# Patient Record
Sex: Female | Born: 1960
Health system: Southern US, Community
[De-identification: ages and names within clinical notes are randomized; demographics above are authoritative.]

## PROBLEM LIST (undated history)

## (undated) DIAGNOSIS — F419 Anxiety disorder, unspecified: Secondary | ICD-10-CM

## (undated) DIAGNOSIS — S060X9A Concussion with loss of consciousness of unspecified duration, initial encounter: Secondary | ICD-10-CM

## (undated) DIAGNOSIS — G259 Extrapyramidal and movement disorder, unspecified: Secondary | ICD-10-CM

## (undated) DIAGNOSIS — G35 Multiple sclerosis: Secondary | ICD-10-CM

## (undated) DIAGNOSIS — S060XAA Concussion with loss of consciousness status unknown, initial encounter: Secondary | ICD-10-CM

## (undated) DIAGNOSIS — Z923 Personal history of irradiation: Secondary | ICD-10-CM

## (undated) DIAGNOSIS — Z800 Family history of malignant neoplasm of digestive organs: Secondary | ICD-10-CM

## (undated) DIAGNOSIS — Z803 Family history of malignant neoplasm of breast: Secondary | ICD-10-CM

## (undated) DIAGNOSIS — F32A Depression, unspecified: Secondary | ICD-10-CM

## (undated) DIAGNOSIS — M797 Fibromyalgia: Secondary | ICD-10-CM

## (undated) DIAGNOSIS — T4145XA Adverse effect of unspecified anesthetic, initial encounter: Secondary | ICD-10-CM

## (undated) DIAGNOSIS — G35D Multiple sclerosis, unspecified: Secondary | ICD-10-CM

## (undated) DIAGNOSIS — G629 Polyneuropathy, unspecified: Secondary | ICD-10-CM

## (undated) DIAGNOSIS — C50919 Malignant neoplasm of unspecified site of unspecified female breast: Secondary | ICD-10-CM

## (undated) DIAGNOSIS — Z8041 Family history of malignant neoplasm of ovary: Secondary | ICD-10-CM

## (undated) DIAGNOSIS — H539 Unspecified visual disturbance: Secondary | ICD-10-CM

## (undated) DIAGNOSIS — T8859XA Other complications of anesthesia, initial encounter: Secondary | ICD-10-CM

## (undated) DIAGNOSIS — M810 Age-related osteoporosis without current pathological fracture: Secondary | ICD-10-CM

## (undated) DIAGNOSIS — F329 Major depressive disorder, single episode, unspecified: Secondary | ICD-10-CM

## (undated) HISTORY — DX: Unspecified visual disturbance: H53.9

## (undated) HISTORY — DX: Family history of malignant neoplasm of breast: Z80.3

## (undated) HISTORY — DX: Family history of malignant neoplasm of digestive organs: Z80.0

## (undated) HISTORY — DX: Depression, unspecified: F32.A

## (undated) HISTORY — DX: Anxiety disorder, unspecified: F41.9

## (undated) HISTORY — DX: Family history of malignant neoplasm of ovary: Z80.41

## (undated) HISTORY — PX: FOOT SURGERY: SHX648

## (undated) HISTORY — DX: Major depressive disorder, single episode, unspecified: F32.9

## (undated) HISTORY — PX: KNEE ARTHROSCOPY: SUR90

## (undated) HISTORY — DX: Extrapyramidal and movement disorder, unspecified: G25.9

## (undated) HISTORY — DX: Polyneuropathy, unspecified: G62.9

## (undated) HISTORY — PX: BREAST LUMPECTOMY: SHX2

## (undated) HISTORY — PX: FRACTURE SURGERY: SHX138

---

## 2000-07-09 ENCOUNTER — Other Ambulatory Visit: Admission: RE | Admit: 2000-07-09 | Discharge: 2000-07-09 | Payer: Self-pay | Admitting: Obstetrics and Gynecology

## 2001-11-25 ENCOUNTER — Other Ambulatory Visit: Admission: RE | Admit: 2001-11-25 | Discharge: 2001-11-25 | Payer: Self-pay | Admitting: Obstetrics and Gynecology

## 2002-12-08 ENCOUNTER — Other Ambulatory Visit: Admission: RE | Admit: 2002-12-08 | Discharge: 2002-12-08 | Payer: Self-pay | Admitting: Obstetrics & Gynecology

## 2003-08-17 ENCOUNTER — Encounter
Admission: RE | Admit: 2003-08-17 | Discharge: 2003-08-17 | Payer: Self-pay | Admitting: Physical Medicine and Rehabilitation

## 2003-09-13 ENCOUNTER — Encounter: Admission: RE | Admit: 2003-09-13 | Discharge: 2003-09-13 | Payer: Self-pay | Admitting: Neurology

## 2004-07-14 ENCOUNTER — Encounter: Admission: RE | Admit: 2004-07-14 | Discharge: 2004-10-12 | Payer: Self-pay | Admitting: Neurology

## 2004-10-11 ENCOUNTER — Encounter: Admission: RE | Admit: 2004-10-11 | Discharge: 2004-10-11 | Payer: Self-pay | Admitting: Neurology

## 2005-01-20 ENCOUNTER — Encounter: Admission: RE | Admit: 2005-01-20 | Discharge: 2005-01-20 | Payer: Self-pay | Admitting: Family Medicine

## 2005-07-27 ENCOUNTER — Encounter: Admission: RE | Admit: 2005-07-27 | Discharge: 2005-07-27 | Payer: Self-pay | Admitting: Endocrinology

## 2005-08-07 ENCOUNTER — Encounter: Admission: RE | Admit: 2005-08-07 | Discharge: 2005-08-07 | Payer: Self-pay | Admitting: Orthopedic Surgery

## 2005-08-25 ENCOUNTER — Encounter: Admission: RE | Admit: 2005-08-25 | Discharge: 2005-08-25 | Payer: Self-pay | Admitting: Neurology

## 2007-05-29 ENCOUNTER — Encounter: Admission: RE | Admit: 2007-05-29 | Discharge: 2007-05-29 | Payer: Self-pay | Admitting: Specialist

## 2009-10-18 ENCOUNTER — Encounter: Admission: RE | Admit: 2009-10-18 | Discharge: 2009-10-18 | Payer: Self-pay | Admitting: Family Medicine

## 2010-02-03 ENCOUNTER — Encounter
Admission: RE | Admit: 2010-02-03 | Discharge: 2010-02-03 | Payer: Self-pay | Source: Home / Self Care | Attending: Family Medicine | Admitting: Family Medicine

## 2010-06-12 ENCOUNTER — Other Ambulatory Visit (HOSPITAL_BASED_OUTPATIENT_CLINIC_OR_DEPARTMENT_OTHER): Payer: Self-pay | Admitting: Endocrinology

## 2010-06-12 DIAGNOSIS — E049 Nontoxic goiter, unspecified: Secondary | ICD-10-CM

## 2010-06-18 ENCOUNTER — Ambulatory Visit (HOSPITAL_BASED_OUTPATIENT_CLINIC_OR_DEPARTMENT_OTHER)
Admission: RE | Admit: 2010-06-18 | Discharge: 2010-06-18 | Disposition: A | Discharge status: Home / Self Care | Payer: 59 | Source: Ambulatory Visit | Attending: Endocrinology | Admitting: Endocrinology

## 2010-06-18 DIAGNOSIS — E049 Nontoxic goiter, unspecified: Secondary | ICD-10-CM

## 2010-07-28 ENCOUNTER — Emergency Department (HOSPITAL_BASED_OUTPATIENT_CLINIC_OR_DEPARTMENT_OTHER): Payer: BC Managed Care – PPO

## 2010-07-28 ENCOUNTER — Emergency Department (HOSPITAL_BASED_OUTPATIENT_CLINIC_OR_DEPARTMENT_OTHER)
Admission: EM | Admit: 2010-07-28 | Discharge: 2010-07-28 | Disposition: A | Discharge status: Home / Self Care | Payer: 59 | Attending: Emergency Medicine | Admitting: Emergency Medicine

## 2010-07-28 ENCOUNTER — Emergency Department (INDEPENDENT_AMBULATORY_CARE_PROVIDER_SITE_OTHER): Payer: 59

## 2010-07-28 ENCOUNTER — Encounter: Payer: Self-pay | Admitting: *Deleted

## 2010-07-28 DIAGNOSIS — S42253A Displaced fracture of greater tuberosity of unspecified humerus, initial encounter for closed fracture: Secondary | ICD-10-CM

## 2010-07-28 DIAGNOSIS — M25519 Pain in unspecified shoulder: Secondary | ICD-10-CM

## 2010-07-28 DIAGNOSIS — W010XXA Fall on same level from slipping, tripping and stumbling without subsequent striking against object, initial encounter: Secondary | ICD-10-CM | POA: Insufficient documentation

## 2010-07-28 DIAGNOSIS — W19XXXA Unspecified fall, initial encounter: Secondary | ICD-10-CM

## 2010-07-28 DIAGNOSIS — S42209A Unspecified fracture of upper end of unspecified humerus, initial encounter for closed fracture: Secondary | ICD-10-CM | POA: Insufficient documentation

## 2010-07-28 DIAGNOSIS — S42202A Unspecified fracture of upper end of left humerus, initial encounter for closed fracture: Secondary | ICD-10-CM

## 2010-07-28 HISTORY — DX: Multiple sclerosis, unspecified: G35.D

## 2010-07-28 HISTORY — DX: Multiple sclerosis: G35

## 2010-07-28 MED ORDER — OXYCODONE-ACETAMINOPHEN 5-325 MG PO TABS: 1.0000 | ORAL_TABLET | Freq: Four times a day (QID) | ORAL | Status: AC | PRN

## 2010-07-28 MED ORDER — OXYCODONE-ACETAMINOPHEN 5-325 MG PO TABS
1.0000 | ORAL_TABLET | Freq: Once | ORAL | Status: AC
  Administered 2010-07-28: 1 via ORAL

## 2010-07-28 MED ORDER — OXYCODONE-ACETAMINOPHEN 5-325 MG PO TABS
ORAL_TABLET | ORAL | Status: AC
  Administered 2010-07-28: 1 via ORAL
  Filled 2010-07-28: qty 1

## 2010-07-28 NOTE — ED Provider Notes (Signed)
History     Chief Complaint  Patient presents with  . Fall   Patient is a 50 y.o. female presenting with fall. The history is provided by the patient.  Fall The accident occurred yesterday. The fall occurred while walking. The point of impact was the left shoulder. The pain is moderate. She was ambulatory at the scene. There was no drug use involved in the accident. Pertinent negatives include no visual change and no numbness. She has tried nothing for the symptoms.    Past Medical History  Diagnosis Date  . Multiple sclerosis     Past Surgical History  Procedure Date  . Knee arthroscopy     Family History  Problem Relation Age of Onset  . Cancer Mother   . Hypertension Mother   . Cancer Father   . Hyperlipidemia Father   . Diabetes Father     History  Substance Use Topics  . Smoking status: Current Everyday Smoker -- 0.5 packs/day  . Smokeless tobacco: Not on file  . Alcohol Use: 1.8 oz/week    3 Shots of liquor per week    OB History    Grav Para Term Preterm Abortions TAB SAB Ect Mult Living                  Review of Systems  Constitutional: Negative for activity change.  HENT: Negative.   Respiratory: Negative.   Cardiovascular: Negative.   Gastrointestinal: Negative.   Musculoskeletal: Positive for gait problem.  Skin: Negative.   Neurological: Positive for weakness. Negative for numbness.  Hematological: Negative.   Psychiatric/Behavioral: Negative.    Chronic weakness and unsteady gait as result of ms Physical Exam  BP 150/89  Pulse 91  Temp 98.1 F (36.7 C)  Resp 16  Wt 155 lb (70.308 kg)  SpO2 95%  Physical Exam  Constitutional: She appears well-developed and well-nourished.  HENT:  Head: Normocephalic and atraumatic.  Eyes: Conjunctivae are normal. Pupils are equal, round, and reactive to light.  Neck: Neck supple. No tracheal deviation present. No thyromegaly present.  Cardiovascular: Normal rate and regular rhythm.   No murmur  heard. Pulmonary/Chest: Effort normal and breath sounds normal.  Abdominal: Soft. Bowel sounds are normal. She exhibits no distension. There is no tenderness.  Musculoskeletal: Normal range of motion. She exhibits no edema and no tenderness.       Left shoulder: She exhibits tenderness. She exhibits no swelling and no deformity.  Neurological: She is alert. Coordination normal.  Skin: Skin is warm and dry. No rash noted.  Psychiatric: She has a normal mood and affect.    ED Course  Procedures  MDM Pain improvedafter percocet Xray avulsion fx , reviewed by me . discussedwith pt and spouse pt does bnot ewant sling due to pastt frozen shouloder .paln rx percocet f/u dr Audrie Lia, MD 07/28/10 1006

## 2010-07-28 NOTE — ED Notes (Signed)
Pt c/o fall x 1 day ago left shoulder/ arm pain  HX MS

## 2010-11-13 ENCOUNTER — Emergency Department (INDEPENDENT_AMBULATORY_CARE_PROVIDER_SITE_OTHER): Payer: 59

## 2010-11-13 ENCOUNTER — Emergency Department (HOSPITAL_BASED_OUTPATIENT_CLINIC_OR_DEPARTMENT_OTHER)
Admission: EM | Admit: 2010-11-13 | Discharge: 2010-11-13 | Disposition: A | Discharge status: Home / Self Care | Payer: 59 | Attending: Emergency Medicine | Admitting: Emergency Medicine

## 2010-11-13 ENCOUNTER — Encounter (HOSPITAL_BASED_OUTPATIENT_CLINIC_OR_DEPARTMENT_OTHER): Payer: Self-pay | Admitting: *Deleted

## 2010-11-13 DIAGNOSIS — G319 Degenerative disease of nervous system, unspecified: Secondary | ICD-10-CM

## 2010-11-13 DIAGNOSIS — R42 Dizziness and giddiness: Secondary | ICD-10-CM

## 2010-11-13 DIAGNOSIS — W19XXXA Unspecified fall, initial encounter: Secondary | ICD-10-CM

## 2010-11-13 DIAGNOSIS — S0990XA Unspecified injury of head, initial encounter: Secondary | ICD-10-CM

## 2010-11-13 DIAGNOSIS — Y92009 Unspecified place in unspecified non-institutional (private) residence as the place of occurrence of the external cause: Secondary | ICD-10-CM | POA: Insufficient documentation

## 2010-11-13 DIAGNOSIS — G35 Multiple sclerosis: Secondary | ICD-10-CM | POA: Insufficient documentation

## 2010-11-13 DIAGNOSIS — M533 Sacrococcygeal disorders, not elsewhere classified: Secondary | ICD-10-CM

## 2010-11-13 DIAGNOSIS — Z79899 Other long term (current) drug therapy: Secondary | ICD-10-CM | POA: Insufficient documentation

## 2010-11-13 DIAGNOSIS — F17200 Nicotine dependence, unspecified, uncomplicated: Secondary | ICD-10-CM | POA: Insufficient documentation

## 2010-11-13 MED ORDER — OXYCODONE-ACETAMINOPHEN 5-325 MG PO TABS
1.0000 | ORAL_TABLET | Freq: Once | ORAL | Status: AC
  Administered 2010-11-13: 1 via ORAL
  Filled 2010-11-13: qty 1

## 2010-11-13 MED ORDER — OXYCODONE-ACETAMINOPHEN 5-325 MG PO TABS: 1.0000 | ORAL_TABLET | Freq: Four times a day (QID) | ORAL | Status: AC | PRN

## 2010-11-13 NOTE — ED Provider Notes (Addendum)
History     CSN: [id number] Arrival date & time: 11/13/2010  9:37 AM   First MD Initiated Contact with Patient 11/13/10 [phone]      Chief Complaint  Patient presents with  . Fall  . Dizziness    (Consider location/radiation/quality/duration/timing/severity/associated sxs/prior treatment) HPI Patient presents after a fall yesterday with complaints of posterior head pain as well as pain in her tailbone. Patient has a history of multiple sclerosis. She received her flu shot yesterday and felt dizzy and lightheaded and fatigued afterwards. She took a nap and when standing up from her nap she stumbled and fell onto her bottom and then fell back hitting the back of her head. She does not think she lost consciousness although husband state she seemed somewhat confused after the incident. She did not faint. No vomiting or seizure activity. She does not have any weakness or numbness in her arms or legs. No changes in her vision. Dizziness and fatigue have improved which is a reaction she had had in the past to the flu shot.  Past Medical History  Diagnosis Date  . Multiple sclerosis     Past Surgical History  Procedure Date  . Knee arthroscopy     Family History  Problem Relation Age of Onset  . Cancer Mother   . Hypertension Mother   . Cancer Father   . Hyperlipidemia Father   . Diabetes Father     History  Substance Use Topics  . Smoking status: Current Everyday Smoker -- 0.5 packs/day  . Smokeless tobacco: Not on file  . Alcohol Use: 1.8 oz/week    3 Shots of liquor per week    OB History    Grav Para Term Preterm Abortions TAB SAB Ect Mult Living                  Review of Systems ROS reviewed and otherwise negative except for mentioned in HPI  Allergies  Zithromax and Ultram  Home Medications   Current Outpatient Rx  Name Route Sig Dispense Refill  . CALCIUM CITRATE-VITAMIN D 315-200 MG-UNIT PO TABS Oral Take 1 tablet by mouth 2 (two) times daily.      Marland Kitchen  VITAMIN D 1000 UNITS PO TABS Oral Take 1,000 Units by mouth daily.      Marland Kitchen CLONAZEPAM 1 MG PO TABS Oral Take 1 mg by mouth at bedtime.     Marland Kitchen ESCITALOPRAM OXALATE 5 MG PO TABS Oral Take 2.5 mg by mouth daily.     . MULTIVITAMINS PO TABS Oral Take 1 tablet by mouth daily.        BP 143/93  Pulse 87  Temp 97.9 F (36.6 C)  Resp 18  SpO2 99% Vitals reviewed Physical Exam Physical Examination: General appearance - alert, well appearing, and in no distress Mental status - alert, oriented to person, place, and time Eyes - pupils equal and reactive, extraocular eye movements intact Neck - supple, no significant adenopathy Chest - clear to auscultation, no wheezes, rales or rhonchi, symmetric air entry Heart - normal rate, regular rhythm, normal S1, S2, no murmurs, rubs, clicks or gallops Abdomen - soft, nontender, nondistended, no masses or organomegaly Back exam - no tttp in midline of cervical, thoracic or lumbar spine, ttp over coccyx Neurological - alert, oriented, normal speech, no focal findings or movement disorder noted, motor and sensory grossly normal bilaterally Musculoskeletal - no joint tenderness, deformity or swelling Extremities - peripheral pulses normal, no pedal edema, no clubbing or cyanosis,  FROM of all joints without point tenderness or significant pain Skin - normal coloration and turgor, no rashes, no suspicious skin lesions noted  ED Course  Procedures (including critical care time)  Labs Reviewed - No data to display Dg Sacrum/coccyx  11/13/2010  *RADIOLOGY REPORT*  Clinical Data: Status post fall.  Pain.  SACRUM AND COCCYX - 2+ VIEW  Comparison: None.  Findings: Imaged bones, joints and soft tissues appear normal.  IMPRESSION: Negative exam.  Original Report Authenticated By: Bernadene Bell. Maricela Curet, M.D.   Ct Head Wo Contrast  11/13/2010  *RADIOLOGY REPORT*  Clinical Data: Fall, dizziness.  History multiple sclerosis.  CT HEAD WITHOUT CONTRAST  Technique:   Contiguous axial images were obtained from the base of the skull through the vertex without contrast.  Comparison: Brain MRI 10/11/2004.  Findings: There is some hypoattenuation in the subcortical deep white matter. The brain is mildly atrophic.  No evidence of acute infarction, hemorrhage, mass lesion, mass effect, midline shift or abnormal extra-axial fluid collection.  No hydrocephalus or pneumocephalus.  The calvarium is intact.  IMPRESSION:  1.  No acute finding. 2.  Deep white matter hypoattenuation could be due to multiple sclerosis and/or chronic microvascular ischemic change.  Cortical atrophy again noted.  Original Report Authenticated By: Bernadene Bell. Maricela Curet, M.D.     No diagnosis found.    MDM  Pt with hx of MS s/p fall presenting with pain in tailbone and posterior head.  Xrays/CT show chronic findings only, no acute injuries.  Pt given percocet for pain control.  Discharged with strict return precautions.  Pt agreeable with plan.        Ethelda Chick, MD 11/13/10 1157  Ethelda Chick, MD 11/13/10 [phone]

## 2010-11-13 NOTE — ED Notes (Signed)
Pt amb to room 4 with quick steady gait smiling in nad.  Pt reports feeling dizzy since getting her flu shot yesterday, with ha, diarrhea, states this is same reaction she has had to flu shot in the past.  Pt states that she had a trip and fall after taking a nap yesterday afternoon onto her bottom.  Denies any loc, cc is neck pain, coccyx pain and right shoulder pain.

## 2011-05-30 ENCOUNTER — Encounter (HOSPITAL_COMMUNITY): Admission: EM | Disposition: A | Discharge status: Home / Self Care | Payer: Self-pay | Source: Home / Self Care

## 2011-05-30 ENCOUNTER — Encounter (HOSPITAL_COMMUNITY): Payer: Self-pay | Admitting: *Deleted

## 2011-05-30 ENCOUNTER — Emergency Department (HOSPITAL_COMMUNITY)
Admission: EM | Admit: 2011-05-30 | Discharge: 2011-05-30 | Disposition: A | Discharge status: Home / Self Care | Payer: 59 | Attending: Orthopedic Surgery | Admitting: Orthopedic Surgery

## 2011-05-30 ENCOUNTER — Encounter (HOSPITAL_COMMUNITY): Payer: Self-pay | Admitting: Anesthesiology

## 2011-05-30 ENCOUNTER — Emergency Department (HOSPITAL_COMMUNITY): Payer: 59

## 2011-05-30 ENCOUNTER — Emergency Department (HOSPITAL_COMMUNITY): Payer: 59 | Admitting: Anesthesiology

## 2011-05-30 DIAGNOSIS — W010XXA Fall on same level from slipping, tripping and stumbling without subsequent striking against object, initial encounter: Secondary | ICD-10-CM | POA: Insufficient documentation

## 2011-05-30 DIAGNOSIS — G35 Multiple sclerosis: Secondary | ICD-10-CM | POA: Insufficient documentation

## 2011-05-30 DIAGNOSIS — F17200 Nicotine dependence, unspecified, uncomplicated: Secondary | ICD-10-CM | POA: Insufficient documentation

## 2011-05-30 DIAGNOSIS — S61209A Unspecified open wound of unspecified finger without damage to nail, initial encounter: Secondary | ICD-10-CM | POA: Insufficient documentation

## 2011-05-30 HISTORY — DX: Multiple sclerosis: G35

## 2011-05-30 HISTORY — DX: Multiple sclerosis, unspecified: G35.D

## 2011-05-30 HISTORY — PX: I & D EXTREMITY: SHX5045

## 2011-05-30 SURGERY — IRRIGATION AND DEBRIDEMENT EXTREMITY
Anesthesia: General | Site: Hand | Wound class: Contaminated

## 2011-05-30 MED ORDER — ONDANSETRON HCL 4 MG/2ML IJ SOLN
INTRAMUSCULAR | Status: DC | PRN
  Administered 2011-05-30: 4 mg via INTRAVENOUS

## 2011-05-30 MED ORDER — CEFAZOLIN SODIUM 1-5 GM-% IV SOLN
1.0000 g | Freq: Once | INTRAVENOUS | Status: AC
  Administered 2011-05-30 (×2): 1 g via INTRAVENOUS
  Filled 2011-05-30: qty 50

## 2011-05-30 MED ORDER — FENTANYL CITRATE 0.05 MG/ML IJ SOLN
INTRAMUSCULAR | Status: DC | PRN
  Administered 2011-05-30: 100 ug via INTRAVENOUS

## 2011-05-30 MED ORDER — 0.9 % SODIUM CHLORIDE (POUR BTL) OPTIME
TOPICAL | Status: DC | PRN
  Administered 2011-05-30: 900 mL

## 2011-05-30 MED ORDER — OXYCODONE-ACETAMINOPHEN 5-325 MG PO TABS: ORAL_TABLET | ORAL | Status: AC

## 2011-05-30 MED ORDER — PROPOFOL 10 MG/ML IV BOLUS
INTRAVENOUS | Status: DC | PRN
  Administered 2011-05-30: 200 mg via INTRAVENOUS

## 2011-05-30 MED ORDER — MEPERIDINE HCL 50 MG/ML IJ SOLN: 6.2500 mg | INTRAMUSCULAR | Status: DC | PRN

## 2011-05-30 MED ORDER — LIDOCAINE HCL (CARDIAC) 20 MG/ML IV SOLN
INTRAVENOUS | Status: DC | PRN
  Administered 2011-05-30: 75 mg via INTRAVENOUS

## 2011-05-30 MED ORDER — MIDAZOLAM HCL 5 MG/5ML IJ SOLN
INTRAMUSCULAR | Status: DC | PRN
  Administered 2011-05-30: 2 mg via INTRAVENOUS

## 2011-05-30 MED ORDER — LACTATED RINGERS IV SOLN: INTRAVENOUS | Status: DC

## 2011-05-30 MED ORDER — PROMETHAZINE HCL 25 MG/ML IJ SOLN: 6.2500 mg | INTRAMUSCULAR | Status: DC | PRN

## 2011-05-30 MED ORDER — HYDROMORPHONE HCL PF 1 MG/ML IJ SOLN
INTRAMUSCULAR | Status: AC
  Filled 2011-05-30: qty 1

## 2011-05-30 MED ORDER — CHLORHEXIDINE GLUCONATE 4 % EX LIQD
60.0000 mL | Freq: Once | CUTANEOUS | Status: DC
  Filled 2011-05-30: qty 60

## 2011-05-30 MED ORDER — LACTATED RINGERS IV SOLN
INTRAVENOUS | Status: DC | PRN
  Administered 2011-05-30: 06:00:00 via INTRAVENOUS

## 2011-05-30 MED ORDER — TETANUS-DIPHTH-ACELL PERTUSSIS 5-2.5-18.5 LF-MCG/0.5 IM SUSP
0.5000 mL | Freq: Once | INTRAMUSCULAR | Status: AC
  Administered 2011-05-30: 0.5 mL via INTRAMUSCULAR
  Filled 2011-05-30: qty 0.5

## 2011-05-30 MED ORDER — HYDROMORPHONE HCL PF 1 MG/ML IJ SOLN
0.2500 mg | INTRAMUSCULAR | Status: DC | PRN
  Administered 2011-05-30: 0.5 mg via INTRAVENOUS

## 2011-05-30 MED ORDER — CEPHALEXIN 500 MG PO CAPS: 500.0000 mg | ORAL_CAPSULE | Freq: Four times a day (QID) | ORAL | Status: AC

## 2011-05-30 MED ORDER — CEFAZOLIN SODIUM 1-5 GM-% IV SOLN
INTRAVENOUS | Status: AC
  Filled 2011-05-30: qty 50

## 2011-05-30 MED ORDER — FENTANYL CITRATE 0.05 MG/ML IJ SOLN
50.0000 ug | Freq: Once | INTRAMUSCULAR | Status: AC
  Administered 2011-05-30: 50 ug via INTRAVENOUS
  Filled 2011-05-30: qty 2

## 2011-05-30 MED ORDER — BUPIVACAINE HCL (PF) 0.5 % IJ SOLN
50.0000 mL | Freq: Once | INTRAMUSCULAR | Status: DC
  Filled 2011-05-30: qty 30

## 2011-05-30 SURGICAL SUPPLY — 47 items
BAG SPEC THK2 15X12 ZIP CLS (MISCELLANEOUS)
BAG ZIPLOCK 12X15 (MISCELLANEOUS) ×1 IMPLANT
BANDAGE ELASTIC 3 VELCRO ST LF (GAUZE/BANDAGES/DRESSINGS) ×6 IMPLANT
BANDAGE ELASTIC 4 VELCRO ST LF (GAUZE/BANDAGES/DRESSINGS) ×1 IMPLANT
BANDAGE GAUZE ELAST BULKY 4 IN (GAUZE/BANDAGES/DRESSINGS) ×3 IMPLANT
BNDG COHESIVE 4X5 TAN STRL (GAUZE/BANDAGES/DRESSINGS) IMPLANT
CANISTER SUCTION 2500CC (MISCELLANEOUS) ×1 IMPLANT
CLEANER TIP ELECTROSURG 2X2 (MISCELLANEOUS) ×1 IMPLANT
CLOTH BEACON ORANGE TIMEOUT ST (SAFETY) ×3 IMPLANT
CORDS BIPOLAR (ELECTRODE) IMPLANT
CUFF TOURN SGL QUICK 18 (TOURNIQUET CUFF) ×3 IMPLANT
CUFF TOURN SGL QUICK 24 (TOURNIQUET CUFF)
CUFF TRNQT CYL 24X4X40X1 (TOURNIQUET CUFF) ×1 IMPLANT
DRAIN PENROSE 18X1/2 LTX STRL (DRAIN) IMPLANT
DRAPE POUCH INSTRU U-SHP 10X18 (DRAPES) IMPLANT
DRAPE SURG 17X11 SM STRL (DRAPES) ×3 IMPLANT
DRSG PAD ABDOMINAL 8X10 ST (GAUZE/BANDAGES/DRESSINGS) ×1 IMPLANT
ELECT REM PT RETURN 9FT ADLT (ELECTROSURGICAL) ×3
ELECTRODE REM PT RTRN 9FT ADLT (ELECTROSURGICAL) ×2 IMPLANT
GAUZE PACKING IODOFORM 1/2 (PACKING) ×1 IMPLANT
GAUZE PACKING IODOFORM 1/4X5 (PACKING) IMPLANT
GAUZE XEROFORM 1X8 LF (GAUZE/BANDAGES/DRESSINGS) ×6 IMPLANT
GAUZE XEROFORM 5X9 LF (GAUZE/BANDAGES/DRESSINGS) ×1 IMPLANT
GLOVE SURG ORTHO 8.0 STRL STRW (GLOVE) ×3 IMPLANT
GOWN STRL REIN XL XLG (GOWN DISPOSABLE) ×3 IMPLANT
HANDPIECE INTERPULSE COAX TIP (DISPOSABLE)
KIT BASIN OR (CUSTOM PROCEDURE TRAY) ×3 IMPLANT
LOOP VESSEL MAXI BLUE (MISCELLANEOUS) ×3 IMPLANT
MANIFOLD NEPTUNE II (INSTRUMENTS) ×5 IMPLANT
PACK LOWER EXTREMITY WL (CUSTOM PROCEDURE TRAY) ×3 IMPLANT
PAD CAST 3X4 CTTN HI CHSV (CAST SUPPLIES) ×1 IMPLANT
PAD CAST 4YDX4 CTTN HI CHSV (CAST SUPPLIES) IMPLANT
PADDING CAST ABS 3INX4YD NS (CAST SUPPLIES) ×1
PADDING CAST ABS COTTON 3X4 (CAST SUPPLIES) ×1 IMPLANT
PADDING CAST COTTON 3X4 STRL (CAST SUPPLIES) ×3
PADDING CAST COTTON 4X4 STRL (CAST SUPPLIES)
SET CYSTO W/LG BORE CLAMP LF (SET/KITS/TRAYS/PACK) IMPLANT
SET HNDPC FAN SPRY TIP SCT (DISPOSABLE) ×1 IMPLANT
SPLINT PLASTER EXTRA FAST 3X15 (CAST SUPPLIES) ×1
SPLINT PLASTER GYPS XFAST 3X15 (CAST SUPPLIES) ×1 IMPLANT
SPONGE GAUZE 4X4 12PLY (GAUZE/BANDAGES/DRESSINGS) ×3 IMPLANT
SUT ETHILON 4 0 PS 2 18 (SUTURE) ×3 IMPLANT
SUT SILK 4 0 PS 2 (SUTURE) ×1 IMPLANT
SYR 20CC LL (SYRINGE) ×1 IMPLANT
SYR CONTROL 10ML LL (SYRINGE) ×1 IMPLANT
TRAY PREP A LATEX SAFE STRL (SET/KITS/TRAYS/PACK) ×3 IMPLANT
UNDERPAD 30X30 INCONTINENT (UNDERPADS AND DIAPERS) ×3 IMPLANT

## 2011-05-30 NOTE — ED Notes (Signed)
Marcaine at bedside 

## 2011-05-30 NOTE — Discharge Instructions (Signed)

## 2011-05-30 NOTE — ED Provider Notes (Signed)
History     CSN: [id number]  Arrival date & time 05/30/11  0032   First MD Initiated Contact with Patient 05/30/11 on arrival    Chief Complaint  Patient presents with  . Finger Injury    (Consider location/radiation/quality/duration/timing/severity/associated sxs/prior treatment) HPI Is a 51 year old white female with a history of multiple sclerosis. She tripped on new carpet this morning and in trying to catch herself hyperextended her fingers of the left hand. She is complaining of moderate pain in the left middle finger with laceration to the palm exposing the tendon. Tendon function is intact but limited due to pain. She was given fentanyl IV by EMS prior to arrival with partial relief of the pain. She denies other injury. She states her multiple sclerosis leaves her weak on the left greater than right.  Past Medical History  Diagnosis Date  . Multiple sclerosis   . MS (multiple sclerosis)     Past Surgical History  Procedure Date  . Knee arthroscopy     Family History  Problem Relation Age of Onset  . Cancer Mother   . Hypertension Mother   . Cancer Father   . Hyperlipidemia Father   . Diabetes Father     History  Substance Use Topics  . Smoking status: Current Everyday Smoker -- 0.5 packs/day  . Smokeless tobacco: Not on file  . Alcohol Use: 0.0 oz/week     couple drnks a day    OB History    Grav Para Term Preterm Abortions TAB SAB Ect Mult Living                  Review of Systems  All other systems reviewed and are negative.    Allergies  Ppa-mma express; Zithromax; and Ultram  Home Medications   Current Outpatient Rx  Name Route Sig Dispense Refill  . CLONAZEPAM 1 MG PO TABS Oral Take 2 mg by mouth at bedtime.     Marland Kitchen ESCITALOPRAM OXALATE 5 MG PO TABS Oral Take 5 mg by mouth daily.      BP 134/89  Pulse 79  Temp(Src) 97.8 F (36.6 C) (Oral)  Resp 13  SpO2 96%  Physical Exam General: Well-developed, well-nourished female in no acute  distress; appearance consistent with age of record HENT: normocephalic, atraumatic Eyes: pupils equal round and reactive to light; extraocular muscles intact Neck: supple Heart: regular rate and rhythm Lungs: Normal respiratory effort and excursion Abdomen: soft; nondistended Extremities: Tenderness and decreased range of motion left middle finger with deformity of proximal interphalangeal joint and laceration over the palmar proximal interphalangeal joint of the left little finger, tendon is exposed but appears intact; sensation capillary refill intact distally Neurologic: Awake, alert and oriented; left-sided weakness; no facial droop Skin: Warm and dry Psychiatric: Normal mood and affect    ED Course  Procedures (including critical care time)     MDM  Nursing notes and vitals signs, including pulse oximetry, reviewed.  Summary of this visit's results, reviewed by myself:  Labs:  No results found for this or any previous visit.  Imaging Studies: Dg Hand Complete Left  05/30/2011  *RADIOLOGY REPORT*  Clinical Data: Pain across the second through fifth digits after fall.  LEFT HAND - COMPLETE 3+ VIEW  Comparison: None.  Findings: There is a posterior/ulnar dislocation of the middle phalanx of the left third finger with respect to the proximal phalanx.  No associated fractures are appreciated. There is impaction of the anterior surface of the middle phalanx  onto the proximal phalangeal head and occult fracture is not excluded. There is suggestion of a small bone fragment over the volar plate of the middle phalanx of the second finger and of the fourth finger which could represent avulsion fractures or old accessory ossicles. There was a arthritic changes are demonstrated in the proximal interphalangeal joints of the second and fifth fingers. Degenerative changes also appreciated in the distal interphalangeal joints and in the carpus.  No focal bone lesion.  No abnormal periosteal  reaction.  IMPRESSION: Dislocation of the left third proximal interphalangeal joint with impaction but without definite fracture appreciated. Possible volar plate avulsion fractures versus old accessory ossicles at the second and fourth proximal interphalangeal joints. Degenerative and erosive arthritic changes.  Original Report Authenticated By: Marlon Pel, M.D.   3:08 AM Patient given Ancef 1 g IV for open dislocation. Rings removed from ring finger in anticipation of swelling associated with avulsion fracture. Dr. Merlyn Lot to see patient in ED.         Hanley Seamen, MD 05/30/11 [phone]

## 2011-05-30 NOTE — ED Notes (Signed)
Awaiting had surgeon to come consult

## 2011-05-30 NOTE — ED Notes (Signed)
Pt has ms, tripped on new carpet tried to catch herself and middle finger was bent backwards, open wound to finger, tendon exposed. Pt rates pain 3/10.  Pt received 100 mcg Fentanyl per EMS

## 2011-05-30 NOTE — ED Notes (Signed)
Rings removed. Hand surgeon has been called.

## 2011-05-30 NOTE — Transfer of Care (Signed)
Immediate Anesthesia Transfer of Care Note  Patient: Taylor Hancock  Procedure(s) Performed: Procedure(s) (LRB): OPEN REDUCTION INTERNAL FIXATION (ORIF) METACARPAL (FINGER) FRACTURE (Left)  Patient Location: PACU  Anesthesia Type: General  Level of Consciousness: awake, alert , oriented and patient cooperative  Airway & Oxygen Therapy: Patient Spontanous Breathing and Patient connected to face mask oxygen  Post-op Assessment: Report given to PACU RN and Post -op Vital signs reviewed and stable  Post vital signs: Reviewed and stable  Complications: No apparent anesthesia complications

## 2011-05-30 NOTE — ED Notes (Signed)
[id number]<AV> Expected date:<BR> Expected time:12:18 AM<BR> Means of arrival:<BR> Comments:<BR> M221 - 51yoF Fall, finger deformity

## 2011-05-30 NOTE — H&P (Signed)
Taylor Hancock is an 51 y.o. female.   Chief Complaint: left index,long,ring,small finger pain HPI: 51 yo rhd female fell onto left hand bending fingers backwards.  Pain in all fingers.  Wound at volar aspect of long finger pip joint.  Reports previous small finger dislocation but no other previous injuries and no other injuries at this time.    Past Medical History  Diagnosis Date  . Multiple sclerosis   . MS (multiple sclerosis)     Past Surgical History  Procedure Date  . Knee arthroscopy     Family History  Problem Relation Age of Onset  . Cancer Mother   . Hypertension Mother   . Cancer Father   . Hyperlipidemia Father   . Diabetes Father    Social History:  reports that she has been smoking.  She does not have any smokeless tobacco history on file. She reports that she drinks alcohol. She reports that she does not use illicit drugs.  Allergies:  Allergies  Allergen Reactions  . Ppa-Mma Express ([place]) Nausea And Vomiting    Blood in vomit and back felt like it was breaking.   . Zithromax (Azithromycin) Other (See Comments)    abd cramping  . Ultram (Tramadol Hcl) Anxiety     (Not in a hospital admission)  No results found for this or any previous visit (from the past 48 hour(s)).  Dg Hand Complete Left  05/30/2011  *RADIOLOGY REPORT*  Clinical Data: Pain across the second through fifth digits after fall.  LEFT HAND - COMPLETE 3+ VIEW  Comparison: None.  Findings: There is a posterior/ulnar dislocation of the middle phalanx of the left third finger with respect to the proximal phalanx.  No associated fractures are appreciated. There is impaction of the anterior surface of the middle phalanx onto the proximal phalangeal head and occult fracture is not excluded. There is suggestion of a small bone fragment over the volar plate of the middle phalanx of the second finger and of the fourth finger which could represent avulsion fractures or old accessory ossicles. There  was a arthritic changes are demonstrated in the proximal interphalangeal joints of the second and fifth fingers. Degenerative changes also appreciated in the distal interphalangeal joints and in the carpus.  No focal bone lesion.  No abnormal periosteal reaction.  IMPRESSION: Dislocation of the left third proximal interphalangeal joint with impaction but without definite fracture appreciated. Possible volar plate avulsion fractures versus old accessory ossicles at the second and fourth proximal interphalangeal joints. Degenerative and erosive arthritic changes.  Original Report Authenticated By: Marlon Pel, M.D.     A comprehensive review of systems was negative.  Blood pressure 121/84, pulse 78, temperature 98.2 F (36.8 C), temperature source Oral, resp. rate 16, SpO2 100.00%.  General appearance: alert, cooperative and appears stated age Head: Normocephalic, without obvious abnormality, atraumatic Neck: supple, symmetrical, trachea midline Resp: clear to auscultation bilaterally Cardio: regular rate and rhythm GI: soft, non-tender; bowel sounds normal; no masses,  no organomegaly Extremities: light touch sensation and capillary refill intact all digits.  states some fingers have decreased sensation but this is normal for her due to MS.  +epl/fpl/io.  right hand no wounds no ttp.  left hand wound volar to pip of long.  no other wounds.  ttp pip of all fingers.  ttp mp of long.   Pulses: 2+ and symmetric Skin: as above Neurologic: Grossly normal Incision/Wound: As above  Assessment/Plan Left long finger open pip dislocation.  Hyperextension  injuries to index, ring, small.  Recommend OR for I&D and reduction of open dislocation.  Risks, benefits, and alternatives of surgery were discussed and the patient agrees with the plan of care.   Harjot Dibello R 05/30/2011, 5:32 AM

## 2011-05-30 NOTE — Op Note (Signed)
Dictation [phone]

## 2011-05-30 NOTE — Anesthesia Preprocedure Evaluation (Signed)
Anesthesia Evaluation  Patient identified by MRN, date of birth, ID band Patient awake    Reviewed: Allergy & Precautions, H&P , NPO status , Patient's Chart, lab work & pertinent test results  Airway Mallampati: II TM Distance: >3 FB Neck ROM: Full    Dental No notable dental hx.    Pulmonary neg pulmonary ROS, Current Smoker,  breath sounds clear to auscultation  Pulmonary exam normal       Cardiovascular negative cardio ROS  Rhythm:Regular Rate:Normal     Neuro/Psych Multiple sclerosis negative neurological ROS  negative psych ROS   GI/Hepatic negative GI ROS, Neg liver ROS,   Endo/Other  negative endocrine ROS  Renal/GU negative Renal ROS  negative genitourinary   Musculoskeletal negative musculoskeletal ROS (+)   Abdominal   Peds negative pediatric ROS (+)  Hematology negative hematology ROS (+)   Anesthesia Other Findings Multiple crowns   Reproductive/Obstetrics negative OB ROS                           Anesthesia Physical Anesthesia Plan  ASA: II  Anesthesia Plan: General   Post-op Pain Management:    Induction: Intravenous  Airway Management Planned: LMA  Additional Equipment:   Intra-op Plan:   Post-operative Plan: Extubation in OR  Informed Consent: I have reviewed the patients History and Physical, chart, labs and discussed the procedure including the risks, benefits and alternatives for the proposed anesthesia with the patient or authorized representative who has indicated his/her understanding and acceptance.   Dental advisory given  Plan Discussed with: CRNA  Anesthesia Plan Comments:         Anesthesia Quick Evaluation

## 2011-05-30 NOTE — ED Notes (Signed)
OR contacted, will be bringing pt in 10 min.

## 2011-05-30 NOTE — Anesthesia Postprocedure Evaluation (Signed)
  Anesthesia Post-op Note  Patient: Taylor Hancock  Procedure(s) Performed: Procedure(s) (LRB): OPEN REDUCTION INTERNAL FIXATION (ORIF) METACARPAL (FINGER) FRACTURE (Left)  Patient Location: PACU  Anesthesia Type: General  Level of Consciousness: awake and alert   Airway and Oxygen Therapy: Patient Spontanous Breathing  Post-op Pain: mild  Post-op Assessment: Post-op Vital signs reviewed, Patient's Cardiovascular Status Stable, Respiratory Function Stable, Patent Airway and No signs of Nausea or vomiting  Post-op Vital Signs: stable  Complications: No apparent anesthesia complications

## 2011-05-31 NOTE — Op Note (Signed)
NAME:  Taylor Hancock, Taylor Hancock               ACCOUNT NO.:  [id number]  MEDICAL RECORD NO.:  [id number]  LOCATION:  [facility]                         FACILITY:  [facility]  PHYSICIAN:  Betha Loa, MD        DATE OF BIRTH:  08-Aug-1960  DATE OF PROCEDURE:  05/30/2011 DATE OF DISCHARGE:                              OPERATIVE REPORT   PREOPERATIVE DIAGNOSIS:  Left long finger open proximal interphalangeal joint dislocation and hyperextension injuries to index and ring finger proximal interphalangeal joints.  POSTOPERATIVE DIAGNOSIS:  Left long finger open proximal interphalangeal joint dislocation and hyperextension injuries to index and ring finger proximal interphalangeal joints.  PROCEDURE:  Irrigation & debridement and reduction of left long finger open proximal interphalangeal joint dislocation.  SURGEON:  Betha Loa, MD.  ASSISTANTS:  None.  ANESTHESIA:  General.  IV FLUIDS:  Per anesthesia flow sheet.  ESTIMATED BLOOD LOSS:  Minimal.  COMPLICATIONS:  None.  SPECIMENS:  None.  TOURNIQUET TIME:  11 minutes.  DISPOSITION:  Stable to PACU.  INDICATIONS:  Ms. Frary is a 51 year old right-hand dominant female who states that yesterday evening she fell onto her left hand and caused hyperextension injury to her fingers.  She has pain in all the fingers. She was brought to the [facility] Emergency Department, where she was found to have an open PIP joint dislocation of the long finger.  I was consulted for management of the injury.  On examination, she states she has normal sensation in the fingertips.  She has brisk capillary refill. There is a wound in the volar aspect of the PIP joint of the long finger.  She is tender at the PIP joints of the index, long, and ring fingers, and at the MP joint of the long finger on the ulnar side.  I discussed with Ms. Bruni the nature of her injury.  I recommended going to the operating room for irrigation, debridement, and reduction of the open  dislocation.  Risks, benefits, and alternatives of doing so were discussed including risk of blood loss; infection; damage to nerves, vessels, tendons, ligaments, bone; failure of procedure; need for additional procedures; complications with wound healing; continued pain; nonunion; malunion; stiffness.  She voiced understanding of these risks and elected to proceed.  OPERATIVE COURSE:  After being identified preoperatively by myself, the patient and I agreed upon the procedure and site of procedure.  The surgical site was marked.  The risks, benefits, and alternatives of surgery were reviewed and she wished to proceed.  Surgical consent had been signed.  Her Ancef was redosed.  She was transferred to the operating room and placed on the operating table in supine position with the left upper extremity on an arm board.  General anesthesia was induced by anesthesiologist.  Left upper extremity was prepped and draped in normal sterile orthopedic fashion.  Surgical pause was performed between surgeons, anesthesia, and operating staff, and all were in agreement with the patient, procedure, and site of procedure. Tourniquet at the proximal aspect of the extremity was inflated to 250 mmHg after exsanguination of the limb with an Esmarch bandage. Reduction of the PIP joint of the long finger was easily performed.  The  tendons were retracted.  The volar plate had avulsed from the base of the middle phalanx.  There was no gross contamination.  The PIP joint was copiously irrigated with 1000 mL of sterile saline by bulb syringe. A vessel loop drain was placed in the wound.  Two 4-0 nylon sutures were placed in interrupted fashion in the wound.  This approximated the skin edges well.  The wound was dressed with sterile Xeroform and 4x4s, and wrapped with a Kerlix bandage.  A dorsal blocking splint was placed over the long, ring, and small fingers with the MPs flexed and the PIP joints slightly  flexed.  This was wrapped with Kerlix and Ace bandage. Tourniquet was deflated at 11 minute.  The fingertips were pink with brisk capillary refill after deflation of the tourniquet.  Operative drapes were broken down.  The patient was awoken from anesthesia safely. She was transferred back to the stretcher and taken to PACU in stable condition.  I will give her Percocet 5/325 one to two p.o. q.6 hours p.r.n. pain, dispensed #40, and Keflex 500 mg p.o. t.i.d. x7 days.  I will see her back in the office within a week.     Betha Loa, MD     KK/MEDQ  D:  05/30/2011  T:  05/31/2011  Job:  [id number]

## 2011-06-04 ENCOUNTER — Encounter (HOSPITAL_COMMUNITY): Payer: Self-pay | Admitting: Orthopedic Surgery

## 2012-02-27 ENCOUNTER — Emergency Department (HOSPITAL_BASED_OUTPATIENT_CLINIC_OR_DEPARTMENT_OTHER)
Admission: EM | Admit: 2012-02-27 | Discharge: 2012-02-27 | Disposition: A | Discharge status: Home / Self Care | Payer: 59 | Attending: Emergency Medicine | Admitting: Emergency Medicine

## 2012-02-27 ENCOUNTER — Encounter (HOSPITAL_BASED_OUTPATIENT_CLINIC_OR_DEPARTMENT_OTHER): Payer: Self-pay | Admitting: *Deleted

## 2012-02-27 DIAGNOSIS — Y929 Unspecified place or not applicable: Secondary | ICD-10-CM | POA: Insufficient documentation

## 2012-02-27 DIAGNOSIS — F17200 Nicotine dependence, unspecified, uncomplicated: Secondary | ICD-10-CM | POA: Insufficient documentation

## 2012-02-27 DIAGNOSIS — S61211A Laceration without foreign body of left index finger without damage to nail, initial encounter: Secondary | ICD-10-CM

## 2012-02-27 DIAGNOSIS — Z8669 Personal history of other diseases of the nervous system and sense organs: Secondary | ICD-10-CM | POA: Insufficient documentation

## 2012-02-27 DIAGNOSIS — S61209A Unspecified open wound of unspecified finger without damage to nail, initial encounter: Secondary | ICD-10-CM | POA: Insufficient documentation

## 2012-02-27 DIAGNOSIS — Y939 Activity, unspecified: Secondary | ICD-10-CM | POA: Insufficient documentation

## 2012-02-27 DIAGNOSIS — W260XXA Contact with knife, initial encounter: Secondary | ICD-10-CM | POA: Insufficient documentation

## 2012-02-27 DIAGNOSIS — Z79899 Other long term (current) drug therapy: Secondary | ICD-10-CM | POA: Insufficient documentation

## 2012-02-27 HISTORY — DX: Concussion with loss of consciousness of unspecified duration, initial encounter: S06.0X9A

## 2012-02-27 HISTORY — DX: Concussion with loss of consciousness status unknown, initial encounter: S06.0XAA

## 2012-02-27 MED ORDER — HYDROCODONE-ACETAMINOPHEN 5-325 MG PO TABS: 2.0000 | ORAL_TABLET | ORAL | Status: DC | PRN

## 2012-02-27 NOTE — ED Notes (Signed)
ED PAC at bedside preparing for laceration repair.

## 2012-02-27 NOTE — ED Provider Notes (Signed)
History     CSN: [id number]  Arrival date & time 02/27/12  1353   First MD Initiated Contact with Patient 02/27/12 1522      Chief Complaint  Patient presents with  . Laceration    (Consider location/radiation/quality/duration/timing/severity/associated sxs/prior treatment) Patient is a 52 y.o. female presenting with skin laceration. The history is provided by the patient. No language interpreter was used.  Laceration Location:  Hand Hand laceration location:  L finger Length (cm):  0.5 Depth:  Cutaneous Bleeding: venous   Time since incident:  2 hours Laceration mechanism:  Knife Pain details:    Quality:  Aching and throbbing   Severity:  Moderate   Progression:  Worsening Foreign body present:  No foreign bodies Relieved by:  Nothing Worsened by:  Nothing tried Tetanus status:  Up to date   Past Medical History  Diagnosis Date  . Multiple sclerosis   . MS (multiple sclerosis)   . Concussion     Past Surgical History  Procedure Laterality Date  . Knee arthroscopy    . I&d extremity  05/30/2011    Procedure: IRRIGATION AND DEBRIDEMENT EXTREMITY;  Surgeon: Tami Ribas, MD;  Location: [facility];  Service: Orthopedics;;  Incision and drainage of open Proximal interphalangeal joint with closed reduction of Proximal interphalangeal  joint left long finger  . Fracture surgery      Family History  Problem Relation Age of Onset  . Cancer Mother   . Hypertension Mother   . Cancer Father   . Hyperlipidemia Father   . Diabetes Father     History  Substance Use Topics  . Smoking status: Current Every Day Smoker -- 0.50 packs/day  . Smokeless tobacco: Not on file  . Alcohol Use: 0.0 oz/week     Comment: couple drnks a day    OB History   Grav Para Term Preterm Abortions TAB SAB Ect Mult Living                  Review of Systems  Skin: Positive for wound.  All other systems reviewed and are negative.    Allergies  Ppa-mma express; Zithromax; and  Ultram  Home Medications   Current Outpatient Rx  Name  Route  Sig  Dispense  Refill  . clonazePAM (KLONOPIN) 1 MG tablet   Oral   Take 2 mg by mouth at bedtime.          Marland Kitchen escitalopram (LEXAPRO) 5 MG tablet   Oral   Take 5 mg by mouth daily.           BP 120/79  Pulse 78  Temp(Src) 97.9 F (36.6 C) (Oral)  Resp 20  Ht 5\' 5"  (1.651 m)  Wt 136 lb (61.689 kg)  BMI 22.63 kg/m2  SpO2 99%  Physical Exam  Nursing note and vitals reviewed. Constitutional: She appears well-developed and well-nourished.  HENT:  Head: Normocephalic.  Musculoskeletal: She exhibits tenderness.  0.5 mm laceration palmar aspect left index finger, oozing  Neurological: She is alert. She has normal reflexes.  Skin: Skin is warm.  Psychiatric: She has a normal mood and affect.    ED Course  LACERATION REPAIR Date/Time: 02/27/2012 4:23 PM Performed by: Elson Areas Authorized by: Elson Areas Consent: Verbal consent obtained. Consent given by: patient Patient identity confirmed: verbally with patient Time out: Immediately prior to procedure a "time out" was called to verify the correct patient, procedure, equipment, support staff and site/side marked as required. Body area:  upper extremity Location details: left index finger Laceration length: 0.5 cm Foreign bodies: no foreign bodies Tendon involvement: none Nerve involvement: none Local anesthetic: lidocaine 1% without epinephrine Patient sedated: no Preparation: Patient was prepped and draped in the usual sterile fashion. Debridement: none Degree of undermining: none Skin closure: 5-0 Prolene Number of sutures: 2 Technique: simple Approximation: loose Approximation difficulty: simple Patient tolerance: Patient tolerated the procedure well with no immediate complications.   (including critical care time)  Labs Reviewed - No data to display No results found.   1. Laceration of left index finger w/o foreign body w/o damage  to nail       MDM  RX hydrocodone 10.   Suture removal in 8 days        Lonia [place], [place] 02/27/12 1627

## 2012-02-27 NOTE — ED Notes (Signed)
Pt states she cut her left index finger with a knife earlier today. Approx 3/4-1 in lac per pt. Bandaged PTA. Bleeding controlled.

## 2012-02-29 NOTE — ED Provider Notes (Signed)
History/physical exam/procedure(s) were performed by non-physician practitioner and as supervising physician I was immediately available for consultation/collaboration. I have reviewed all notes and am in agreement with care and plan.   Hilario Quarry, MD 02/29/12 [phone]

## 2012-08-13 ENCOUNTER — Emergency Department (INDEPENDENT_AMBULATORY_CARE_PROVIDER_SITE_OTHER)
Admission: EM | Admit: 2012-08-13 | Discharge: 2012-08-13 | Disposition: A | Discharge status: Home / Self Care | Payer: 59 | Source: Home / Self Care | Attending: Family Medicine | Admitting: Family Medicine

## 2012-08-13 ENCOUNTER — Encounter: Payer: Self-pay | Admitting: Emergency Medicine

## 2012-08-13 DIAGNOSIS — L719 Rosacea, unspecified: Secondary | ICD-10-CM

## 2012-08-13 DIAGNOSIS — L710 Perioral dermatitis: Secondary | ICD-10-CM

## 2012-08-13 MED ORDER — DOXYCYCLINE HYCLATE 50 MG PO CAPS: 50.0000 mg | ORAL_CAPSULE | Freq: Two times a day (BID) | ORAL | Status: DC

## 2012-08-13 NOTE — ED Notes (Signed)
Reports onset of rash on face about 2 weeks ago; now itching and burning. Did have Shingles vaccination one year ago. Had similar rash years ago that was dx impetigo. Currently has MS.

## 2012-08-13 NOTE — ED Provider Notes (Signed)
CSN: [id number]     Arrival date & time 08/13/12  1642 History     First MD Initiated Contact with Patient 08/13/12 1712     Chief Complaint  Patient presents with  . Rash      HPI Comments: Reports onset of rash on face about 2 weeks ago; now itching and burning. Did have Shingles vaccination one year ago. Had similar rash years ago that was diagnosed as impetigo. Currently has MS.  Patient is a 52 y.o. female presenting with rash. The history is provided by the patient.  Rash Pain location: face. Pain quality comment:  Itching Pain severity:  Mild Onset quality:  Gradual Duration:  2 weeks Timing:  Constant Progression:  Worsening Chronicity:  New Relieved by:  Nothing Ineffective treatments: Neosporin cream and 1% hydrocortisone cream. Associated symptoms: no chills, no fever and no sore throat     Past Medical History  Diagnosis Date  . Multiple sclerosis   . MS (multiple sclerosis)   . Concussion    Past Surgical History  Procedure Laterality Date  . Knee arthroscopy    . I&d extremity  05/30/2011    Procedure: IRRIGATION AND DEBRIDEMENT EXTREMITY;  Surgeon: Tami Ribas, MD;  Location: [facility];  Service: Orthopedics;;  Incision and drainage of open Proximal interphalangeal joint with closed reduction of Proximal interphalangeal  joint left long finger  . Fracture surgery     Family History  Problem Relation Age of Onset  . Cancer Mother   . Hypertension Mother   . Cancer Father   . Hyperlipidemia Father   . Diabetes Father    History  Substance Use Topics  . Smoking status: Former Smoker -- 0.50 packs/day  . Smokeless tobacco: Not on file  . Alcohol Use: No     Comment: couple drnks a day   OB History   Grav Para Term Preterm Abortions TAB SAB Ect Mult Living                 Review of Systems  Constitutional: Negative for fever and chills.  HENT: Negative for sore throat.   Skin: Positive for rash.  All other systems reviewed and are  negative.    Allergies  Ppa-mma express; Zithromax; and Ultram  Home Medications   Current Outpatient Rx  Name  Route  Sig  Dispense  Refill  . cyclobenzaprine (FLEXERIL) 5 MG tablet   Oral   Take 5 mg by mouth 3 (three) times daily as needed for muscle spasms.         Marland Kitchen tiZANidine (ZANAFLEX) 4 MG capsule   Oral   Take 4 mg by mouth 3 (three) times daily.         . clonazePAM (KLONOPIN) 1 MG tablet   Oral   Take 2 mg by mouth at bedtime.          Marland Kitchen doxycycline (VIBRAMYCIN) 50 MG capsule   Oral   Take 1 capsule (50 mg total) by mouth 2 (two) times daily.   30 capsule   1   . escitalopram (LEXAPRO) 5 MG tablet   Oral   Take 5 mg by mouth daily.         Marland Kitchen HYDROcodone-acetaminophen (NORCO/VICODIN) 5-325 MG per tablet   Oral   Take 2 tablets by mouth every 4 (four) hours as needed for pain.   10 tablet   0    BP 116/76  Pulse 94  Temp(Src) 98.1 F (36.7 C) (Oral)  Resp 16  Ht 5\' 4"  (1.626 m)  Wt 143 lb (64.864 kg)  BMI 24.53 kg/m2  SpO2 97% Physical Exam  Nursing note and vitals reviewed. Constitutional: She appears well-developed and well-nourished. No distress.  HENT:  Head: Normocephalic and atraumatic.    Mouth/Throat: Oropharynx is clear and moist.  Beneath nose and peri-oral area is macular erythema with several 1mm pustules around mouth.  No swelling or tenderness.  Eyes: Conjunctivae are normal. Pupils are equal, round, and reactive to light.  Neck: Neck supple.  Lymphadenopathy:    She has no cervical adenopathy.    ED Course   Procedures  none  Labs Reviewed  WOUND CULTURE pending    1. Perioral dermatitis     MDM  Wound culture pending from small pustule left chin. Begin doxycycline 50mg  bid. Followup dermatologist if not improved 2 weeks.  Lattie Haw, MD 08/20/12 [phone]

## 2012-08-15 ENCOUNTER — Telehealth: Payer: Self-pay | Admitting: *Deleted

## 2012-08-15 LAB — WOUND CULTURE: Gram Stain: NONE SEEN

## 2013-10-21 ENCOUNTER — Emergency Department (HOSPITAL_BASED_OUTPATIENT_CLINIC_OR_DEPARTMENT_OTHER): Payer: 59

## 2013-10-21 ENCOUNTER — Encounter (HOSPITAL_BASED_OUTPATIENT_CLINIC_OR_DEPARTMENT_OTHER): Payer: Self-pay | Admitting: Emergency Medicine

## 2013-10-21 ENCOUNTER — Emergency Department (HOSPITAL_BASED_OUTPATIENT_CLINIC_OR_DEPARTMENT_OTHER)
Admission: EM | Admit: 2013-10-21 | Discharge: 2013-10-21 | Disposition: A | Discharge status: Home / Self Care | Payer: 59 | Attending: Emergency Medicine | Admitting: Emergency Medicine

## 2013-10-21 DIAGNOSIS — Z79899 Other long term (current) drug therapy: Secondary | ICD-10-CM | POA: Insufficient documentation

## 2013-10-21 DIAGNOSIS — Z96659 Presence of unspecified artificial knee joint: Secondary | ICD-10-CM | POA: Insufficient documentation

## 2013-10-21 DIAGNOSIS — Z8669 Personal history of other diseases of the nervous system and sense organs: Secondary | ICD-10-CM | POA: Insufficient documentation

## 2013-10-21 DIAGNOSIS — Y9389 Activity, other specified: Secondary | ICD-10-CM | POA: Insufficient documentation

## 2013-10-21 DIAGNOSIS — S92152A Displaced avulsion fracture (chip fracture) of left talus, initial encounter for closed fracture: Secondary | ICD-10-CM

## 2013-10-21 DIAGNOSIS — Z792 Long term (current) use of antibiotics: Secondary | ICD-10-CM | POA: Insufficient documentation

## 2013-10-21 DIAGNOSIS — Z87891 Personal history of nicotine dependence: Secondary | ICD-10-CM | POA: Diagnosis not present

## 2013-10-21 DIAGNOSIS — W1839XA Other fall on same level, initial encounter: Secondary | ICD-10-CM | POA: Diagnosis not present

## 2013-10-21 DIAGNOSIS — S99912A Unspecified injury of left ankle, initial encounter: Secondary | ICD-10-CM | POA: Diagnosis present

## 2013-10-21 DIAGNOSIS — Y9289 Other specified places as the place of occurrence of the external cause: Secondary | ICD-10-CM | POA: Insufficient documentation

## 2013-10-21 NOTE — ED Notes (Signed)
Pt has hx of falls due to MS. Pt fell last night, pain to left ankle/foot. Pt has nerve stimulator on leg due to foot drop.

## 2013-10-21 NOTE — ED Notes (Signed)
Pt refused to wear cam walker once applied stating she felt like it would make her fall.

## 2013-10-21 NOTE — ED Provider Notes (Signed)
Patient given cam walker for talus fracture. Has MS, refusing to wear cam walker or be splinted. I counseled her on risks of not having it immobilized, but she states immobilization will cause difficulty with gait and likely increase her fall risk. Counseled on a brace and f/u with Ortho.  Evelina Bucy, MD 10/21/13 [phone]

## 2013-10-21 NOTE — ED Provider Notes (Signed)
CSN: [id number]     Arrival date & time 10/21/13  1344 History   First MD Initiated Contact with Patient 10/21/13 1350     Chief Complaint  Patient presents with  . Fall  . Ankle Pain     (Consider location/radiation/quality/duration/timing/severity/associated sxs/prior Treatment) Patient is a 53 y.o. female presenting with fall and ankle pain.  Fall This is a recurrent problem. Episode onset: 12 hours ago. Episode frequency: once. Associated symptoms comments: Left ankle and left great toe pain. Exacerbated by: standing, walking.  Ankle Pain Location:  Toe and ankle Ankle location:  L ankle Toe location:  L big toe Pain details:    Quality:  Sharp and shooting   Severity:  Moderate   Onset quality:  Sudden   Duration:  12 hours   Timing:  Constant   Progression:  Unchanged   Past Medical History  Diagnosis Date  . Multiple sclerosis   . MS (multiple sclerosis)   . Concussion    Past Surgical History  Procedure Laterality Date  . Knee arthroscopy    . I&d extremity  05/30/2011    Procedure: IRRIGATION AND DEBRIDEMENT EXTREMITY;  Surgeon: Tennis Must, MD;  Location: [facility];  Service: Orthopedics;;  Incision and drainage of open Proximal interphalangeal joint with closed reduction of Proximal interphalangeal  joint left long finger  . Fracture surgery     Family History  Problem Relation Age of Onset  . Cancer Mother   . Hypertension Mother   . Cancer Father   . Hyperlipidemia Father   . Diabetes Father    History  Substance Use Topics  . Smoking status: Former Smoker -- 0.50 packs/day  . Smokeless tobacco: Not on file  . Alcohol Use: No     Comment: couple drnks a day   OB History   Grav Para Term Preterm Abortions TAB SAB Ect Mult Living                 Review of Systems  All other systems reviewed and are negative.     Allergies  Ppa-mma express; Zithromax; and Ultram  Home Medications   Prior to Admission medications   Medication Sig Start  Date End Date Taking? Authorizing Provider  clonazePAM (KLONOPIN) 1 MG tablet Take 2 mg by mouth at bedtime.     Historical Provider, MD  cyclobenzaprine (FLEXERIL) 5 MG tablet Take 5 mg by mouth 3 (three) times daily as needed for muscle spasms.    Historical Provider, MD  doxycycline (VIBRAMYCIN) 50 MG capsule Take 1 capsule (50 mg total) by mouth 2 (two) times daily. 08/13/12   Kandra Nicolas, MD  escitalopram (LEXAPRO) 5 MG tablet Take 5 mg by mouth daily.    Historical Provider, MD  HYDROcodone-acetaminophen (NORCO/VICODIN) 5-325 MG per tablet Take 2 tablets by mouth every 4 (four) hours as needed for pain. 02/27/12   Fransico Meadow, PA-C  tiZANidine (ZANAFLEX) 4 MG capsule Take 4 mg by mouth 3 (three) times daily.    Historical Provider, MD   BP 143/92  Pulse 97  Temp(Src) 98.1 F (36.7 C) (Oral)  Resp 17  SpO2 99% Physical Exam  Nursing note and vitals reviewed. Constitutional: She is oriented to person, place, and time. She appears well-developed and well-nourished. No distress.  HENT:  Head: Normocephalic and atraumatic. Head is without raccoon's eyes and without Battle's sign.  Nose: Nose normal.  Eyes: Conjunctivae and EOM are normal. Pupils are equal, round, and reactive to light.  No scleral icterus.  Neck: No spinous process tenderness and no muscular tenderness present.  Cardiovascular: Normal rate, regular rhythm, normal heart sounds and intact distal pulses.   No murmur heard. Pulmonary/Chest: Effort normal and breath sounds normal. She has no rales. She exhibits no tenderness.  Abdominal: Soft. There is no tenderness. There is no rebound and no guarding.  Musculoskeletal: Normal range of motion. She exhibits no edema.       Left ankle: She exhibits normal range of motion, no swelling, no ecchymosis, no deformity and normal pulse. Tenderness.       Thoracic back: She exhibits no tenderness and no bony tenderness.       Lumbar back: She exhibits no tenderness and no bony  tenderness.       Left foot: She exhibits tenderness (left great toe). She exhibits normal range of motion.  No evidence of trauma to extremities, except as noted.  2+ distal pulses.    Neurological: She is alert and oriented to person, place, and time.  Skin: Skin is warm and dry. No rash noted.  Psychiatric: She has a normal mood and affect.    ED Course  Procedures (including critical care time) Labs Review Labs Reviewed - No data to display  Imaging Review Dg Ankle Complete Left  10/21/2013   CLINICAL DATA:  Patient has history of falling due to multiple sclerosis, fell last night and is having anterior left ankle pain and left foot pain  EXAM: LEFT ANKLE COMPLETE - 3+ VIEW  COMPARISON:  None.  FINDINGS: There is a tiny bone fragment off the anterior dorsal aspect of the talus, with evidence of underlying cortical disruption. This could represent a tiny avulsion fracture. There are no other abnormalities at the level of the left ankle.  IMPRESSION: Findings suggest minimally displaced fracture off of the dorsal anterior aspect of the talus.   Electronically Signed   By: Skipper Cliche M.D.   On: 10/21/2013 14:39   Dg Foot Complete Left  10/21/2013   CLINICAL DATA:  History of fall pseudo multiple sclerosis. Fell last evening, now with anterior left ankle and foot pain. Initial encounter.  EXAM: LEFT FOOT - COMPLETE 3+ VIEW  COMPARISON:  Left ankle radiographs - 10/21/2013  FINDINGS: There is a small displaced osseous fragment adjacent to the cranial aspect of the tailored beak which may represent an age-indeterminate avulsive injury.  An otherwise, no acute displaced fractures are identified. Joint spaces are preserved. No erosions. Regional soft tissues appear normal. No radiopaque foreign body. No definite ankle joint effusion.  IMPRESSION: Osseous fragment adjacent to the cranial aspect of the talar beak favored to the sequela of age-indeterminate avulsive injury. Correlation for point  tenderness at this location is recommended.   Electronically Signed   By: Sandi Mariscal M.D.   On: 10/21/2013 14:44  All radiology studies independently viewed by me.      EKG Interpretation None      MDM   Final diagnoses:  Avulsion fracture of left talus, closed, initial encounter    53 yo female with a hx of MS presenting after a fall last night.  She tripped over her left foot while she wasn't wearing her nerve stimulator.  No other injuries identified on history or exam.  No head trauma, no LOC, no neck pain.  Plan plain films.  She prefers to defer pain meds until after xrays.  Plain films show talus avulsion.  Discussed fracture with Dr. Doran Durand, who rec'd cam walker,  WBAT, and outpatient follow up.    Artis Delay, MD 10/21/13 1536

## 2014-02-07 ENCOUNTER — Encounter: Payer: Self-pay | Admitting: Neurology

## 2014-02-07 ENCOUNTER — Ambulatory Visit (INDEPENDENT_AMBULATORY_CARE_PROVIDER_SITE_OTHER): Payer: 59 | Admitting: Neurology

## 2014-02-07 VITALS — BP 152/90 | HR 72 | Resp 16 | Ht 64.0 in | Wt 158.8 lb

## 2014-02-07 DIAGNOSIS — M21372 Foot drop, left foot: Secondary | ICD-10-CM | POA: Insufficient documentation

## 2014-02-07 DIAGNOSIS — G35 Multiple sclerosis: Secondary | ICD-10-CM | POA: Diagnosis not present

## 2014-02-07 DIAGNOSIS — G801 Spastic diplegic cerebral palsy: Secondary | ICD-10-CM | POA: Insufficient documentation

## 2014-02-07 DIAGNOSIS — R261 Paralytic gait: Secondary | ICD-10-CM | POA: Insufficient documentation

## 2014-02-07 DIAGNOSIS — R200 Anesthesia of skin: Secondary | ICD-10-CM | POA: Insufficient documentation

## 2014-02-07 MED ORDER — CLONAZEPAM 1 MG PO TABS: 4.0000 mg | ORAL_TABLET | Freq: Every day | ORAL | Status: DC

## 2014-02-07 NOTE — Progress Notes (Signed)
[facility]  PATIENT: Taylor Hancock DOB: 1960-07-30  REFERRING CLINICIAN: Shirline Frees   HISTORY FROM: Patient REASON FOR VISIT: PPMS   HISTORICAL  CHIEF COMPLAINT:  Chief Complaint  Patient presents with  . Multiple Sclerosis    Sts. balance/gait are worse. Mult. falls.  Also sts. she is experiencing ms hugs upper abd. Sts. voice is worse.  Dexterity is worse./fim    HISTORY OF PRESENT ILLNESS:  Taylor Hancock is a 54 year old woman who was diagnosed with multiple sclerosis in 2005 after presenting with several years of worsening gait. Initially, she was diagnosed with relapsing remitting MS and was placed on Betaseron. She had difficulty tolerating Betaseron but did continue to take it. Her first MRI after the diagnosis was unchanged from her previous one. A couple years later she transferred her care to me. After review of her time course of disease, it was apparent that she had a progressive MS and not a relapsing form of MS. Therefore, the Betaseron was discontinued.   She has continued to have progressive difficulties with her gait, especially left leg weakness and spasticity.  She uses a Rollator but still falls about 2 or 3 times most months. Her left leg is painful due to frequent cramps. The cramping spasticity seems to be worse at night and she often wakes up with a fair amount of pain.  She is currently on tizanidine 4 mg at night, 10 mg of cyclobenzaprine and 4 mg of clonazepam at bedtime. She had difficulty tolerating baclofen in the past.   Due to risk of side effects, she did not want to try Ampyra in the past. Her left foot drop does better with a Walk-Aide Neurostimulator on the left.    She notes dysesthetic sensations in her trunk. She also has some numbness in her hands. Numbness is fairly constant. She sometimes drops items out of her hands. Typing is more difficult. Both of her feet feel numb.     She has noted a mild change in her voice. She  does not note any definite dysphagia although she gets a tickle sensation in the throat at times.  She has noted more fatigue and depression. The fatigue is both mental and physical. She has more difficulty focusing, especially as the day goes on. She also gets tired more easily than she used to with physical tasks. Mood has been worse over the past few months. She notes some changes at work and more work has been pushed her way.She is not able to stand as long as she used to and starts slouching over after a few minutes.    She has never tried anything for her fatigue.  She feels more forgetful, especially when she is more tired. She has some difficulties with verbal fluency. She also appears to have some decrease in executive function and has more difficulty with planning and doing complicated tasks.  She denies major difficulties with her vision but does note that she has reduced depth perception compared to the past. She does not feel safe driving at night anymore.  She reports urinary frequency and urgency. She has occasional incontinence when she can't get to the bathroom in time. She also notes that she sometimes hesitancy and she is not sure that she completely empties    REVIEW OF SYSTEMS:  Constitutional: No fevers, chills, sweats, or change in appetite Eyes: No visual changes, double vision, eye pain Ear, nose and throat: No hearing loss, ear pain, nasal congestion, sore  throat.  Change in voice Cardiovascular: No chest pain, palpitations Respiratory:  No shortness of breath at rest or with exertion.   No wheezes GastrointestinaI: No nausea, vomiting, diarrhea, abdominal pain, fecal incontinence Genitourinary:  No dysuria, urinary retention or frequency.  No nocturia. Musculoskeletal:  No neck pain, back pain Integumentary: No rash, pruritus, skin lesions Neurological: as above Psychiatric: Increased depression   Endocrine: No palpitations, diaphoresis, change in appetite, change  in weigh or increased thirst Hematologic/Lymphatic:  No anemia, purpura, petechiae. Allergic/Immunologic: No itchy/runny eyes, nasal congestion, recent allergic reactions, rashes  ALLERGIES: Allergies  Allergen Reactions  . Ppa-Mma Express [Alitraq] Nausea And Vomiting    Blood in vomit and back felt like it was breaking.   Marland Kitchen Zithromax [Azithromycin] Other (See Comments)    abd cramping  . Ultram [Tramadol Hcl] Anxiety    HOME MEDICATIONS: Outpatient Prescriptions Prior to Visit  Medication Sig Dispense Refill  . clonazePAM (KLONOPIN) 1 MG tablet Take 4 mg by mouth at bedtime.     . cyclobenzaprine (FLEXERIL) 5 MG tablet Take 5 mg by mouth 3 (three) times daily as needed for muscle spasms.    Marland Kitchen escitalopram (LEXAPRO) 5 MG tablet Take 5 mg by mouth daily.    Marland Kitchen HYDROcodone-acetaminophen (NORCO/VICODIN) 5-325 MG per tablet Take 2 tablets by mouth every 4 (four) hours as needed for pain. 10 tablet 0  . tiZANidine (ZANAFLEX) 4 MG capsule Take 4 mg by mouth 3 (three) times daily.    Marland Kitchen doxycycline (VIBRAMYCIN) 50 MG capsule Take 1 capsule (50 mg total) by mouth 2 (two) times daily. 30 capsule 1   No facility-administered medications prior to visit.    PAST MEDICAL HISTORY: Past Medical History  Diagnosis Date  . Multiple sclerosis   . MS (multiple sclerosis)   . Concussion   . Movement disorder   . Neuropathy   . Vision abnormalities     PAST SURGICAL HISTORY: Past Surgical History  Procedure Laterality Date  . Knee arthroscopy    . I&d extremity  05/30/2011    Procedure: IRRIGATION AND DEBRIDEMENT EXTREMITY;  Surgeon: Tennis Must, MD;  Location: [facility];  Service: Orthopedics;;  Incision and drainage of open Proximal interphalangeal joint with closed reduction of Proximal interphalangeal  joint left long finger  . Fracture surgery      FAMILY HISTORY: Family History  Problem Relation Age of Onset  . Cancer Mother   . Hypertension Mother   . Cancer Father   .  Hyperlipidemia Father   . Diabetes Father     SOCIAL HISTORY:  History   Social History  . Marital Status: Married    Spouse Name: N/A    Number of Children: N/A  . Years of Education: N/A   Occupational History  . Not on file.   Social History Main Topics  . Smoking status: Former Smoker -- 0.50 packs/day  . Smokeless tobacco: Not on file  . Alcohol Use: No     Comment: couple drnks a day  . Drug Use: No  . Sexual Activity: Not on file   Other Topics Concern  . Not on file   Social History Narrative     PHYSICAL EXAM  Filed Vitals:   02/07/14 1456  BP: 152/90  Pulse: 72  Resp: 16  Height: 5\' 4"  (1.626 m)  Weight: 158 lb 12.8 oz (72.031 kg)    Body mass index is 27.24 kg/(m^2).   General: The patient is well-developed and well-nourished and in no  acute distress  Eyes:  Funduscopic exam shows normal optic discs and retinal vessels.  Neck: The neck is supple, no carotid bruits are noted.  The neck is nontender.  Respiratory: The respiratory examination is clear.  Cardiovascular: The cardiovascular examination reveals a regular rate and rhythm, no murmurs, gallops or rubs are noted.  Skin: Extremities are without significant edema.  Neurologic Exam  Mental status: The patient is alert and oriented x 3 at the time of the examination. The patient has apparent normal recent and remote memory, with an apparently normal attention span and concentration ability.   Speech is normal.  Cranial nerves: Extraocular movements are full. Pupils are equal, round, and reactive to light and accomodation.  Visual fields are full.  Facial symmetry is present. There is good facial sensation to soft touch bilaterally.Facial strength is normal.  Trapezius and sternocleidomastoid strength is normal. No dysarthria is noted.  The tongue is midline, and the patient has symmetric elevation of the soft palate. No obvious hearing deficits are noted.  Motor:  Muscle bulk is normal. She  has mild right and moderate left increased tone. 4-/5 in the left leg hip flexures and 4 over 5 in the right leg. Strength is 4+ over 5 in the lower left leg.  Sensory: Sensory testing is intact to pinprick, soft touch, vibration sensation, and position sense proximally on all 4 extremities.. However, Truman Hayward she reports decreased sensation to touch and temperature and vibration in the fingertips bilaterally.  Coordination: Cerebellar testing reveals good finger-nose-finger and poor heel-to-shin (worse on left)   Gait and station: Station is stable and gait is spastic on the left with wide stance and reduced stride   She cannot do a tandem walk. Romberg is negative.   Reflexes: Deep tendon reflexes are symmetric and normal in the arms but she has increased reflexes in both legs with nonsustained clonus at the left ankle. The plantar response is extensor on the left.    DIAGNOSTIC DATA (LABS, IMAGING, TESTING) - I reviewed patient records, labs, notes, testing and imaging myself where available.     ASSESSMENT AND PLAN   Multiple sclerosis, primary progressive  Spastic gait  Spastic diplegia  Numbness  Foot drop, left   In summary, Norberta Stobaugh is a 54 year old woman with primary progressive multiple sclerosis manifested mostly by poor gait due to left leg weakness and spasticity. Her  foot drop is helped by a walk-aide neurostimulator device and I will give her a prescription for additional electrodes. She should continue to remain active.  We discussed using a Rollator more as she has had multiple falls. Mood and fatigue are both worse at this visit than they have been in the past. I have asked her to double her Lexapro from 5 mg to 10 mg which she already has.  I offered to write for a stimulant such as Ritalin or Adderall but she prefers not to as she is very concerned that it would affect her sleep at night. has the medication. If mood continues to worsen, I would like her to see  psychiatry. She is advised to go to the [facility] or [facility] emergency room for assessment if she has any suicidal ideation.  She will return to see me in 5-6 months, sooner if she has new or worsening neurologic symptoms.   Richard A. Felecia Shelling, MD, PhD 8/92/1194, 1:74 PM Certified in Neurology, Clinical Neurophysiology, Sleep Medicine, Pain Medicine and Neuroimaging  [facility] [address] [place]  [place], [place] [address] [phone]

## 2014-02-15 ENCOUNTER — Other Ambulatory Visit: Payer: Self-pay

## 2014-02-15 ENCOUNTER — Telehealth: Payer: Self-pay | Admitting: Neurology

## 2014-02-15 DIAGNOSIS — G35 Multiple sclerosis: Secondary | ICD-10-CM

## 2014-02-15 NOTE — Telephone Encounter (Signed)
Patient stated [facility] needs a written Rx for walk aid.  [facility] will not accept note given by Dr. Felecia Shelling.  Please fax to # [phone].  Please call and advise.

## 2014-02-19 ENCOUNTER — Telehealth: Payer: Self-pay | Admitting: Neurology

## 2014-02-19 NOTE — Telephone Encounter (Signed)
We (Cassandra and I) have made mult attempts to add walkaide electrodes to rx list to print out, without success.  Cassandra was able to print out order for dme equipment: walkaide electrodes.  I spoke with Sharyn Lull and advised her that we were able to print order for walk aide electrodes--I faxed this to [facility] at [phone] per her request/fim

## 2014-02-19 NOTE — Telephone Encounter (Signed)
MS patient is calling as she need a Rx for "Walk-aid electrodes", "1 MS-Code G35" and "Foot Drops M21.372".  She stated Dr Felecia Shelling wrote this on plan paper but that [facility] would not accept this as a Rx - they said this needs to be written up differently.  She stated she need pretty quickly or she will not be able to walk and is still trying to work.  Please call

## 2014-02-21 ENCOUNTER — Telehealth: Payer: Self-pay | Admitting: *Deleted

## 2014-02-21 NOTE — Telephone Encounter (Signed)
Spoke with Sharyn Lull and advised I have faxed order for walk aide electrodes to [phone] per her request/fim

## 2014-02-21 NOTE — Telephone Encounter (Signed)
Pt calling back stating that she needs the Rx for walk aide electrodes to be faxed to [phone].  She states she did not receive the last fax on 02/19/14.  Please call and advise.

## 2014-02-26 ENCOUNTER — Telehealth: Payer: Self-pay | Admitting: *Deleted

## 2014-02-26 DIAGNOSIS — G35 Multiple sclerosis: Secondary | ICD-10-CM

## 2014-02-26 NOTE — Telephone Encounter (Signed)
Taylor Hancock is calling for the patient needing an electrode order re faxed to them because they do not have it. Per the previous note it was faxed and that is the correct number. Please advise

## 2014-03-01 NOTE — Telephone Encounter (Signed)
Pt is calling back stating that she needs the order re-faxed for 12 refills for electrodes, she only got one set of electrodes.  Please call and advise

## 2014-03-29 ENCOUNTER — Other Ambulatory Visit: Payer: Self-pay | Admitting: Neurology

## 2014-04-26 ENCOUNTER — Other Ambulatory Visit: Payer: Self-pay | Admitting: Neurology

## 2014-05-26 ENCOUNTER — Other Ambulatory Visit: Payer: Self-pay | Admitting: Neurology

## 2014-05-30 ENCOUNTER — Telehealth: Payer: Self-pay | Admitting: Neurology

## 2014-05-30 MED ORDER — TIZANIDINE HCL 4 MG PO TABS: ORAL_TABLET | ORAL | Status: DC

## 2014-05-30 NOTE — Telephone Encounter (Signed)
Rx has been sent for same dose previously auth on 04/07.  Receipt confirmed by pharmacy.

## 2014-05-30 NOTE — Telephone Encounter (Signed)
Bernerd from [facility] in [place] called requesting a refill on Rx. tiZANidine (ZANAFLEX) 4 MG tablet by the patients request. Please call and advise.

## 2014-07-04 ENCOUNTER — Telehealth: Payer: Self-pay | Admitting: *Deleted

## 2014-07-04 NOTE — Telephone Encounter (Signed)
Error

## 2014-07-30 ENCOUNTER — Other Ambulatory Visit: Payer: Self-pay | Admitting: Neurology

## 2014-07-31 ENCOUNTER — Other Ambulatory Visit: Payer: Self-pay | Admitting: Neurology

## 2014-08-01 ENCOUNTER — Ambulatory Visit (INDEPENDENT_AMBULATORY_CARE_PROVIDER_SITE_OTHER): Payer: 59 | Admitting: Neurology

## 2014-08-01 ENCOUNTER — Telehealth: Payer: Self-pay | Admitting: *Deleted

## 2014-08-01 ENCOUNTER — Encounter: Payer: Self-pay | Admitting: Neurology

## 2014-08-01 VITALS — BP 146/84 | HR 86 | Resp 14 | Ht 64.0 in | Wt 151.0 lb

## 2014-08-01 DIAGNOSIS — R261 Paralytic gait: Secondary | ICD-10-CM | POA: Diagnosis not present

## 2014-08-01 DIAGNOSIS — F329 Major depressive disorder, single episode, unspecified: Secondary | ICD-10-CM | POA: Diagnosis not present

## 2014-08-01 DIAGNOSIS — R5383 Other fatigue: Secondary | ICD-10-CM

## 2014-08-01 DIAGNOSIS — G801 Spastic diplegic cerebral palsy: Secondary | ICD-10-CM

## 2014-08-01 DIAGNOSIS — F32A Depression, unspecified: Secondary | ICD-10-CM | POA: Insufficient documentation

## 2014-08-01 DIAGNOSIS — G35 Multiple sclerosis: Secondary | ICD-10-CM

## 2014-08-01 DIAGNOSIS — R200 Anesthesia of skin: Secondary | ICD-10-CM

## 2014-08-01 MED ORDER — CLONAZEPAM 1 MG PO TABS: 4.0000 mg | ORAL_TABLET | Freq: Every day | ORAL | Status: DC

## 2014-08-01 NOTE — Progress Notes (Signed)
[facility]  PATIENT: Taylor Hancock DOB: 09-23-1960  REFERRING CLINICIAN: Shirline Frees   HISTORY FROM: Patient REASON FOR VISIT: PPMS   HISTORICAL  CHIEF COMPLAINT:  Chief Complaint  Patient presents with  . Multiple Sclerosis    Leighton is primary progressive ms.  Sts. she is under more stress, with family issues right now--having to care for mother-in-law with demenita.  Sts. depression has been worse  Sts. gait/balance have been worse in the hot weather--she is using her rollator more than her cane./fim    HISTORY OF PRESENT ILLNESS:  Taylor Hancock is a 54 year old woman with MS diagnosed in 2005.  MS History:  She was diagnosed with multiple sclerosis in 2005 after presenting with several years of worsening gait. Initially, she was diagnosed with relapsing remitting MS and was placed on Betaseron. She had difficulty tolerating Betaseron but did continue to take it. Her first MRI after the diagnosis was unchanged from her previous one. A couple years later she transferred her care to me. After review of her time course of disease, it was apparent that she had a progressive MS and not a relapsing form of MS. Therefore, the Betaseron was discontinued.   Gait/strength:    She has continued to have progressive difficulties with her gait, especially left leg weakness and spasticity.  She uses a Rollator but still falls about 2 or 3 times most months. Her left leg is painful due to frequent cramps. The cramping spasticity seems to be worse at night and she often wakes up with a fair amount of pain.  She is currently on tizanidine 4 mg at night, 10 mg of cyclobenzaprine and 4 mg of clonazepam at bedtime. She had difficulty tolerating baclofen in the past.   Due to risk of side effects, she did not want to try Ampyra in the past. Her left foot drop does better with a Walk-Aide Neurostimulator on the left.    Sensory:  She notes dysesthetic sensations in her trunk and  these are more frequent and worse.  . She also has some numbness in her hands. Numbness is fairly constant. She sometimes drops items out of her hands. Typing is more difficult. Both of her feet feel numb.     Vision:  She denies major difficulties with her vision but does note that she has reduced depth perception compared to the past. She does not feel safe driving at night anymore.  Bowel/Bladder:   She reports urinary frequency and urgency. She has occasional incontinence when she can't get to the bathroom in time. She also notes that she sometimes hesitancy and she is not sure that she completely empties   She notes bowel incontinence at times.  This is more likely to occur in the heat.  Fatigue/sleep;  She has noted a lot more fatigue, associated with more depression. The fatigue is both mental and physical. She has more difficulty focusing, especially as the day goes on. She also gets tired more easily than she used to with physical tasks. Mood has been worse over the past few months. She notes some changes at work and more work has been pushed her way.She is not able to stand as long as she used to and starts slouching over after a few minutes.    She has never tried anything for her fatigue.    She is sleeping 8-9 hours a night.  She feels quality of sleep is good with only once a night nocturia  Mood/cognition:  Mood is worse with depression.  She is on low dose escitalopram for hot flashes and we discussed increasing the dose.   She has more stress with her mother-in-law having dementia and them having issues with her father in law.   She is sometines forgetful, especially when she is more tired (end of day) or if hot.. She has occasional difficulties with verbal fluency. She has some issues with in executive function and has more difficulty with planning and doing complicated tasks.     REVIEW OF SYSTEMS:  Constitutional: No fevers, chills, sweats, or change in appetite Eyes: No visual  changes, double vision, eye pain Ear, nose and throat: No hearing loss, ear pain, nasal congestion, sore throat.  Change in voice Cardiovascular: No chest pain, palpitations Respiratory:  No shortness of breath at rest or with exertion.   No wheezes GastrointestinaI: No nausea, vomiting, diarrhea, abdominal pain, fecal incontinence Genitourinary:  No dysuria, urinary retention or frequency.  No nocturia. Musculoskeletal:  No neck pain, back pain Integumentary: No rash, pruritus, skin lesions Neurological: as above Psychiatric: Increased depression   Endocrine: No palpitations, diaphoresis, change in appetite, change in weigh or increased thirst Hematologic/Lymphatic:  No anemia, purpura, petechiae. Allergic/Immunologic: No itchy/runny eyes, nasal congestion, recent allergic reactions, rashes  ALLERGIES: Allergies  Allergen Reactions  . Ppa-Mma Express [Alitraq] Nausea And Vomiting    Blood in vomit and back felt like it was breaking.   Marland Kitchen Zithromax [Azithromycin] Other (See Comments)    abd cramping  . Ultram [Tramadol Hcl] Anxiety    HOME MEDICATIONS: Outpatient Prescriptions Prior to Visit  Medication Sig Dispense Refill  . clonazePAM (KLONOPIN) 1 MG tablet Take 4 tablets (4 mg total) by mouth at bedtime. 360 tablet 1  . cyclobenzaprine (FLEXERIL) 5 MG tablet TAKE 1 TO 2 TABLETS AT BEDTIME 60 tablet 0  . escitalopram (LEXAPRO) 5 MG tablet Take 5 mg by mouth daily.    Marland Kitchen tiZANidine (ZANAFLEX) 4 MG tablet TAKE 1 TABLET BEDTIME 30 tablet 6  . clobetasol (TEMOVATE) 0.05 % external solution   5  . HYDROcodone-acetaminophen (NORCO/VICODIN) 5-325 MG per tablet Take 2 tablets by mouth every 4 (four) hours as needed for pain. 10 tablet 0  . cyclobenzaprine (FLEXERIL) 5 MG tablet TAKE 1 TO 2 TABLETS AT BEDTIME (Patient not taking: Reported on 08/01/2014) 60 tablet 0  . hydrocortisone 2.5 % cream   1  . ketoconazole (NIZORAL) 2 % shampoo   5  . nitrofurantoin (MACRODANTIN) 100 MG capsule   0    . sulfamethoxazole-trimethoprim (BACTRIM DS,SEPTRA DS) 800-160 MG per tablet   0   No facility-administered medications prior to visit.    PAST MEDICAL HISTORY: Past Medical History  Diagnosis Date  . Multiple sclerosis   . MS (multiple sclerosis)   . Concussion   . Movement disorder   . Neuropathy   . Vision abnormalities     PAST SURGICAL HISTORY: Past Surgical History  Procedure Laterality Date  . Knee arthroscopy    . I&d extremity  05/30/2011    Procedure: IRRIGATION AND DEBRIDEMENT EXTREMITY;  Surgeon: Tennis Must, MD;  Location: [facility];  Service: Orthopedics;;  Incision and drainage of open Proximal interphalangeal joint with closed reduction of Proximal interphalangeal  joint left long finger  . Fracture surgery      FAMILY HISTORY: Family History  Problem Relation Age of Onset  . Cancer Mother   . Hypertension Mother   . Cancer Father   . Hyperlipidemia Father   .  Diabetes Father     SOCIAL HISTORY:  History   Social History  . Marital Status: Married    Spouse Name: N/A  . Number of Children: N/A  . Years of Education: N/A   Occupational History  . Not on file.   Social History Main Topics  . Smoking status: Former Smoker -- 0.50 packs/day  . Smokeless tobacco: Not on file  . Alcohol Use: No     Comment: couple drnks a day  . Drug Use: No  . Sexual Activity: Not on file   Other Topics Concern  . Not on file   Social History Narrative     PHYSICAL EXAM  Filed Vitals:   08/01/14 1312  BP: 146/84  Pulse: 86  Resp: 14  Height: 5\' 4"  (1.626 m)  Weight: 151 lb (68.493 kg)    Body mass index is 25.91 kg/(m^2).   General: The patient is well-developed and well-nourished and in no acute distress  Neurologic Exam  Mental status: The patient is alert and oriented x 3 at the time of the examination. The patient has apparent normal recent and remote memory, with an apparently normal attention span and concentration ability.   Speech is  normal.  Cranial nerves: Extraocular movements are full.   Facial symmetry is present. There is good facial sensation to soft touch bilaterally.Facial strength is normal.  Trapezius and sternocleidomastoid strength is normal. No dysarthria is noted.  The tongue is midline, and the patient has symmetric elevation of the soft palate.    Motor:  Muscle bulk is normal. She has mild right and moderate left increased tone. 4-/5 in the left leg hip flexures and 4 over 5 in the right leg. Strength is 4+ over 5 in the lower left leg.  Sensory: Sensory testing is intact to pinprick, soft touch, vibration sensation on all 4 extremities..   Coordination: Cerebellar testing reveals good finger-nose-finger and poor heel-to-shin (left worse than right)   Gait and station: Station is stable and gait is spastic on the left with wide stance and reduced stride   She cannot do a tandem walk. Romberg is negative.   Reflexes: Deep tendon reflexes are symmetric and normal in the arms but she has increased reflexes in both legs with nonsustained clonus at the left ankle.     DIAGNOSTIC DATA (LABS, IMAGING, TESTING) - I reviewed patient records, labs, notes, testing and imaging myself where available.     ASSESSMENT AND PLAN   Multiple sclerosis, primary progressive  Spastic diplegia  Spastic gait  Numbness  Depression  Other fatigue    1.  Increase Lexapro to 10 mg nightly  2.  Stay active as tolerated.   Use Rollator 3.   I suggested a referral to [facility].. She would prefer not to at this point.    4.   She will return to see me in 5-6 months, sooner if she has new or worsening neurologic symptoms.   Jenesis Suchy A. Felecia Shelling, MD, PhD 1/75/1025, 8:52 PM Certified in Neurology, Clinical Neurophysiology, Sleep Medicine, Pain Medicine and Neuroimaging  [facility] [address], [place], [place] [address] [phone]

## 2014-08-02 DIAGNOSIS — Z0289 Encounter for other administrative examinations: Secondary | ICD-10-CM

## 2014-08-06 NOTE — Telephone Encounter (Signed)
error 

## 2014-10-04 ENCOUNTER — Other Ambulatory Visit: Payer: Self-pay | Admitting: Neurology

## 2014-12-24 ENCOUNTER — Telehealth: Payer: Self-pay | Admitting: Neurology

## 2014-12-24 NOTE — Telephone Encounter (Signed)
Patient called to advise she is not doing good, states she has MS and has absolutely no energy, is trying to work, wakes herself up snoring, not sure if this is sleep apnea, is very depressed, "may even need to be taken out of work".

## 2014-12-24 NOTE — Telephone Encounter (Signed)
I have spoken with Taylor Hancock this morning--she c/o gradual worsening of fatigue, depression.  Appt. given to see RAS on 12-26-14 at 1120/fim

## 2014-12-26 ENCOUNTER — Ambulatory Visit (INDEPENDENT_AMBULATORY_CARE_PROVIDER_SITE_OTHER): Payer: 59 | Admitting: Neurology

## 2014-12-26 ENCOUNTER — Encounter: Payer: Self-pay | Admitting: Neurology

## 2014-12-26 VITALS — BP 143/85 | HR 97 | Ht 64.0 in | Wt 154.0 lb

## 2014-12-26 DIAGNOSIS — R0683 Snoring: Secondary | ICD-10-CM

## 2014-12-26 DIAGNOSIS — R296 Repeated falls: Secondary | ICD-10-CM | POA: Diagnosis not present

## 2014-12-26 DIAGNOSIS — M21372 Foot drop, left foot: Secondary | ICD-10-CM | POA: Diagnosis not present

## 2014-12-26 DIAGNOSIS — G35 Multiple sclerosis: Secondary | ICD-10-CM

## 2014-12-26 DIAGNOSIS — R200 Anesthesia of skin: Secondary | ICD-10-CM

## 2014-12-26 DIAGNOSIS — F32A Depression, unspecified: Secondary | ICD-10-CM

## 2014-12-26 DIAGNOSIS — R5383 Other fatigue: Secondary | ICD-10-CM

## 2014-12-26 DIAGNOSIS — R261 Paralytic gait: Secondary | ICD-10-CM | POA: Diagnosis not present

## 2014-12-26 DIAGNOSIS — F329 Major depressive disorder, single episode, unspecified: Secondary | ICD-10-CM | POA: Diagnosis not present

## 2014-12-26 DIAGNOSIS — G801 Spastic diplegic cerebral palsy: Secondary | ICD-10-CM

## 2014-12-26 MED ORDER — TIZANIDINE HCL 4 MG PO TABS: ORAL_TABLET | ORAL | Status: DC

## 2014-12-26 MED ORDER — CLONAZEPAM 1 MG PO TABS: 4.0000 mg | ORAL_TABLET | Freq: Every day | ORAL | Status: DC

## 2014-12-26 NOTE — Progress Notes (Signed)
[facility]  PATIENT: Taylor Hancock DOB: 1960/06/07  REFERRING CLINICIAN: Shirline Frees   HISTORY FROM: Patient REASON FOR VISIT: PPMS   HISTORICAL  CHIEF COMPLAINT:  Chief Complaint  Patient presents with  . Follow-up    Rm 14. F/u MS. Cont to c/o worsening gait/balance problems, faitgue, depression. Needs refill on clonazepam.     HISTORY OF PRESENT ILLNESS:  Taylor Hancock is a 54 year old woman with MS diagnosed in 2005.   Her course has been predominantly primary progressive with worseniing spasticity and weakness, left worse than right.     She also is noting pain in the right flank.    Fatigue and mood are both worse.     MS History:  She was diagnosed with multiple sclerosis in 2005 after presenting with several years of worsening gait. Initially, she was diagnosed with relapsing remitting MS and was placed on Betaseron. She had difficulty tolerating Betaseron but did continue to take it. Her first MRI after the diagnosis was unchanged from her previous one. A couple years later she transferred her care to me. After review of her time course of disease, it was apparent that she had a progressive MS and not a relapsing form of MS. Therefore, the Betaseron was discontinued.   Gait/strength:    She has progressive gait worsening, especially left leg weakness and spasticity.  She uses a Rollator but still falls about 2 or 3 times most months. She has a left AFO for foot drop.  She had a bad fall last month and hurt her leg.   She had a lot of swelling and couldn't make it to the gym.    Her left leg is painful due to frequent cramps. The cramping spasticity seems to be worse at night and she often wakes up with a fair amount of pain.  She is currently on tizanidine 4 mg at night, 10 mg of cyclobenzaprine and 4 mg of clonazepam at bedtime. She can't tolerate these medications during the day.  She had difficulty tolerating baclofen in the past.   Due to risk of side  effects, she did not want to try Ampyra in the past. Her left foot drop does better with a Walk-Aide Neurostimulator on the left.    Sensory:  She has right trunk dysesthetic sensations.   She also has constant numbness in her hands.  She sometimes drops items out of her hands. Typing is more difficult. Both of her feet feel numb but not painful  Vision:  She denies major difficulties with her vision but does note that she has reduced depth perception compared to the past. She does not feel safe driving at night anymore.  Bowel/Bladder:   She reports urinary frequency and urgency. She has occasional incontinence when she can't get to the bathroom in time. She also notes that she sometimes hesitancy and she is not sure that she completely empties   She notes bowel incontinence at times.  This is more likely to occur in the heat.  Fatigue/sleep;  She is more fatigued this year, especially the past few months.  The fatigue is both mental and physical.   Of note, she has also had depression this year.   She has more difficulty focusing, especially as the day goes on. She also gets tired more easily than she used to with physical tasks. Mood has been worse over the past few months. She notes some changes at work and more work has been pushed her  way.She is not able to stand as long as she used to and starts slouching over after a few minutes.    She has never tried anything for her fatigue.    Sleep is worse and is now un- refreshed.    She is snoring more but husband sleeps soundly and has not noted any pauses or gasping.   She falls asleep easily but wakes up and stays awake worrying.  She does not want to take a stimulant  Mood:    Mood is worse with more depression. She is noting more apathy and now is sleeping poorly.   She is not having tearfulness.    She is on low dose escitalopram for hot flashes and we discussed increasing the dose.   She has stress with her mother-in-law having dementia and her  husband also having issues.    She is drinking a couple alcoholic beverages daily now.    She notes anxiety at   Cognitive:   She is sometines forgetful, especially when she is more tired (end of day) or if hot.. She has occasional difficulties with verbal fluency. She has some issues with in executive function and has more difficulty with planning and doing complicated tasks.  EPWORTH SLEEPINESS SCALE  On a scale of 0 - 3 what is the chance of dozing:  Sitting and Reading:   0 Watching TV:    0 Sitting inactive in a public place: 0 Passenger in car for one hour: 0 Lying down to rest in the afternoon: 3 Sitting and talking to someone: 0 Sitting quietly after lunch:  1 In a car, stopped in traffic:  0  Total (out of 24):   4/24    REVIEW OF SYSTEMS:  Constitutional: No fevers, chills, sweats, or change in appetite.  Notes fatigue Eyes: No visual changes, double vision, eye pain Ear, nose and throat: No hearing loss, ear pain, nasal congestion, sore throat.    Cardiovascular: No chest pain, palpitations Respiratory:  No shortness of breath at rest or with exertion.   No wheezes GastrointestinaI: No nausea, vomiting, diarrhea, abdominal pain, fecal incontinence Genitourinary:  as above Musculoskeletal:  No neck pain, back pain Integumentary: No rash, pruritus, skin lesions Neurological: as above Psychiatric: Increased depression   Endocrine: No palpitations, diaphoresis, change in appetite, change in weigh or increased thirst Hematologic/Lymphatic:  No anemia, purpura, petechiae. Allergic/Immunologic: No itchy/runny eyes, nasal congestion, recent allergic reactions, rashes  ALLERGIES: Allergies  Allergen Reactions  . Ppa-Mma Express [Alitraq] Nausea And Vomiting    Blood in vomit and back felt like it was breaking.   Marland Kitchen Zithromax [Azithromycin] Other (See Comments)    abd cramping  . Ultram [Tramadol Hcl] Anxiety    HOME MEDICATIONS: Outpatient Prescriptions Prior to Visit   Medication Sig Dispense Refill  . clobetasol (TEMOVATE) 0.05 % external solution   5  . clonazePAM (KLONOPIN) 1 MG tablet Take 4 tablets (4 mg total) by mouth at bedtime. 360 tablet 1  . cyclobenzaprine (FLEXERIL) 5 MG tablet TAKE 1 TO 2 TABLETS AT BEDTIME 60 tablet 3  . escitalopram (LEXAPRO) 5 MG tablet Take 5 mg by mouth daily.    Marland Kitchen HYDROcodone-acetaminophen (NORCO/VICODIN) 5-325 MG per tablet Take 2 tablets by mouth every 4 (four) hours as needed for pain. 10 tablet 0  . tiZANidine (ZANAFLEX) 4 MG tablet TAKE 1 TABLET BEDTIME 30 tablet 6   No facility-administered medications prior to visit.    PAST MEDICAL HISTORY: Past Medical History  Diagnosis  Date  . Multiple sclerosis ([place])   . MS (multiple sclerosis) ([place])   . Concussion   . Movement disorder   . Neuropathy ([place])   . Vision abnormalities     PAST SURGICAL HISTORY: Past Surgical History  Procedure Laterality Date  . Knee arthroscopy    . I&d extremity  05/30/2011    Procedure: IRRIGATION AND DEBRIDEMENT EXTREMITY;  Surgeon: Tennis Must, MD;  Location: [facility];  Service: Orthopedics;;  Incision and drainage of open Proximal interphalangeal joint with closed reduction of Proximal interphalangeal  joint left long finger  . Fracture surgery      FAMILY HISTORY: Family History  Problem Relation Age of Onset  . Cancer Mother   . Hypertension Mother   . Cancer Father   . Hyperlipidemia Father   . Diabetes Father     SOCIAL HISTORY:  Social History   Social History  . Marital Status: Married    Spouse Name: N/A  . Number of Children: N/A  . Years of Education: N/A   Occupational History  . Not on file.   Social History Main Topics  . Smoking status: Former Smoker -- 0.50 packs/day  . Smokeless tobacco: Not on file  . Alcohol Use: No     Comment: couple drnks a day  . Drug Use: No  . Sexual Activity: Not on file   Other Topics Concern  . Not on file   Social History Narrative     PHYSICAL  EXAM  Filed Vitals:   12/26/14 1126  BP: 143/85  Pulse: 97  Height: 5\' 4"  (1.626 m)  Weight: 154 lb (69.854 kg)    Body mass index is 26.42 kg/(m^2).   General: The patient is well-developed and well-nourished and in no acute distress  Neurologic Exam  Mental status: The patient is alert and oriented x 3 at the time of the examination. The patient has apparent normal recent and remote memory, with an apparently normal attention span and concentration ability.   Speech is normal.  Cranial nerves: Extraocular movements are full.   Facial symmetry is present. There is good facial sensation to soft touch bilaterally.Facial strength is normal.  Trapezius and sternocleidomastoid strength is normal. No dysarthria is noted.  The tongue is midline, and the patient has symmetric elevation of the soft palate.    Motor:  Muscle bulk is normal. She has mild right and moderate left increased tone. 4-/5 in the left leg hip flexures and 4 over 5 in the right leg. Strength is 4+ over 5 in the lower left leg.  Sensory: Sensory testing is intact to pinprick, soft touch, vibration sensation on all 4 extremities..   Coordination: Cerebellar testing reveals good finger-nose-finger and poor heel-to-shin (left worse than right)   Gait and station: Station is stable and gait is spastic on the left with wide stance and reduced stride   She cannot do a tandem walk. Romberg is negative.   Reflexes: Deep tendon reflexes are symmetric and normal in the arms but she has increased reflexes in both legs with nonsustained clonus at the left ankle.     DIAGNOSTIC DATA (LABS, IMAGING, TESTING) - I reviewed patient records, labs, notes, testing and imaging myself where available.     ASSESSMENT AND PLAN   Multiple sclerosis, primary progressive ([place]) - Plan: Ambulatory referral to Physical Therapy  Spastic diplegia ([facility])  Depression  Foot drop, left - Plan: Ambulatory referral to Physical  Therapy  Spastic gait - Plan: Ambulatory  referral to Physical Therapy  Other fatigue  Numbness  Frequent falls  Snoring    1.  Increase Lexapro to 10 mg nightly.    She will talk to Behavioral health at work.   If not helpful we can refer to a psychologist/counselor  2.  Stay active as tolerated.  PT for gait.   Use Rollator 3.  Continue other medications.     4.  Power scooter for frequent falls and spastic gait. 5.  If not better, set up a PSG or HST.     6.  She will return to see me in 4 months, sooner if she has new or worsening neurologic symptoms.      45 minute face to face with > 1/2 the time counseling and coordinating care about her MS, poor gait and depression.  [place] Spainhower A. Felecia Shelling, MD, PhD [id number], [id number] AM Certified in Neurology, Clinical Neurophysiology, Sleep Medicine, Pain Medicine and Neuroimaging  [facility] [address], [place], [place] [address] [phone]

## 2014-12-27 ENCOUNTER — Telehealth: Payer: Self-pay | Admitting: Neurology

## 2014-12-27 NOTE — Telephone Encounter (Signed)
I have spoken with Taylor Hancock this evening and per RAS, advised ok to take 1 or 2 Tizanidine qhs prn.  She verbalized understanding of same/fim

## 2014-12-27 NOTE — Telephone Encounter (Signed)
Patient is calling. She just picked up her medication tiZANidine (ZANAFLEX) 4 MG tablet from [facility] on [address] in [facility] and the Rx says to take 1 at bedtime but she says as discussed at her visit yesterday she was to take 2 at bedtime. Please call and discuss. Thank you.

## 2015-01-09 ENCOUNTER — Emergency Department (HOSPITAL_BASED_OUTPATIENT_CLINIC_OR_DEPARTMENT_OTHER)
Admission: EM | Admit: 2015-01-09 | Discharge: 2015-01-09 | Disposition: A | Discharge status: Home / Self Care | Payer: 59 | Attending: Emergency Medicine | Admitting: Emergency Medicine

## 2015-01-09 ENCOUNTER — Encounter (HOSPITAL_BASED_OUTPATIENT_CLINIC_OR_DEPARTMENT_OTHER): Payer: Self-pay

## 2015-01-09 ENCOUNTER — Emergency Department (HOSPITAL_BASED_OUTPATIENT_CLINIC_OR_DEPARTMENT_OTHER): Payer: 59

## 2015-01-09 DIAGNOSIS — R1013 Epigastric pain: Secondary | ICD-10-CM | POA: Diagnosis not present

## 2015-01-09 DIAGNOSIS — Z79899 Other long term (current) drug therapy: Secondary | ICD-10-CM | POA: Insufficient documentation

## 2015-01-09 DIAGNOSIS — Z87828 Personal history of other (healed) physical injury and trauma: Secondary | ICD-10-CM | POA: Insufficient documentation

## 2015-01-09 DIAGNOSIS — R05 Cough: Secondary | ICD-10-CM | POA: Insufficient documentation

## 2015-01-09 DIAGNOSIS — R059 Cough, unspecified: Secondary | ICD-10-CM

## 2015-01-09 DIAGNOSIS — Z87891 Personal history of nicotine dependence: Secondary | ICD-10-CM | POA: Insufficient documentation

## 2015-01-09 DIAGNOSIS — Z8669 Personal history of other diseases of the nervous system and sense organs: Secondary | ICD-10-CM | POA: Diagnosis not present

## 2015-01-09 LAB — CBC WITH DIFFERENTIAL/PLATELET
BASOS PCT: 1 %
Basophils Absolute: 0 10*3/uL (ref 0.0–0.1)
EOS ABS: 0.1 10*3/uL (ref 0.0–0.7)
Eosinophils Relative: 2 %
HCT: 44.3 % (ref 36.0–46.0)
Hemoglobin: 15 g/dL (ref 12.0–15.0)
Lymphocytes Relative: 28 %
Lymphs Abs: 1.8 10*3/uL (ref 0.7–4.0)
MCH: 35.5 pg — AB (ref 26.0–34.0)
MCHC: 33.9 g/dL (ref 30.0–36.0)
MCV: 104.7 fL — ABNORMAL HIGH (ref 78.0–100.0)
MONO ABS: 0.8 10*3/uL (ref 0.1–1.0)
MONOS PCT: 12 %
Neutro Abs: 3.8 10*3/uL (ref 1.7–7.7)
Neutrophils Relative %: 57 %
Platelets: 245 10*3/uL (ref 150–400)
RBC: 4.23 MIL/uL (ref 3.87–5.11)
RDW: 12.6 % (ref 11.5–15.5)
WBC: 6.6 10*3/uL (ref 4.0–10.5)

## 2015-01-09 LAB — URINALYSIS, ROUTINE W REFLEX MICROSCOPIC
BILIRUBIN URINE: NEGATIVE
Glucose, UA: NEGATIVE mg/dL
KETONES UR: NEGATIVE mg/dL
Leukocytes, UA: NEGATIVE
NITRITE: NEGATIVE
PROTEIN: NEGATIVE mg/dL
Specific Gravity, Urine: 1.01 (ref 1.005–1.030)
pH: 5.5 (ref 5.0–8.0)

## 2015-01-09 LAB — COMPREHENSIVE METABOLIC PANEL
ALBUMIN: 4 g/dL (ref 3.5–5.0)
ALK PHOS: 60 U/L (ref 38–126)
ALT: 118 U/L — AB (ref 14–54)
ANION GAP: 10 (ref 5–15)
AST: 98 U/L — AB (ref 15–41)
BILIRUBIN TOTAL: 0.4 mg/dL (ref 0.3–1.2)
BUN: 9 mg/dL (ref 6–20)
CALCIUM: 8.6 mg/dL — AB (ref 8.9–10.3)
CO2: 25 mmol/L (ref 22–32)
CREATININE: 0.63 mg/dL (ref 0.44–1.00)
Chloride: 105 mmol/L (ref 101–111)
GFR calc Af Amer: >60 mL/min (ref >60)
GFR calc non Af Amer: >60 mL/min (ref >60)
GLUCOSE: 98 mg/dL (ref 65–99)
Potassium: 4.2 mmol/L (ref 3.5–5.1)
Sodium: 140 mmol/L (ref 135–145)
TOTAL PROTEIN: 7.4 g/dL (ref 6.5–8.1)

## 2015-01-09 LAB — URINE MICROSCOPIC-ADD ON

## 2015-01-09 LAB — LIPASE, BLOOD: Lipase: 40 U/L (ref 11–51)

## 2015-01-09 MED ORDER — SUCRALFATE 1 G PO TABS
1.0000 g | ORAL_TABLET | Freq: Three times a day (TID) | ORAL | Status: DC
  Administered 2015-01-09: 1 g via ORAL
  Filled 2015-01-09: qty 1

## 2015-01-09 MED ORDER — HYDROCOD POLST-CPM POLST ER 10-8 MG/5ML PO SUER: 5.0000 mL | Freq: Two times a day (BID) | ORAL | Status: DC | PRN

## 2015-01-09 NOTE — ED Notes (Signed)
C/o prod cough-dx with bronchitis by PCP 8 days ago-taking amoxil-slow gait to tx area with own walker

## 2015-01-09 NOTE — ED Provider Notes (Signed)
CSN: [id number]     Arrival date & time 01/09/15  1227 History   First MD Initiated Contact with Patient 01/09/15 1239     Chief Complaint  Patient presents with  . Cough     (Consider location/radiation/quality/duration/timing/severity/associated sxs/prior Treatment) HPI Patient presents with concern of pain about the epigastrium. Patient has been present for about one week.  The onset of symptoms, the patient saw her physician, was started on amoxicillin for presumed bronchitis. Since that time, cough is diminished, pain has stopped radiating. Previously the pain radiated from the epigastrium circumferentially bilaterally. Currently the pain is only in the epigastrium. Patient is sore, moderate. There is no new nausea, vomiting. Patient acknowledges drinking alcohol, several drinks daily. She states that she has a history of multiple sclerosis, progressive, takes no medication regularly for this.  Past Medical History  Diagnosis Date  . Multiple sclerosis ([place])   . MS (multiple sclerosis) ([place])   . Concussion   . Movement disorder   . Neuropathy ([place])   . Vision abnormalities    Past Surgical History  Procedure Laterality Date  . Knee arthroscopy    . I&d extremity  05/30/2011    Procedure: IRRIGATION AND DEBRIDEMENT EXTREMITY;  Surgeon: Tennis Must, MD;  Location: [facility];  Service: Orthopedics;;  Incision and drainage of open Proximal interphalangeal joint with closed reduction of Proximal interphalangeal  joint left long finger  . Fracture surgery     Family History  Problem Relation Age of Onset  . Cancer Mother   . Hypertension Mother   . Cancer Father   . Hyperlipidemia Father   . Diabetes Father    Social History  Substance Use Topics  . Smoking status: Former Smoker -- 0.50 packs/day  . Smokeless tobacco: None  . Alcohol Use: 0.0 oz/week     Comment: daily   OB History    No data available     Review of Systems  Constitutional:       Per HPI,  otherwise negative  HENT:       Per HPI, otherwise negative  Respiratory:       Per HPI, otherwise negative  Cardiovascular:       Per HPI, otherwise negative  Gastrointestinal: Negative for vomiting.  Endocrine:       Negative aside from HPI  Genitourinary:       Neg aside from HPI   Musculoskeletal:       Per HPI, otherwise negative  Skin: Negative.   Neurological: Negative for syncope.      Allergies  Levaquin; Ppa-mma express; Zithromax; and Ultram  Home Medications   Prior to Admission medications   Medication Sig Start Date End Date Taking? Authorizing Provider  clobetasol (TEMOVATE) 0.05 % external solution  12/28/13   Historical Provider, MD  clonazePAM (KLONOPIN) 1 MG tablet Take 4 tablets (4 mg total) by mouth at bedtime. 12/26/14   Britt Bottom, MD  cyclobenzaprine (FLEXERIL) 5 MG tablet TAKE 1 TO 2 TABLETS AT BEDTIME 10/04/14   Britt Bottom, MD  escitalopram (LEXAPRO) 5 MG tablet Take 5 mg by mouth daily.    Historical Provider, MD  HYDROcodone-acetaminophen (NORCO/VICODIN) 5-325 MG per tablet Take 2 tablets by mouth every 4 (four) hours as needed for pain. 02/27/12   Fransico Meadow, PA-C  tiZANidine (ZANAFLEX) 4 MG tablet TAKE 1 TABLET BEDTIME 12/26/14   Britt Bottom, MD   BP 132/104 mmHg  Pulse 90  Temp(Src) 98 F (36.7 C) (  Oral)  Resp 18  Ht 5\' 5"  (1.651 m)  Wt 155 lb (70.308 kg)  BMI 25.79 kg/m2  SpO2 98% Physical Exam  Constitutional: She is oriented to person, place, and time. She appears well-developed and well-nourished. No distress.  HENT:  Head: Normocephalic and atraumatic.  Eyes: Conjunctivae and EOM are normal.  Cardiovascular: Normal rate and regular rhythm.   Pulmonary/Chest: Effort normal and breath sounds normal. No stridor. No respiratory distress.  Abdominal: She exhibits no distension. There is tenderness.  Mild tenderness to palpation about the epigastrium, no rebound, guarding  Musculoskeletal: She exhibits no edema.   Neurological: She is alert and oriented to person, place, and time. No cranial nerve deficit.  Skin: Skin is warm and dry.  Psychiatric: She has a normal mood and affect.  Nursing note and vitals reviewed.   ED Course  Procedures (including critical care time) Labs Review Labs Reviewed  COMPREHENSIVE METABOLIC PANEL - Abnormal; Notable for the following:    Calcium 8.6 (*)    AST 98 (*)    ALT 118 (*)    All other components within normal limits  CBC WITH DIFFERENTIAL/PLATELET - Abnormal; Notable for the following:    MCV 104.7 (*)    MCH 35.5 (*)    All other components within normal limits  URINALYSIS, ROUTINE W REFLEX MICROSCOPIC (NOT AT [facility]) - Abnormal; Notable for the following:    APPearance CLOUDY (*)    Hgb urine dipstick TRACE (*)    All other components within normal limits  URINE MICROSCOPIC-ADD ON - Abnormal; Notable for the following:    Squamous Epithelial / LPF 6-30 (*)    Bacteria, UA RARE (*)    All other components within normal limits  LIPASE, BLOOD    Imaging Review Dg Chest 2 View  01/09/2015  CLINICAL DATA:  Cough, weakness and central chest pain. Recent history bronchitis. EXAM: CHEST  2 VIEW COMPARISON:  None. FINDINGS: Heart size is normal. Mediastinal shadows are normal. The lungs are clear. No objective bronchial thickening. No infiltrate, collapse or effusion. Bony structures are unremarkable. IMPRESSION: No active disease Electronically Signed   By: Nelson Chimes M.D.   On: 01/09/2015 13:08   I have personally reviewed and evaluated these images and lab results as part of my medical decision-making.   3:08 PM On repeat exam the patient appears well. We had a lengthy conversation about x-ray, labs, urinalysis. Patient reiterates that she has been feeling generally better since her initial evaluation with primary care last week. With reassuring results, she was discharged in stable condition. Patient requests Tussionex MDM  Patient presents  with concern of ongoing cough. Notably, the patient actually states that her coughing has improved since initial evaluation with primary care, and initiation of amoxicillin. However, she does complain of epigastric discomfort, and given her history of MS, broad differential was considered. Here, Laotian largely reassuring. Patient is afebrile, hemodynamically stable. No evidence for pneumonia. Patient discharged in stable condition to follow-up with primary care.  Carmin Muskrat, MD 01/09/15 [phone]

## 2015-01-09 NOTE — ED Notes (Signed)
Rt nicky at bedside for resp assessment.

## 2015-01-09 NOTE — Discharge Instructions (Signed)
As discussed, your evaluation today has been largely reassuring.  But, it is important that you monitor your condition carefully, and do not hesitate to return to the ED if you develop new, or concerning changes in your condition. ? ?Otherwise, please follow-up with your physician for appropriate ongoing care. ? ?

## 2015-01-09 NOTE — ED Notes (Signed)
Taylor Hancock states she feels like may be getting UTI, I took urine sample as patient needed to void.

## 2015-01-11 ENCOUNTER — Ambulatory Visit
Admission: RE | Admit: 2015-01-11 | Discharge: 2015-01-11 | Disposition: A | Discharge status: Home / Self Care | Payer: 59 | Source: Ambulatory Visit | Attending: Family Medicine | Admitting: Family Medicine

## 2015-01-11 ENCOUNTER — Other Ambulatory Visit: Payer: Self-pay | Admitting: Family Medicine

## 2015-01-11 DIAGNOSIS — M546 Pain in thoracic spine: Secondary | ICD-10-CM

## 2015-01-15 ENCOUNTER — Other Ambulatory Visit: Payer: Self-pay | Admitting: Family Medicine

## 2015-01-15 DIAGNOSIS — R1031 Right lower quadrant pain: Secondary | ICD-10-CM

## 2015-01-15 DIAGNOSIS — R945 Abnormal results of liver function studies: Secondary | ICD-10-CM

## 2015-01-15 DIAGNOSIS — R1030 Lower abdominal pain, unspecified: Secondary | ICD-10-CM

## 2015-01-15 DIAGNOSIS — R079 Chest pain, unspecified: Secondary | ICD-10-CM

## 2015-01-15 DIAGNOSIS — R1084 Generalized abdominal pain: Secondary | ICD-10-CM

## 2015-01-15 DIAGNOSIS — R7989 Other specified abnormal findings of blood chemistry: Secondary | ICD-10-CM

## 2015-01-17 ENCOUNTER — Ambulatory Visit
Admission: RE | Admit: 2015-01-17 | Discharge: 2015-01-17 | Disposition: A | Discharge status: Home / Self Care | Payer: 59 | Source: Ambulatory Visit | Attending: Family Medicine | Admitting: Family Medicine

## 2015-01-17 DIAGNOSIS — R079 Chest pain, unspecified: Secondary | ICD-10-CM

## 2015-01-17 MED ORDER — IOPAMIDOL (ISOVUE-300) INJECTION 61%
75.0000 mL | Freq: Once | INTRAVENOUS | Status: AC | PRN
  Administered 2015-01-17: 75 mL via INTRAVENOUS

## 2015-01-18 ENCOUNTER — Ambulatory Visit
Admission: RE | Admit: 2015-01-18 | Discharge: 2015-01-18 | Disposition: A | Discharge status: Home / Self Care | Payer: 59 | Source: Ambulatory Visit | Attending: Family Medicine | Admitting: Family Medicine

## 2015-01-18 DIAGNOSIS — R1031 Right lower quadrant pain: Secondary | ICD-10-CM

## 2015-01-18 DIAGNOSIS — R7989 Other specified abnormal findings of blood chemistry: Secondary | ICD-10-CM

## 2015-01-18 DIAGNOSIS — R1084 Generalized abdominal pain: Secondary | ICD-10-CM

## 2015-01-18 DIAGNOSIS — R945 Abnormal results of liver function studies: Secondary | ICD-10-CM

## 2015-01-30 ENCOUNTER — Ambulatory Visit: Payer: 59 | Admitting: Neurology

## 2015-02-04 ENCOUNTER — Emergency Department (HOSPITAL_BASED_OUTPATIENT_CLINIC_OR_DEPARTMENT_OTHER)
Admission: EM | Admit: 2015-02-04 | Discharge: 2015-02-04 | Disposition: A | Discharge status: Home / Self Care | Payer: 59 | Attending: Emergency Medicine | Admitting: Emergency Medicine

## 2015-02-04 ENCOUNTER — Encounter (HOSPITAL_BASED_OUTPATIENT_CLINIC_OR_DEPARTMENT_OTHER): Payer: Self-pay | Admitting: *Deleted

## 2015-02-04 ENCOUNTER — Telehealth: Payer: Self-pay | Admitting: Neurology

## 2015-02-04 ENCOUNTER — Emergency Department (HOSPITAL_BASED_OUTPATIENT_CLINIC_OR_DEPARTMENT_OTHER): Payer: 59

## 2015-02-04 DIAGNOSIS — W06XXXA Fall from bed, initial encounter: Secondary | ICD-10-CM | POA: Insufficient documentation

## 2015-02-04 DIAGNOSIS — R0781 Pleurodynia: Secondary | ICD-10-CM

## 2015-02-04 DIAGNOSIS — S20212A Contusion of left front wall of thorax, initial encounter: Secondary | ICD-10-CM | POA: Insufficient documentation

## 2015-02-04 DIAGNOSIS — Y9289 Other specified places as the place of occurrence of the external cause: Secondary | ICD-10-CM | POA: Diagnosis not present

## 2015-02-04 DIAGNOSIS — Z87828 Personal history of other (healed) physical injury and trauma: Secondary | ICD-10-CM | POA: Insufficient documentation

## 2015-02-04 DIAGNOSIS — Y9389 Activity, other specified: Secondary | ICD-10-CM | POA: Diagnosis not present

## 2015-02-04 DIAGNOSIS — Z8669 Personal history of other diseases of the nervous system and sense organs: Secondary | ICD-10-CM | POA: Insufficient documentation

## 2015-02-04 DIAGNOSIS — Z79899 Other long term (current) drug therapy: Secondary | ICD-10-CM | POA: Insufficient documentation

## 2015-02-04 DIAGNOSIS — Y998 Other external cause status: Secondary | ICD-10-CM | POA: Insufficient documentation

## 2015-02-04 DIAGNOSIS — S29001A Unspecified injury of muscle and tendon of front wall of thorax, initial encounter: Secondary | ICD-10-CM | POA: Diagnosis present

## 2015-02-04 DIAGNOSIS — Z87891 Personal history of nicotine dependence: Secondary | ICD-10-CM | POA: Insufficient documentation

## 2015-02-04 MED ORDER — TIZANIDINE HCL 4 MG PO TABS: ORAL_TABLET | ORAL | Status: DC

## 2015-02-04 NOTE — ED Notes (Signed)
She uses a walker due to MS. Fell 3 days ago. Injury to her left ribs. She is unable to sleep due to pain.

## 2015-02-04 NOTE — Discharge Instructions (Signed)
I recommend taking 800mg  Ibuprofen 3 times a day for pain relief. You may also apply ice to affected area for 15-20 min 3-4 times daily for pain relief. Follow-up with your primary care provider in 3-4 days. Please return to the Emergency Department if symptoms worsen or new onset of shortness of breath, chest pain, coughing up blood, numbness, tingling, weakness.

## 2015-02-04 NOTE — ED Notes (Signed)
[address] out of bed onto left side. Bruising noted to left rib and posterior back. BBS clr.

## 2015-02-04 NOTE — Telephone Encounter (Signed)
Patient is calling and states that her Rx Tizanidine states she should take 1 4mg  @bedtime  and she and Dr. Felecia Shelling agreed she is to take 2  @ bedtime. Could the instructions be changed to reflect that please and sent to [place], [place].  Thanks!

## 2015-02-04 NOTE — Telephone Encounter (Signed)
Per previous note:  Lesly Rubenstein, RN at 12/27/2014 5:09 PM     Status: Signed       Expand All Collapse All   I have spoken with Sharyn Lull this evening and per RAS, advised ok to take 1 or 2 Tizanidine qhs prn. She verbalized understanding of same/fim       Rx has now been updated and resent.

## 2015-02-04 NOTE — ED Provider Notes (Signed)
CSN: [id number]     Arrival date & time 02/04/15  92 History   First MD Initiated Contact with Patient 02/04/15 1534     Chief Complaint  Patient presents with  . rib pain      (Consider location/radiation/quality/duration/timing/severity/associated sxs/prior Treatment) HPI   Patient is a 55 year old female with past medical history of MS who presents the ED with complaint of left rib pain, onset 3 days. Patient reports she was lain in bed 3 days ago when she tried to roll over to turn off her side lamp resulting in her falling on the ground and landing on her left side. Denies head injury or LOC. Patient endorses having constant sharp pain to her left chest wall. She notes pain is worse with deep breathing. Endorses bruising to her left chest wall.  Denies fever, chills, headache, shortness of breath, chest pain, cough, wheezing, abdominal pain, nausea, vomiting, numbness, tingling, weakness. Patient denies taking any medications prior to arrival.  Past Medical History  Diagnosis Date  . Multiple sclerosis ([place])   . MS (multiple sclerosis) ([place])   . Concussion   . Movement disorder   . Neuropathy ([place])   . Vision abnormalities    Past Surgical History  Procedure Laterality Date  . Knee arthroscopy    . I&d extremity  05/30/2011    Procedure: IRRIGATION AND DEBRIDEMENT EXTREMITY;  Surgeon: Tennis Must, MD;  Location: [facility];  Service: Orthopedics;;  Incision and drainage of open Proximal interphalangeal joint with closed reduction of Proximal interphalangeal  joint left long finger  . Fracture surgery     Family History  Problem Relation Age of Onset  . Cancer Mother   . Hypertension Mother   . Cancer Father   . Hyperlipidemia Father   . Diabetes Father    Social History  Substance Use Topics  . Smoking status: Former Smoker -- 0.50 packs/day  . Smokeless tobacco: None  . Alcohol Use: 0.0 oz/week     Comment: daily   OB History    No data available     Review of  Systems  Cardiovascular: Positive for chest pain.  All other systems reviewed and are negative.     Allergies  Levaquin; Ppa-mma express; Zithromax; and Ultram  Home Medications   Prior to Admission medications   Medication Sig Start Date End Date Taking? Authorizing Provider  chlorpheniramine-HYDROcodone (TUSSIONEX PENNKINETIC ER) 10-8 MG/5ML SUER Take 5 mLs by mouth every 12 (twelve) hours as needed for cough. 01/09/15   Carmin Muskrat, MD  clobetasol (TEMOVATE) 0.05 % external solution  12/28/13   Historical Provider, MD  clonazePAM (KLONOPIN) 1 MG tablet Take 4 tablets (4 mg total) by mouth at bedtime. 12/26/14   Britt Bottom, MD  cyclobenzaprine (FLEXERIL) 5 MG tablet TAKE 1 TO 2 TABLETS AT BEDTIME 10/04/14   Britt Bottom, MD  escitalopram (LEXAPRO) 5 MG tablet Take 5 mg by mouth daily.    Historical Provider, MD  tiZANidine (ZANAFLEX) 4 MG tablet TAKE 1 to 2 TABLETS BEDTIME 02/04/15   Britt Bottom, MD   BP 126/82 mmHg  Pulse 80  Temp(Src) 98.4 F (36.9 C) (Oral)  Resp 16  Ht 5\' 5"  (1.651 m)  Wt 68.04 kg  BMI 24.96 kg/m2  SpO2 100% Physical Exam  Constitutional: She is oriented to person, place, and time. She appears well-developed and well-nourished. No distress.  HENT:  Head: Normocephalic and atraumatic. Head is without raccoon's eyes, without Battle's sign, without abrasion, without  contusion and without laceration.  Mouth/Throat: Oropharynx is clear and moist. No oropharyngeal exudate.  Eyes: Conjunctivae and EOM are normal. Pupils are equal, round, and reactive to light. Right eye exhibits no discharge. Left eye exhibits no discharge. No scleral icterus.  Neck: Normal range of motion. Neck supple.  Cardiovascular: Normal rate, regular rhythm, normal heart sounds and intact distal pulses.   Pulmonary/Chest: Effort normal and breath sounds normal. No respiratory distress. She has no wheezes. She has no rales. She exhibits tenderness (left lateral lower ribs TTP,  small ecchymosis present). She exhibits no crepitus, no edema, no deformity, no swelling and no retraction.  Abdominal: Soft. Bowel sounds are normal. She exhibits no distension. There is no tenderness.  Musculoskeletal: Normal range of motion. She exhibits no edema.  Lymphadenopathy:    She has no cervical adenopathy.  Neurological: She is alert and oriented to person, place, and time.  Skin: Skin is warm and dry. She is not diaphoretic.  Nursing note and vitals reviewed.   ED Course  Procedures (including critical care time) Labs Review Labs Reviewed - No data to display  Imaging Review Dg Ribs Unilateral W/chest Left  02/04/2015  CLINICAL DATA:  [place] 3 days ago, continued anterior lower rib pain wrapping around LEFT side, multiple falls lately due to multiple sclerosis EXAM: LEFT RIBS AND CHEST - 3+ VIEW COMPARISON:  01/09/2015 FINDINGS: Normal heart size, mediastinal contours, and pulmonary vascularity. Lungs clear. No pleural effusion or pneumothorax. Bones demineralized. BB placed at site of symptoms lower lateral LEFT chest. No definite rib fracture or bone destruction. IMPRESSION: No acute abnormalities. Electronically Signed   By: Lavonia Dana M.D.   On: 02/04/2015 14:06   I have personally reviewed and evaluated these images and lab results as part of my medical decision-making.  Filed Vitals:   02/04/15 1332 02/04/15 1618  BP: 144/102 126/82  Pulse: 91 80  Temp: 98.4 F (36.9 C)   Resp: 18 16     MDM   Final diagnoses:  Rib pain on left side    Patient presents with left rib pain that occurred after a fall 3 days ago. Denies head injury or LOC. VSS. Exam revealed tenderness at left lateral lower ribs with a small ecchymosis present. Lungs CTAB. Left rib/chest x-ray revealed no acute abnormalities. I suspect patient's symptoms are likely due to contusion/ecchymosis/associated with recent fall. Plan to discharge patient home, advised her to take NSAIDs for pain relief and  to follow-up with her PCP.  Evaluation does not show pathology requring ongoing emergent intervention or admission. Pt is hemodynamically stable and mentating appropriately. Discussed findings/results and plan with patient/guardian, who agrees with plan. All questions answered. Return precautions discussed and outpatient follow up given.    Chesley Noon [place], [place] 02/04/15 [place], MD 02/04/15 [phone]

## 2015-02-06 ENCOUNTER — Ambulatory Visit (INDEPENDENT_AMBULATORY_CARE_PROVIDER_SITE_OTHER): Payer: 59 | Admitting: Neurology

## 2015-02-06 ENCOUNTER — Telehealth: Payer: Self-pay | Admitting: Neurology

## 2015-02-06 ENCOUNTER — Encounter: Payer: Self-pay | Admitting: Neurology

## 2015-02-06 VITALS — BP 142/100 | HR 86 | Resp 16 | Ht 64.0 in | Wt 152.0 lb

## 2015-02-06 DIAGNOSIS — G801 Spastic diplegic cerebral palsy: Secondary | ICD-10-CM | POA: Diagnosis not present

## 2015-02-06 DIAGNOSIS — G35 Multiple sclerosis: Secondary | ICD-10-CM | POA: Diagnosis not present

## 2015-02-06 DIAGNOSIS — M21372 Foot drop, left foot: Secondary | ICD-10-CM | POA: Diagnosis not present

## 2015-02-06 DIAGNOSIS — R200 Anesthesia of skin: Secondary | ICD-10-CM | POA: Diagnosis not present

## 2015-02-06 DIAGNOSIS — R261 Paralytic gait: Secondary | ICD-10-CM | POA: Diagnosis not present

## 2015-02-06 DIAGNOSIS — F329 Major depressive disorder, single episode, unspecified: Secondary | ICD-10-CM | POA: Diagnosis not present

## 2015-02-06 DIAGNOSIS — R5383 Other fatigue: Secondary | ICD-10-CM | POA: Diagnosis not present

## 2015-02-06 DIAGNOSIS — F32A Depression, unspecified: Secondary | ICD-10-CM

## 2015-02-06 MED ORDER — HYDROCODONE-ACETAMINOPHEN 5-325 MG PO TABS: 1.0000 | ORAL_TABLET | Freq: Four times a day (QID) | ORAL | Status: DC | PRN

## 2015-02-06 NOTE — Telephone Encounter (Signed)
I have spoken with Taylor Hancock and offered note for few days, or if she needs a longer absence from work, and needs fmla paperwork completed, she will need an appt. to document injuries and need to be out of work.  She believes she will need fmla paperwork completed.  Has been out of work since 02-04-15.  Appt. given for this afternoon/fim

## 2015-02-06 NOTE — Telephone Encounter (Signed)
Patient called to advise she fell off her bed Saturday night, bruised ribs left side, went to ED, has MS. When patient googled it, it says it usually takes 3-4 weeks and she would need a note for work for this, however much time Dr. Felecia Shelling thinks she would need to be out of work.

## 2015-02-06 NOTE — Progress Notes (Signed)
[facility]  PATIENT: Taylor Hancock DOB: 1960-04-14  REFERRING CLINICIAN: Shirline Frees   HISTORY FROM: Patient REASON FOR VISIT: PPMS   HISTORICAL  CHIEF COMPLAINT:  Chief Complaint  Patient presents with  . Multiple Sclerosis    Sts. on 02-04-15 she was reaching for a Kleenex on her nightstand, fell out of bed, landing on  her left side on a carpeted  floor.  She was seen and treated at [facility] ER for left sided rib pain. She is having difficulty moving, due to pain left lateral ribs, left chest wall.  She is  having difficulty sleeping due to pain, so fatigue is worse.  Sts. she is taking otc Advil for pain/fim    HISTORY OF PRESENT ILLNESS:  Taylor Hancock is a 55 year old woman with MS diagnosed in 2005.   Her course has been predominantly primary progressive with worsening spasticity and weakness, left worse than right, causing a poor gait.  She fell in a bathroom last month and had some right flank pain.    Four days ago she fell off her high post bed.  She had leaned over to the night stand to turn on her white noise machine when she fell.   She landed on her left side and had severe pain.     She went to the ED and had x-rays of the ribs.     There were no fractures and she was diagnosed with bruised ribs.     She continues to have a lot of pain in the left chest.   Pain increases with pressure, coughing, deep breaths, laughing or movements in general.    She cannot wear a bra.   Due to constipation, she does not want to take hydrocodone.    She does not know if she has ever been on tramadol in the past.    Pain is worse laying on the left and is best if she holds still while sitting or laying.     MS History:  She was diagnosed with multiple sclerosis in 2005 after presenting with several years of worsening gait. Initially, she was diagnosed with relapsing remitting MS and was placed on Betaseron. She had difficulty tolerating Betaseron but did continue to  take it. Her first MRI after the diagnosis was unchanged from her previous one. A couple years later she transferred her care to me. After review of her time course of disease, it was apparent that she had a progressive MS and not a relapsing form of MS. Therefore, the Betaseron was discontinued.     REVIEW OF SYSTEMS:  Constitutional: No fevers, chills, sweats, or change in appetite.  Notes fatigue Eyes: No visual changes, double vision, eye pain Ear, nose and throat: No hearing loss, ear pain, nasal congestion, sore throat.    Cardiovascular: No chest pain, palpitations Respiratory:  No shortness of breath at rest or with exertion.   No wheezes GastrointestinaI: No nausea, vomiting, diarrhea, abdominal pain, fecal incontinence Genitourinary:  as above Musculoskeletal:  No neck pain, back pain Integumentary: No rash, pruritus, skin lesions Neurological: as above Psychiatric: Increased depression   Endocrine: No palpitations, diaphoresis, change in appetite, change in weigh or increased thirst Hematologic/Lymphatic:  No anemia, purpura, petechiae. Allergic/Immunologic: No itchy/runny eyes, nasal congestion, recent allergic reactions, rashes  ALLERGIES: Allergies  Allergen Reactions  . Levaquin [Levofloxacin In D5w]   . Ppa-Mma Express [Alitraq] Nausea And Vomiting    Blood in vomit and back felt like it was  breaking.   Marland Kitchen Zithromax [Azithromycin] Other (See Comments)    abd cramping  . Ultram [Tramadol Hcl] Anxiety    HOME MEDICATIONS: Outpatient Prescriptions Prior to Visit  Medication Sig Dispense Refill  . clobetasol (TEMOVATE) 0.05 % external solution   5  . clonazePAM (KLONOPIN) 1 MG tablet Take 4 tablets (4 mg total) by mouth at bedtime. 360 tablet 1  . cyclobenzaprine (FLEXERIL) 5 MG tablet TAKE 1 TO 2 TABLETS AT BEDTIME 60 tablet 3  . tiZANidine (ZANAFLEX) 4 MG tablet TAKE 1 to 2 TABLETS BEDTIME 60 tablet 3  . chlorpheniramine-HYDROcodone (TUSSIONEX PENNKINETIC ER) 10-8  MG/5ML SUER Take 5 mLs by mouth every 12 (twelve) hours as needed for cough. (Patient not taking: Reported on 02/06/2015) 115 mL 0  . escitalopram (LEXAPRO) 5 MG tablet Take 5 mg by mouth daily. Reported on 02/06/2015     No facility-administered medications prior to visit.    PAST MEDICAL HISTORY: Past Medical History  Diagnosis Date  . Multiple sclerosis ([place])   . MS (multiple sclerosis) ([place])   . Concussion   . Movement disorder   . Neuropathy ([place])   . Vision abnormalities     PAST SURGICAL HISTORY: Past Surgical History  Procedure Laterality Date  . Knee arthroscopy    . I&d extremity  05/30/2011    Procedure: IRRIGATION AND DEBRIDEMENT EXTREMITY;  Surgeon: Tennis Must, MD;  Location: [facility];  Service: Orthopedics;;  Incision and drainage of open Proximal interphalangeal joint with closed reduction of Proximal interphalangeal  joint left long finger  . Fracture surgery      FAMILY HISTORY: Family History  Problem Relation Age of Onset  . Cancer Mother   . Hypertension Mother   . Cancer Father   . Hyperlipidemia Father   . Diabetes Father     SOCIAL HISTORY:  Social History   Social History  . Marital Status: Married    Spouse Name: N/A  . Number of Children: N/A  . Years of Education: N/A   Occupational History  . Not on file.   Social History Main Topics  . Smoking status: Former Smoker -- 0.50 packs/day  . Smokeless tobacco: Not on file  . Alcohol Use: 0.0 oz/week     Comment: daily  . Drug Use: No  . Sexual Activity: Not on file   Other Topics Concern  . Not on file   Social History Narrative     PHYSICAL EXAM  Filed Vitals:   02/06/15 1431  BP: 142/100  Pulse: 86  Resp: 16  Height: 5\' 4"  (1.626 m)  Weight: 152 lb (68.947 kg)    Body mass index is 26.08 kg/(m^2).   General: The patient is well-developed and well-nourished and in mild distress.   Musculoskeletal/Skin:   She has mild bruising in left flank.   She is tender over 5th  - 8 ribs on th eleft.  Neurologic Exam  Mental status: The patient is alert and oriented x 3 at the time of the examination. The patient has apparent normal recent and remote memory, with an apparently normal attention span and concentration ability.   Speech is normal.  Cranial nerves: Extraocular movements are full.    There is good facial sensation to soft touch bilaterally.Facial strength is normal.      Motor:  Muscle bulk is normal. She has mild right and moderate left increased tone. Strength is 4-/5 in the left leg hip flexor and 4/5 in the right leg. Strength is  4+ over 5 in the lower left leg.  Sensory: Sensory testing is intact to touch and vibration sensation on all 4 extremities..   Coordination: Cerebellar testing reveals good finger-nose-finger and poor heel-to-shin (left worse than right)   Gait and station: Station is stable and gait is spastic on the left with wide stance and reduced stride   She cannot do a tandem walk.    Reflexes: Deep tendon reflexes are symmetric and normal in the arms but she has increased reflexes in both legs with nonsustained clonus at the left ankle.     DIAGNOSTIC DATA (LABS, IMAGING, TESTING) - I reviewed patient records, labs, notes, testing and imaging myself where available.     ASSESSMENT AND PLAN   Multiple sclerosis, primary progressive (HCC)  Spastic diplegia (HCC)  Foot drop, left  Numbness  Other fatigue  Spastic gait  Depression   1.    Write out of work until 03/04/15 due to pain, decreased ability to [employer].    2.   Hydrocodone tid/.    Miralax for constipation 3.   She will return to see me in 4 months, sooner if she has new or worsening neurologic symptoms.      Richard A. Felecia Shelling, MD, PhD [id number], [id number] PM Certified in Neurology, Clinical Neurophysiology, Sleep Medicine, Pain Medicine and Neuroimaging  [facility] [address], [place], [place] [address] [phone]

## 2015-02-07 ENCOUNTER — Telehealth: Payer: Self-pay | Admitting: *Deleted

## 2015-02-07 NOTE — Telephone Encounter (Signed)
I spoke with Dr. Isac Caddy about Taylor Hancock.   She reports concerns about worsening alcohol abuse over the past few months and she reports she has been more epressed and less coherent at times.   They are trying to get her and her husband in with an ETOH counselor in [facility], Dr. Domingo Cocking.
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
## 2015-02-19 ENCOUNTER — Telehealth: Payer: Self-pay | Admitting: Neurology

## 2015-02-19 NOTE — Telephone Encounter (Signed)
Patient called regarding short term disability due to "bad fall", Dr. Felecia Shelling has released her to go back to work on 03/04/15, patient states "she needs another week".

## 2015-02-19 NOTE — Telephone Encounter (Signed)
I have spoken with Taylor Hancock,and per RAS, advised that I can complete a new set of fmla paperwork for her, to put her out thru 03-11-15.  She will fax new paperwork to me at [phone]

## 2015-02-20 NOTE — Telephone Encounter (Signed)
I have spoken with Taylor Hancock.  She now sts. she doesn't feel she will be ready to return to work until 03-18-15.  She will have fmla paperwork faxed to me/fiim

## 2015-02-20 NOTE — Telephone Encounter (Signed)
Pt called said she needs an additional 2 more weeks out of work. She is not wanting to go into detail.

## 2015-02-21 ENCOUNTER — Telehealth: Payer: Self-pay | Admitting: *Deleted

## 2015-02-21 NOTE — Telephone Encounter (Signed)
I faxed medical records to [facility] on 02/21/15.

## 2015-02-25 ENCOUNTER — Encounter: Payer: Self-pay | Admitting: *Deleted

## 2015-02-25 NOTE — Telephone Encounter (Signed)
Pt called said [facility] just needs letter from provider saying due to more healing time pt needs additional 2 weeks out from original date. She said this would cover FMLA and short disability.  pls fax to # [phone]

## 2015-02-25 NOTE — Telephone Encounter (Signed)
I have spoken with Taylor Hancock--she sts. she doesn't feel she can return to work before 03-19-15. Will check with RAS and fax appropriate letter to [facility].  Will only call [name] back if RAS doesn't approve her to be out of work until 2-28/fim

## 2015-02-25 NOTE — Telephone Encounter (Signed)
Letter stating pt. is unable to return to work prior to 03-19-15 faxed to [employer] # [phone] as requested/fim

## 2015-03-18 ENCOUNTER — Telehealth: Payer: Self-pay | Admitting: Neurology

## 2015-03-18 NOTE — Telephone Encounter (Signed)
Pt called said she faxed a return to work form, she is to return to work tomorrow and it is imperative it gets faxed back today. She faxed to the  number you gave her since she has been out of work.

## 2015-03-18 NOTE — Telephone Encounter (Signed)
RAS has completed fmla form.  I have spoken with Aleycia and advised form will be faxed  back to her employer this afternoon/fim

## 2015-04-01 ENCOUNTER — Telehealth: Payer: Self-pay | Admitting: Neurology

## 2015-04-01 NOTE — Telephone Encounter (Signed)
Pt needs new rx clonazePAM (KLONOPIN) 1 MG tablet. Thank you

## 2015-04-01 NOTE — Telephone Encounter (Signed)
LMOM (identified vm) that last rx. given, in December, was for 1 90 day supply with one additonal r/f.  She should check with  her pharmacy for r/f/fim

## 2015-05-03 ENCOUNTER — Ambulatory Visit: Payer: 59 | Admitting: Neurology

## 2015-05-26 ENCOUNTER — Other Ambulatory Visit: Payer: Self-pay | Admitting: Neurology

## 2015-06-05 ENCOUNTER — Telehealth: Payer: Self-pay | Admitting: Neurology

## 2015-06-05 NOTE — Telephone Encounter (Signed)
Patient is calling. She needs to discuss not sleeping and going off tiZANidine (ZANAFLEX) 4 MG tablet. Please call to discuss.

## 2015-06-05 NOTE — Telephone Encounter (Signed)
I have spoken with Taylor Hancock this morning.  She would like to discuss mult. med changes, which would be best documented w/i an office visit.  Appt. given 06-10-15 at [place], with arrival time of 0820/fim

## 2015-06-10 ENCOUNTER — Encounter: Payer: Self-pay | Admitting: Neurology

## 2015-06-10 ENCOUNTER — Ambulatory Visit (INDEPENDENT_AMBULATORY_CARE_PROVIDER_SITE_OTHER): Payer: 59 | Admitting: Neurology

## 2015-06-10 VITALS — BP 116/84 | HR 70 | Resp 16 | Ht 64.0 in | Wt 143.4 lb

## 2015-06-10 DIAGNOSIS — G4700 Insomnia, unspecified: Secondary | ICD-10-CM | POA: Diagnosis not present

## 2015-06-10 DIAGNOSIS — R350 Frequency of micturition: Secondary | ICD-10-CM

## 2015-06-10 DIAGNOSIS — G801 Spastic diplegic cerebral palsy: Secondary | ICD-10-CM

## 2015-06-10 DIAGNOSIS — F329 Major depressive disorder, single episode, unspecified: Secondary | ICD-10-CM

## 2015-06-10 DIAGNOSIS — F32A Depression, unspecified: Secondary | ICD-10-CM

## 2015-06-10 DIAGNOSIS — R261 Paralytic gait: Secondary | ICD-10-CM

## 2015-06-10 DIAGNOSIS — G35 Multiple sclerosis: Secondary | ICD-10-CM

## 2015-06-10 DIAGNOSIS — M21372 Foot drop, left foot: Secondary | ICD-10-CM | POA: Diagnosis not present

## 2015-06-10 DIAGNOSIS — R5383 Other fatigue: Secondary | ICD-10-CM | POA: Diagnosis not present

## 2015-06-10 MED ORDER — DIAZEPAM 5 MG PO TABS: ORAL_TABLET | ORAL | Status: DC

## 2015-06-10 MED ORDER — NITROFURANTOIN MACROCRYSTAL 100 MG PO CAPS: 100.0000 mg | ORAL_CAPSULE | Freq: Two times a day (BID) | ORAL | Status: DC

## 2015-06-10 NOTE — Progress Notes (Signed)
[facility]  PATIENT: Taylor Hancock DOB: 1960-12-30  REFERRING CLINICIAN: Shirline Frees   HISTORY FROM: Patient REASON FOR VISIT: PPMS   HISTORICAL  CHIEF COMPLAINT:  Chief Complaint  Patient presents with  . Multiple Sclerosis    1--Thinks she currently has a uti--urine is discolored.  2--Sts. she had vivid dreaming while taking Tizanidine, so she stopped it.  Now is having more trouble sleeping and requests rx. for Xanax. 3--She stopped Lexapro, does not want another med for depression./fim     [facility]  PATIENT: Taylor Hancock DOB: 1960-12-11  REFERRING CLINICIAN: Shirline Frees   HISTORY FROM: Patient REASON FOR VISIT: PPMS   HISTORICAL  CHIEF COMPLAINT:  Chief Complaint  Patient presents with  . Multiple Sclerosis    1--Thinks she currently has a uti--urine is discolored.  2--Sts. she had vivid dreaming while taking Tizanidine, so she stopped it.  Now is having more trouble sleeping and requests rx. for Xanax. 3--She stopped Lexapro, does not want another med for depression./fim    HISTORY OF PRESENT ILLNESS:  Taylor Hancock is a 55 year old woman with MS diagnosed in 2005.   Her course has been predominantly primary progressive with worsening spasticity and weakness, left worse than right, causing a poor gait.    We discussed ocrelizumab.  She should qualify for the drug but has some concerns due to the mismatch in breast cancer (more seen on ocrelizumab)   Gait/strength:    She has progressive left > right leg weakness and gait worsening.  She has spasticity.   She uses a Radiation protection practitioner.   No recent falls (bad fall in January with broken ribs).   She has a left Walk-Aide for foot drop.    Left leg is more spastic and sometimes painful.    She stopped tizanidine 4 mg at night due to vivid dreams.   She is still on 5 mg of cyclobenzaprine and 1-2 mg of clonazepam at bedtime. She can't tolerate these medications during the day.   She had difficulty tolerating baclofen in the past.   Due to risk of side effects, she did not want to try Ampyra in the past. Her left foot drop does better with a Walk-Aide Neurostimulator on the left.    Sensory:  She has right > left trunk dysesthetic sensations but they are better than they were in the past.  .   She also has constant numbness in her hands.  She sometimes drops items out of her hands. Typing is more difficult. Both of her feet feel numb but not painful  Vision:  She denies major difficulties with her vision but does note that she has reduced depth perception compared to the past. She does not feel safe driving at night anymore.  Bowel/Bladder:   She is concerned she might have a UTI as urine color is different. She sometimes has bladder problems.    She reports urinary frequency and urgency. She has occasional incontinence when she can't get to the bathroom in time. She sometimes hesitancy and she is not sure that she completely empties   She notes bowel incontinence at times.    Fatigue/sleep;  She is more fatigued this year, especially the past few months.  The fatigue is both mental and physical.   She has more difficulty focusing as the day goes on. She also gets tired more easily than she used to with physical tasks. Mood has been worse over the past few months. She  notes some changes at work and more work has been pushed her way.She is not able to stand as long as she used to and starts slouching over after a few minutes.    She has never tried anything for her fatigue.    Sleep is worse and is now un- refreshed.    She is snoring more but husband sleeps soundly and has not noted any pauses or gasping.   She falls asleep easily but wakes up and stays awake worrying.  She does not want to take a stimulant  Mood:    Mood is worse with more depression. She is noting more apathy and now is sleeping poorly.   She is not having tearfulness.    She stopped escitalopram.    She has  stress with her husbands mother dying and them having to handle the financial issues.    She has greatly reduced her drinking to just one or two on a weekend.    Cognitive:   She feels cognition is similar.   She is more forgetful when she is more tired (end of day) or if hot.. She has occasional difficulties with verbal fluency. She has some issues with in executive function and has more difficulty with planning and doing complicated tasks.  MS History:  She was diagnosed with multiple sclerosis in 2005 after presenting with several years of worsening gait. Initially, she was diagnosed with relapsing remitting MS and was placed on Betaseron. She had difficulty tolerating Betaseron but did continue to take it. Her first MRI after the diagnosis was unchanged from her previous one. A couple years later she transferred her care to me. After review of her time course of disease, it was apparent that she had a progressive MS and not a relapsing form of MS. Therefore, the Betaseron was discontinued.    REVIEW OF SYSTEMS:  Constitutional: No fevers, chills, sweats, or change in appetite.  Notes fatigue.   Has insomnia Eyes: No visual changes, double vision, eye pain Ear, nose and throat: No hearing loss, ear pain, nasal congestion, sore throat.    Cardiovascular: No chest pain, palpitations Respiratory:  No shortness of breath at rest or with exertion.   No wheezes GastrointestinaI: No nausea, vomiting, diarrhea, abdominal pain, fecal incontinence Genitourinary:  as above Musculoskeletal:  No neck pain, back pain Integumentary: No rash, pruritus, skin lesions Neurological: as above Psychiatric: Increased depression   Endocrine: No palpitations, diaphoresis, change in appetite, change in weigh or increased thirst Hematologic/Lymphatic:  No anemia, purpura, petechiae. Allergic/Immunologic: No itchy/runny eyes, nasal congestion, recent allergic reactions, rashes  ALLERGIES: Allergies  Allergen  Reactions  . Levaquin [Levofloxacin In D5w]   . Ppa-Mma Express [Alitraq] Nausea And Vomiting    Blood in vomit and back felt like it was breaking.   Marland Kitchen Zithromax [Azithromycin] Other (See Comments)    abd cramping  . Ultram [Tramadol Hcl] Anxiety    HOME MEDICATIONS: Outpatient Prescriptions Prior to Visit  Medication Sig Dispense Refill  . clonazePAM (KLONOPIN) 1 MG tablet Take 4 tablets (4 mg total) by mouth at bedtime. 360 tablet 1  . cyclobenzaprine (FLEXERIL) 5 MG tablet TAKE 1 TO 2 TABLETS AT BEDTIME 60 tablet 11  . tiZANidine (ZANAFLEX) 4 MG tablet TAKE 1 to 2 TABLETS BEDTIME 60 tablet 3  . clobetasol (TEMOVATE) 0.05 % external solution Reported on 06/10/2015  5  . escitalopram (LEXAPRO) 5 MG tablet Take 5 mg by mouth daily. Reported on 06/10/2015    . HYDROcodone-acetaminophen (  NORCO/VICODIN) 5-325 MG tablet Take 1 tablet by mouth every 6 (six) hours as needed for moderate pain. (Patient not taking: Reported on 06/10/2015) 90 tablet 0  . chlorpheniramine-HYDROcodone (TUSSIONEX PENNKINETIC ER) 10-8 MG/5ML SUER Take 5 mLs by mouth every 12 (twelve) hours as needed for cough. (Patient not taking: Reported on 02/06/2015) 115 mL 0   No facility-administered medications prior to visit.    PAST MEDICAL HISTORY: Past Medical History  Diagnosis Date  . Multiple sclerosis ([place])   . MS (multiple sclerosis) ([place])   . Concussion   . Movement disorder   . Neuropathy ([place])   . Vision abnormalities     PAST SURGICAL HISTORY: Past Surgical History  Procedure Laterality Date  . Knee arthroscopy    . I&d extremity  05/30/2011    Procedure: IRRIGATION AND DEBRIDEMENT EXTREMITY;  Surgeon: Tennis Must, MD;  Location: [facility];  Service: Orthopedics;;  Incision and drainage of open Proximal interphalangeal joint with closed reduction of Proximal interphalangeal  joint left long finger  . Fracture surgery      FAMILY HISTORY: Family History  Problem Relation Age of Onset  . Cancer Mother     . Hypertension Mother   . Cancer Father   . Hyperlipidemia Father   . Diabetes Father     SOCIAL HISTORY:  Social History   Social History  . Marital Status: Married    Spouse Name: N/A  . Number of Children: N/A  . Years of Education: N/A   Occupational History  . Not on file.   Social History Main Topics  . Smoking status: Former Smoker -- 0.50 packs/day  . Smokeless tobacco: Not on file  . Alcohol Use: 0.0 oz/week     Comment: daily  . Drug Use: No  . Sexual Activity: Not on file   Other Topics Concern  . Not on file   Social History Narrative     PHYSICAL EXAM  Filed Vitals:   06/10/15 0852  BP: 116/84  Pulse: 70  Resp: 16  Height: 5\' 4"  (1.626 m)  Weight: 143 lb 6.4 oz (65.046 kg)    Body mass index is 24.6 kg/(m^2).   General: The patient is well-developed and well-nourished and in mild distress.   Neurologic Exam  Mental status: The patient is alert and oriented x 3 at the time of the examination. The patient has apparent normal recent and remote memory, with an apparently normal attention span and concentration ability.   Speech is normal.  Cranial nerves: Extraocular movements are full.    There is good facial sensation to soft touch bilaterally.Facial strength is normal.      Motor:  Muscle bulk is normal. She has mild right and moderate left increased tone. Strength is 4-/5 in the left leg hip flexor and 4/5 in the right leg. Strength is 4-4+ over 5 in the lower left leg.  Sensory: Sensory testing is intact to touch and vibration sensation on all 4 extremities..   Coordination: Cerebellar testing reveals good finger-nose-finger and poor heel-to-shin (left worse than right)   Gait and station: Station is stable and gait is spastic on the left with wide stance and reduced stride   She cannot do a tandem walk.    Reflexes: Deep tendon reflexes are symmetric and normal in the arms but she has increased reflexes in both legs with nonsustained  clonus at the left ankle.     DIAGNOSTIC DATA (LABS, IMAGING, TESTING) - I reviewed patient records, labs,  notes, testing and imaging myself where available.     ASSESSMENT AND PLAN   Multiple sclerosis, primary progressive ([employer])  Urinary frequency - Plan: Urinalysis with Reflex Microscopic, Culture, Urine  Spastic diplegia (H[facility])  Foot drop, left  Other fatigue  Spastic gait  Depression  Insomnia     1.   We'll long discussion about the advantages and disadvantages of ocrelizumab based on the phase 3 studies.    It is FDA approved for primary progressive MS. In many ways, she would be an ideal candidate for ocrelizumab because she is ambulatory. She is concerned about the possible risk of breast cancer and will reconsider ocrelizumab next year. 2.   She has a lot of of insomnia. I will switch her from clonazepam to Valium and hope that that will kick in her for her. 3.   She will return to see me in 4 months, sooner if she has new or worsening neurologic symptoms.      40 minutes face-to-face evaluation with greater than one half of the time counseling and coordinating care about disease modifying therapies for her primary progressive MS and her MS symptoms.  Avory Mimbs A. Felecia Shelling, MD, PhD [id number], [id number] AM Certified in Neurology, Clinical Neurophysiology, Sleep Medicine, Pain Medicine and Neuroimaging  [facility] [address] [place], [place] [address] [phone]

## 2015-06-10 NOTE — Patient Instructions (Signed)
Biotin    3 x 100 mg pills a day        Can get from [email or web address]    Only $40-45 for 90 pills

## 2015-06-11 ENCOUNTER — Telehealth: Payer: Self-pay | Admitting: Neurology

## 2015-06-11 DIAGNOSIS — M21371 Foot drop, right foot: Secondary | ICD-10-CM

## 2015-06-11 DIAGNOSIS — M21372 Foot drop, left foot: Secondary | ICD-10-CM

## 2015-06-11 DIAGNOSIS — G35 Multiple sclerosis: Secondary | ICD-10-CM

## 2015-06-11 LAB — URINALYSIS, ROUTINE W REFLEX MICROSCOPIC
BILIRUBIN UA: NEGATIVE
GLUCOSE, UA: NEGATIVE
Ketones, UA: NEGATIVE
LEUKOCYTES UA: NEGATIVE
Nitrite, UA: NEGATIVE
PH UA: 6 (ref 5.0–7.5)
PROTEIN UA: NEGATIVE
RBC UA: NEGATIVE
Specific Gravity, UA: 1.025 (ref 1.005–1.030)
UUROB: 0.2 mg/dL (ref 0.2–1.0)

## 2015-06-11 NOTE — Telephone Encounter (Signed)
Patient is calling and states she needs an electronic Rx sent to [facility] for electrodes for her walk aid system.  Phone [phone]. Fax [phone].

## 2015-06-11 NOTE — Telephone Encounter (Signed)
DME order for walk aide electrodes faxed to [facility] clinic fax # [phone]

## 2015-06-12 ENCOUNTER — Telehealth: Payer: Self-pay | Admitting: *Deleted

## 2015-06-12 LAB — URINE CULTURE

## 2015-06-12 NOTE — Telephone Encounter (Signed)
Message For: [facility]                  Taken 24-MAY-17 at  4:27PM by DDJ ------------------------------------------------------------ Gladstone Lighter              CID [id number]  Patient SAME                 Pt's Dr Felecia Shelling        Area Code 336 Phone# [phone]     [place]                                  Disp:Y/N N If Y = C/B If No Response In 3minutes ============================================================

## 2015-06-12 NOTE — Telephone Encounter (Signed)
I have spoken with Robina this afternoon and advised that I faxed rx. for walkaide electrodes to the [facility] yesterday/fim

## 2015-06-12 NOTE — Telephone Encounter (Signed)
LMOM that per RAS, u/a showed no uti; she can stop meds for uti. She does not need to return this call unless she has questions/fim

## 2015-06-12 NOTE — Telephone Encounter (Signed)
-----   Message from Britt Bottom, MD sent at 06/12/2015  3:33 PM EDT ----- There is no bacterial infection. She can stop the medication for UTI

## 2015-06-20 NOTE — Telephone Encounter (Signed)
Quantity changed to 10 and dme order refaxed, with fax confirmation received/fim

## 2015-06-20 NOTE — Telephone Encounter (Signed)
Debra/[facility] [phone] called said RX was rec'd but qty was for 1 and it needs to be 10. Please refax

## 2015-06-24 ENCOUNTER — Telehealth: Payer: Self-pay | Admitting: *Deleted

## 2015-06-24 ENCOUNTER — Telehealth: Payer: Self-pay | Admitting: Neurology

## 2015-06-24 MED ORDER — CLONAZEPAM 1 MG PO TABS: ORAL_TABLET | ORAL | Status: DC

## 2015-06-24 NOTE — Telephone Encounter (Signed)
Clonazepam rx. [facility]ed to [facility]/fim

## 2015-06-24 NOTE — Telephone Encounter (Signed)
Pt called, has question about her refill. Please advise [phone].

## 2015-06-24 NOTE — Telephone Encounter (Signed)
I have spoken with Taylor Hancock this afternoon.  She sts. Diazepam did not help with insomnia, so she stopped it; would like to resume Clonazepam 4mg  qhs.  Per RAS, this is ok, but must clarify that she has stopped Diazepam.  Taylor Hancock is clear that she has stopped Diazepam and will not restart it.  [facility]epam [facility] per her request/fim

## 2015-06-24 NOTE — Telephone Encounter (Signed)
Patient is calling to order Rx clonazepam 1 mg tablets 4 X day sent to [facility] [place], [employer],  Thanks!

## 2015-06-24 NOTE — Telephone Encounter (Signed)
LMOM (identified vm).  At last ov, RAS switched her from Clonazepam to Diazepam to give added help with insomnia./fim

## 2015-08-07 ENCOUNTER — Encounter: Payer: Self-pay | Admitting: Neurology

## 2015-08-07 ENCOUNTER — Ambulatory Visit (INDEPENDENT_AMBULATORY_CARE_PROVIDER_SITE_OTHER): Payer: 59 | Admitting: Neurology

## 2015-08-07 VITALS — BP 122/84 | HR 84 | Resp 18 | Ht 64.0 in | Wt 145.0 lb

## 2015-08-07 DIAGNOSIS — R261 Paralytic gait: Secondary | ICD-10-CM | POA: Diagnosis not present

## 2015-08-07 DIAGNOSIS — R5383 Other fatigue: Secondary | ICD-10-CM | POA: Diagnosis not present

## 2015-08-07 DIAGNOSIS — F329 Major depressive disorder, single episode, unspecified: Secondary | ICD-10-CM

## 2015-08-07 DIAGNOSIS — R350 Frequency of micturition: Secondary | ICD-10-CM

## 2015-08-07 DIAGNOSIS — G4700 Insomnia, unspecified: Secondary | ICD-10-CM | POA: Diagnosis not present

## 2015-08-07 DIAGNOSIS — G35 Multiple sclerosis: Secondary | ICD-10-CM

## 2015-08-07 DIAGNOSIS — M21372 Foot drop, left foot: Secondary | ICD-10-CM

## 2015-08-07 DIAGNOSIS — R200 Anesthesia of skin: Secondary | ICD-10-CM

## 2015-08-07 DIAGNOSIS — G801 Spastic diplegic cerebral palsy: Secondary | ICD-10-CM | POA: Diagnosis not present

## 2015-08-07 DIAGNOSIS — F32A Depression, unspecified: Secondary | ICD-10-CM

## 2015-08-07 MED ORDER — TIZANIDINE HCL 4 MG PO TABS: 8.0000 mg | ORAL_TABLET | Freq: Every day | ORAL | Status: DC

## 2015-08-07 NOTE — Progress Notes (Signed)
[facility]  PATIENT: Taylor Hancock DOB: [birth year]  REFERRING CLINICIAN: Shirline Frees   HISTORY FROM: Patient REASON FOR VISIT: PPMS   HISTORICAL  CHIEF COMPLAINT:  Chief Complaint  Patient presents with  . Multiple Sclerosis    She is not on any MS med. Sts. Valium did not help sleep, so she stopped it and restarted Clonazepam.  Sts. she often drops Clonazepam b/c hands are not steady, so she would like to discuss increasing the quantity to 5 per day, to account for the tablets she loses.   She has fmla paperwork to be completed today/fim     [facility]  PATIENT: Taylor Hancock DOB: 05/14/1960  REFERRING CLINICIAN: Shirline Frees   HISTORY FROM: Patient REASON FOR VISIT: PPMS   HISTORICAL  CHIEF COMPLAINT:  Chief Complaint  Patient presents with  . Multiple Sclerosis    She is not on any MS med. Sts. Valium did not help sleep, so she stopped it and restarted Clonazepam.  Sts. she often drops Clonazepam b/c hands are not steady, so she would like to discuss increasing the quantity to 5 per day, to account for the tablets she loses.   She has fmla paperwork to be completed today/fim    HISTORY OF PRESENT ILLNESS:  Taylor Hancock is a 55 year old woman with MS diagnosed in 2005.   Her course has been predominantly primary progressive with worsening spasticity and weakness, left worse than right, causing a poor gait.  SHe feels her gait is worse since a fall where she injured her ankle.       Gait/strength:    She has progressive left > right leg weakness and gait worsening.  She has spasticity.   She uses a Radiation protection practitioner. She feels she freezes a lot.    No recent falls (bad fall in January with broken ribs).   She is not able to stand as long as she used to and starts slouching over after a few minutes. She has a left Walk-Aide for foot drop.    Left leg is more spastic and sometimes painful.  Clonazepam at bedtime helps sleep and  spasticity.   Tizanidine also helped... She stopped due to more vivid dreams but wishes to go back on. . She can't tolerate these medications during the day.  She had difficulty tolerating baclofen in the past.   Due to risk of side effects, she did not want to try Ampyra in the past. Her left foot drop does better with a Walk-Aide Neurostimulator on the left.    Sensory:  She still has right > left trunk dysesthetic sensations and she has constant numbness in her hands.  She sometimes drops items out of her hands. Typing is more difficult. Both of her feet feel numb but not painful  Vision:  She denies major difficulties with her vision but does note that she has reduced depth perception compared to the past. She does not feel safe driving at night anymore.  Bowel/Bladder:    She reports urinary frequency and urgency. She has occasional incontinence when she can't get to the bathroom in time. She sometimes hesitancy and she is not sure that she completely empties   She notes bowel incontinence at times, often she gets the urge with only a minute notice.     Fatigue/sleep;  She has fatigue, worse with heat.   The fatigue is both mental and physical.   Cognitivey, she has more difficulty focusing as the day  goes on and this affects function at work..    She sleeps ok most nights.    She is snoring some but husband has not noted any pauses or gasping.   She falls asleep easily.     Mood:    Mood is worse with more depression. She is noting more apathy and now is sleeping poorly.   She is not having tearfulness.    She stopped escitalopram.    She has stress with her husbands mother dying and them having to handle the financial issues.    She has greatly reduced her drinking to just one or two on a weekend.    Cognitive:   She feels cognition is similar to last visit.   She is more forgetful in the afternoon.. She has occasional difficulties with verbal fluency. She has some issues with in executive  function and has more difficulty with planning and doing complicated tasks.   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  MS History:  She had a [place] sign in the 1990's a few times and she felt her neck was weaker at that time.  However, gait was not affected at that time.  She was diagnosed with multiple sclerosis in 2005 after presenting with several years of worsening gait. Initially, she was diagnosed with relapsing remitting MS and was placed on Betaseron. She had difficulty tolerating Betaseron but did continue to take it. Her first MRI after the diagnosis was unchanged from her previous one. A couple years later she transferred her care to me. After review of her time course of disease, it was apparent that she had a progressive MS and not a relapsing form of MS. Therefore, the Betaseron was discontinued.    REVIEW OF SYSTEMS:  Constitutional: No fevers, chills, sweats, or change in appetite.  Notes fatigue.   Has insomnia Eyes: No visual changes, double vision, eye pain Ear, nose and throat: No hearing loss, ear pain, nasal congestion, sore throat.    Cardiovascular: No chest pain, palpitations Respiratory:  No shortness of breath at rest or with exertion.   No wheezes GastrointestinaI: No nausea, vomiting, diarrhea, abdominal pain, fecal incontinence Genitourinary:  as above Musculoskeletal:  No neck pain, back pain Integumentary: No rash, pruritus, skin lesions Neurological: as above Psychiatric: Increased depression   Endocrine: No palpitations, diaphoresis, change in appetite, change in weigh or increased thirst Hematologic/Lymphatic:  No anemia, purpura, petechiae. Allergic/Immunologic: No itchy/runny eyes, nasal congestion, recent allergic reactions, rashes  ALLERGIES: Allergies  Allergen Reactions  . Levaquin [Levofloxacin In D5w]   . Ppa-Mma Express [Alitraq] Nausea And Vomiting    Blood in vomit and back felt like it was breaking.   Marland Kitchen Zithromax [Azithromycin] Other  (See Comments)    abd cramping  . Ultram [Tramadol Hcl] Anxiety    HOME MEDICATIONS: Outpatient Prescriptions Prior to Visit  Medication Sig Dispense Refill  . clonazePAM (KLONOPIN) 1 MG tablet Take 4 tablets at bedtime. 360 tablet 1  . cyclobenzaprine (FLEXERIL) 5 MG tablet TAKE 1 TO 2 TABLETS AT BEDTIME 60 tablet 11  . clobetasol (TEMOVATE) 0.05 % external solution Reported on 06/10/2015  5  . diazepam (VALIUM) 5 MG tablet One or two at bedtime 60 tablet 5  . HYDROcodone-acetaminophen (NORCO/VICODIN) 5-325 MG tablet Take 1 tablet by mouth every 6 (six) hours as needed for moderate pain. (Patient not taking: Reported on 06/10/2015) 90 tablet 0  . nitrofurantoin (MACRODANTIN) 100 MG capsule Take 1 capsule (100 mg total)  by mouth 2 (two) times daily. 20 capsule 0   No facility-administered medications prior to visit.    PAST MEDICAL HISTORY: Past Medical History  Diagnosis Date  . Multiple sclerosis ([place])   . MS (multiple sclerosis) ([place])   . Concussion   . Movement disorder   . Neuropathy ([place])   . Vision abnormalities     PAST SURGICAL HISTORY: Past Surgical History  Procedure Laterality Date  . Knee arthroscopy    . I&d extremity  05/30/2011    Procedure: IRRIGATION AND DEBRIDEMENT EXTREMITY;  Surgeon: Tennis Must, MD;  Location: [facility];  Service: Orthopedics;;  Incision and drainage of open Proximal interphalangeal joint with closed reduction of Proximal interphalangeal  joint left long finger  . Fracture surgery      FAMILY HISTORY: Family History  Problem Relation Age of Onset  . Cancer Mother   . Hypertension Mother   . Cancer Father   . Hyperlipidemia Father   . Diabetes Father     SOCIAL HISTORY:  Social History   Social History  . Marital Status: Married    Spouse Name: N/A  . Number of Children: N/A  . Years of Education: N/A   Occupational History  . Not on file.   Social History Main Topics  . Smoking status: Former Smoker -- 0.50 packs/day  .  Smokeless tobacco: Not on file  . Alcohol Use: 0.0 oz/week     Comment: daily  . Drug Use: No  . Sexual Activity: Not on file   Other Topics Concern  . Not on file   Social History Narrative     PHYSICAL EXAM  Filed Vitals:   08/07/15 1509  BP: 122/84  Pulse: 84  Resp: 18  Height: 5\' 4"  (1.626 m)  Weight: 145 lb (65.772 kg)    Body mass index is 24.88 kg/(m^2).   General: The patient is well-developed and well-nourished and in mild distress.   Neurologic Exam  Mental status: The patient is alert and oriented x 3 at the time of the examination. The patient has apparent normal recent and remote memory, with an apparently normal attention span and concentration ability.   Speech is normal.  Cranial nerves: Extraocular movements are full.    There is good facial sensation to soft touch bilaterally.Facial strength is normal.      Motor:  Muscle bulk is normal. She has mild right and moderate left increased tone. Strength is 4-/5 in the left leg hip flexor and 4/5 in the right leg. Strength is 4/5 in the lower left leg; 5/5 lower right leg.  Sensory: Sensory testing is intact to touch and vibration sensation on all 4 extremities..   Coordination: Cerebellar testing reveals good finger-nose-finger and poor heel-to-shin (left worse than right)   Gait and station: Station is stable and gait is spastic on the left with wide stance and reduced stride   Can take a few steps without support only.   She cannot do a tandem walk.    Reflexes: Deep tendon reflexes are symmetric and normal in the arms but she has increased reflexes in both legs with nonsustained clonus at the left ankle.     DIAGNOSTIC DATA (LABS, IMAGING, TESTING) - I reviewed patient records, labs, notes, testing and imaging myself where available.     ASSESSMENT AND PLAN   Multiple sclerosis, primary progressive (HCC)  Spastic diplegia (HCC)  Foot drop, left  Insomnia  Numbness  Depression  Other  fatigue  Spastic  gait  Urinary frequency     1.   We discussed the advantages and disadvantages of ocrelizumab based on the phase 3 studies.    It is FDA approved for primary progressive MS.   She is concerned about the possible risk of breast cancer (strong FH or Breast cancer) but she would reconsider ocrelizumab next year once more people are on it. 2.   Continue clonazepam for insomnia. 3.   She will return to see me in 4 months, sooner if she has new or worsening neurologic symptoms.      40 minute face to face evaluation with > 1/2 the time counseling or coordinating care about her MS and symptoms  Annabelle Rexroad A. Felecia Shelling, MD, PhD [id number], [id number] PM Certified in Neurology, Clinical Neurophysiology, Sleep Medicine, Pain Medicine and Neuroimaging  [facility] [address], [place], [place] [address] [phone]'

## 2015-08-09 ENCOUNTER — Telehealth: Payer: Self-pay | Admitting: Neurology

## 2015-08-09 NOTE — Telephone Encounter (Signed)
Patient is calling to discuss FMLA paperwork that was to be filled out.

## 2015-08-09 NOTE — Telephone Encounter (Signed)
I have spoken with Taylor Hancock this morning and advised FMLA paperwork was mailed back to company in envelope she provided/fim

## 2015-09-16 ENCOUNTER — Telehealth: Payer: Self-pay | Admitting: Neurology

## 2015-09-16 NOTE — Telephone Encounter (Signed)
Patient is calling to schedule an MRI. °

## 2015-09-18 NOTE — Telephone Encounter (Signed)
Patient called to schedule MRI, please call [phone].

## 2015-09-27 NOTE — Telephone Encounter (Signed)
Patient is scheduled with [facility].

## 2015-10-09 ENCOUNTER — Ambulatory Visit
Admission: RE | Admit: 2015-10-09 | Discharge: 2015-10-09 | Disposition: A | Discharge status: Home / Self Care | Payer: 59 | Source: Ambulatory Visit | Attending: Neurology | Admitting: Neurology

## 2015-10-09 ENCOUNTER — Ambulatory Visit: Payer: 59 | Admitting: Neurology

## 2015-10-09 DIAGNOSIS — R261 Paralytic gait: Secondary | ICD-10-CM

## 2015-10-09 DIAGNOSIS — G35 Multiple sclerosis: Secondary | ICD-10-CM

## 2015-10-09 MED ORDER — GADOBENATE DIMEGLUMINE 529 MG/ML IV SOLN
13.0000 mL | Freq: Once | INTRAVENOUS | Status: AC | PRN
  Administered 2015-10-09: 13 mL via INTRAVENOUS

## 2015-10-10 ENCOUNTER — Encounter: Payer: Self-pay | Admitting: Genetic Counselor

## 2015-10-10 ENCOUNTER — Telehealth: Payer: Self-pay | Admitting: Genetic Counselor

## 2015-10-10 NOTE — Telephone Encounter (Signed)
Pt confirmed appt, completed intake, made aware of MyChart questions, mailed pt letter, faxed referring provider appt date/time.

## 2015-10-11 ENCOUNTER — Telehealth: Payer: Self-pay | Admitting: *Deleted

## 2015-10-11 NOTE — Telephone Encounter (Signed)
LMTC./fim 

## 2015-10-11 NOTE — Telephone Encounter (Signed)
-----   Message from Britt Bottom, MD sent at 10/11/2015  9:38 AM EDT ----- The MRI of the brain and the MRI of the cervical spine each showed one spot that was not there 11 years ago in 2006 which is pretty good. Please see if we can get a [facility] to push her last MRI of brain and spine that were performed in 2013 2014.

## 2015-10-14 ENCOUNTER — Telehealth: Payer: Self-pay | Admitting: *Deleted

## 2015-10-14 NOTE — Telephone Encounter (Signed)
Pt returned call. Please call [phone]

## 2015-10-14 NOTE — Telephone Encounter (Signed)
-----   Message from Britt Bottom, MD sent at 10/11/2015  9:38 AM EDT ----- The MRI of the brain and the MRI of the cervical spine each showed one spot that was not there 11 years ago in 2006 which is pretty good. Please see if we can get a [facility] to push her last MRI of brain and spine that were performed in 2013 2014.

## 2015-10-14 NOTE — Telephone Encounter (Signed)
See result note/fim 

## 2015-10-14 NOTE — Telephone Encounter (Signed)
I have spoken with Taylor Hancock this morning and explained that Taylor Hancock has read Taylor Hancock recent MRI's--that he compared them to the MRI she had in 2006, and there is only one new spot present, which is pretty good.  I have requested [facility] push more recent studies to [facility] so Taylor Hancock can use them for comparison as well.  Taylor Hancock verbalized understanding of same/fim

## 2015-11-19 ENCOUNTER — Telehealth: Payer: Self-pay | Admitting: Neurology

## 2015-11-19 NOTE — Telephone Encounter (Signed)
Patient is calling to discuss possibly going out on short term disability.

## 2015-11-19 NOTE — Telephone Encounter (Signed)
I have spoken with Taylor Hancock this afternoon and explained that [facility] will complete disability paperwork for her.  She sts. she has been falling more at work, more cognitive and physical slowness.  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

## 2015-11-22 ENCOUNTER — Telehealth: Payer: Self-pay | Admitting: Genetic Counselor

## 2015-11-22 NOTE — Telephone Encounter (Signed)
Spoke with pt to reschedule 11/6 appt due to counselor taking PAL, Pt will need to keep the appt if possible due to no more time off until 2018. I have in basket other provider to see if we can keep appt date for pt.

## 2015-11-25 ENCOUNTER — Other Ambulatory Visit: Payer: 59

## 2015-11-25 ENCOUNTER — Ambulatory Visit (HOSPITAL_BASED_OUTPATIENT_CLINIC_OR_DEPARTMENT_OTHER): Payer: 59 | Admitting: Genetic Counselor

## 2015-11-25 DIAGNOSIS — Z1379 Encounter for other screening for genetic and chromosomal anomalies: Secondary | ICD-10-CM | POA: Diagnosis not present

## 2015-11-25 DIAGNOSIS — Z803 Family history of malignant neoplasm of breast: Secondary | ICD-10-CM | POA: Diagnosis not present

## 2015-11-25 DIAGNOSIS — Z800 Family history of malignant neoplasm of digestive organs: Secondary | ICD-10-CM | POA: Diagnosis not present

## 2015-11-25 DIAGNOSIS — Z8041 Family history of malignant neoplasm of ovary: Secondary | ICD-10-CM | POA: Diagnosis not present

## 2015-11-26 ENCOUNTER — Encounter: Payer: Self-pay | Admitting: Genetic Counselor

## 2015-11-26 DIAGNOSIS — Z8041 Family history of malignant neoplasm of ovary: Secondary | ICD-10-CM | POA: Insufficient documentation

## 2015-11-26 DIAGNOSIS — Z800 Family history of malignant neoplasm of digestive organs: Secondary | ICD-10-CM | POA: Insufficient documentation

## 2015-11-26 DIAGNOSIS — Z803 Family history of malignant neoplasm of breast: Secondary | ICD-10-CM | POA: Insufficient documentation

## 2015-11-26 NOTE — Progress Notes (Signed)
REFERRING PROVIDER: Shirline Frees, MD [address] [place], [place] [address]   [address]  PRIMARY PROVIDER:  Shirline Frees, MD  PRIMARY REASON FOR VISIT:  1. Family history of breast cancer   2. Family history of ovarian cancer   3. Family history of pancreatic cancer      HISTORY OF PRESENT ILLNESS:   Taylor Hancock, a 55 y.o. female, was seen for a Taylor cancer genetics consultation at the request of Laurin Coder, NP due to a family history of cancer.  Taylor Hancock presents to clinic today to discuss the possibility of a hereditary predisposition to cancer, genetic testing, and to further clarify her future cancer risks, as well as potential cancer risks for family members. Taylor Hancock is a 55 y.o. female with no personal history of cancer.  She has a diagnosis of MS, which she would like to treat with hormone therapy, but based on her family history of breast cancer is not able to.  She admits struggling with remembering things due to her MS.  She wants to undergo genetic testing because she is considering going out on disability based on her MS, and wants to understand her risks for cancer based on her family history.  CANCER HISTORY:   No history exists.     HORMONAL RISK FACTORS:  Menarche was at age 62.  First live birth at age 27.  OCP use for approximately 1 years.  Ovaries intact: yes.  Hysterectomy: no.  Menopausal status: postmenopausal.  HRT use: 0 years. Colonoscopy: yes; normal. Mammogram within the last year: yes. Number of breast biopsies: 0. Up to date with pelvic exams:  yes. Any excessive radiation exposure in the past:  no  Past Medical History:  Diagnosis Date  . Concussion   . Family history of breast cancer   . Family history of ovarian cancer   . Family history of pancreatic cancer   . Movement disorder   . MS (multiple sclerosis) ([place])   . Multiple sclerosis ([place])   . Neuropathy ([place])   . Vision abnormalities     Past Surgical  History:  Procedure Laterality Date  . FRACTURE SURGERY    . I&D EXTREMITY  05/30/2011   Procedure: IRRIGATION AND DEBRIDEMENT EXTREMITY;  Surgeon: Tennis Must, MD;  Location: [facility];  Service: Orthopedics;;  Incision and drainage of open Proximal interphalangeal joint with closed reduction of Proximal interphalangeal  joint left long finger  . KNEE ARTHROSCOPY      Social History   Social History  . Marital status: Married    Spouse name: N/A  . Number of children: N/A  . Years of education: N/A   Social History Main Topics  . Smoking status: Former Smoker    Packs/day: 0.50    Types: Cigarettes    Quit date: 11/25/2012  . Smokeless tobacco: Never Used  . Alcohol use 0.0 oz/week     Comment: daily  . Drug use: No  . Sexual activity: Not Asked   Other Topics Concern  . None   Social History Narrative  . None     FAMILY HISTORY:  We obtained a detailed, 4-generation family history.  Significant diagnoses are listed below: Family History  Problem Relation Age of Onset  . Hypertension Mother   . Breast cancer Mother 86  . Hyperlipidemia Father   . Diabetes Father   . Pancreatic cancer Father 57  . Ovarian cancer Maternal Aunt     dx in  her 51s  . Parkinson's disease Maternal Uncle   . Stroke Maternal Uncle   . Breast cancer Maternal Grandmother 100  . Breast cancer Maternal Aunt     dx under 46  . Breast cancer Maternal Aunt     dx over 34s  . Breast cancer Maternal Aunt     dx >50  . Breast cancer Other     The Hancock has one son who is healthy and cancer free.  He has two daughters.  She has two sisters, one who had genetic testing that was negative.  The Hancock's father died of pancreatic cancer at age 64.  He had multiple siblings, some who may have had cancer, but she does not remember.  Her paternal grandparents are both deceased. Her grandmother died in her 42's for an unknown reason and her grandfather died of old age.    The Hancock's mother is alive  and well.  She was diagnosed with breast cancer at 14 and had a mastectomy.  She has never had genetic testing.  The Hancock's mother had five sisters and one brother.  One sister had ovarian cancer, and three sisters had breast cancer, one under the age of 53.  Her brother is alive with parkinson's disease.  The Hancock's grandmother developed breast cancer at 70, and her sister developed breast cancer at an unknown age.  Hancock's maternal ancestors are of [place] descent, and paternal ancestors are of [place] descent. There is no reported Ashkenazi Jewish ancestry. There is no known consanguinity.  GENETIC COUNSELING ASSESSMENT: Taylor Hancock is a 55 y.o. female with a family history of breast, ovarian and pancreatic cancer which is somewhat suggestive of a hereditary cancer syndrome and predisposition to cancer. We, therefore, discussed and recommended the following at today's visit.   DISCUSSION: We discussed that about 5-10% of breast cancer is hereditary with most cases due to BRCA mutations.  There are other genes that increase the risk for hereditary breast cancer, including PALB2, ATM and CHEK2.  We reviewed the characteristics, features and inheritance patterns of hereditary cancer syndromes. We also discussed genetic testing, including the appropriate family members to test, the process of testing, insurance coverage and turn-around-time for results. We discussed the implications of a negative, positive and/or variant of uncertain significant result. We recommended Taylor Hancock pursue genetic testing for the Breast/Ovarian cancer gene panel and the Pancreatic cancer gene panel combined into one custom panel.  The Custom gene panel offered by [facility] includes sequencing and rearrangement analysis for the following 21 genes:  ATM, BARD1, BRCA1, BRCA2, BRIP1, CDH1, CHEK2, EPCAM, FANCC, MLH1, MSH2, MSH6, NBN, PALB2, PMS2, PTEN, RAD51C, RAD51D, TP53, VHL, and XRCC2.     Based on Taylor Hancock family history of  cancer, she meets medical criteria for genetic testing. Despite that she meets criteria, she may still have an out of pocket cost. We discussed that if her out of pocket cost for testing is over $100, the laboratory will call and confirm whether she wants to proceed with testing.  If the out of pocket cost of testing is less than $100 she will be billed by the genetic testing laboratory.   In order to estimate her chance of having a BRCA mutation, we used statistical models ([employer] and [employer]) and laboratory data that take into account her personal medical history, family history and ancestry.  Because each model is different, there can be a lot of variability in the risks they give.  Therefore, these  numbers must be considered a rough range and not a precise risk of having a BRCA mutation.  These models estimate that she has approximately a 1.32-9% chance of having a mutation. Based on this assessment of her family and personal history, genetic testing is recommended.  Based on the Hancock's personal and family history, statistical models (Tyrer Cusik)  and literature data were used to estimate her risk of developing breast cancer. These estimate her lifetime risk of developing breast cancer to be approximately 15.8%. This estimation does not take into account any genetic testing results.  The Hancock's lifetime breast cancer risk is a preliminary estimate based on available information using one of several models endorsed by the [place] (ACS). The ACS recommends consideration of breast MRI screening as an adjunct to mammography for patients at high risk (defined as 20% or greater lifetime risk). A more detailed breast cancer risk assessment can be considered, if clinically indicated.   PLAN: After considering the risks, benefits, and limitations, Taylor Hancock  provided informed consent to pursue genetic testing and the blood sample was sent to [facility] for analysis of the Custom  panel. Results should be available within approximately 2-3 weeks' time, at which point they will be disclosed by telephone to Taylor Hancock, as will any additional recommendations warranted by these results. Taylor Hancock will receive a summary of her genetic counseling visit and a copy of her results once available. This information will also be available in Epic. We encouraged Taylor Hancock to remain in contact with cancer genetics annually so that we can continuously update the family history and inform her of any changes in cancer genetics and testing that may be of benefit for her family. Taylor Hancock questions were answered to her satisfaction today. Our contact information was provided should additional questions or concerns arise.  Lastly, we encouraged Taylor Hancock to remain in contact with cancer genetics annually so that we can continuously update the family history and inform her of any changes in cancer genetics and testing that may be of benefit for this family.   Ms.  Hancock questions were answered to her satisfaction today. Our contact information was provided should additional questions or concerns arise. Thank you for the referral and allowing [place] to share in the care of your Hancock.   Karen P. Florene Glen, [place], [facility] Certified Genetic Counselor [email or web address] phone: [phone]  The Hancock was seen for a total of 60 minutes in face-to-face genetic counseling.  This Hancock was discussed with Drs. Magrinat, Lindi Adie and/or Burr Medico who agrees with the above.    _______________________________________________________________________ For Office Staff:  Number of people involved in session: 1 Was an Intern/ student involved with case: no

## 2015-12-17 ENCOUNTER — Telehealth: Payer: Self-pay | Admitting: Genetic Counselor

## 2015-12-17 ENCOUNTER — Encounter: Payer: Self-pay | Admitting: Genetic Counselor

## 2015-12-17 DIAGNOSIS — Z1379 Encounter for other screening for genetic and chromosomal anomalies: Secondary | ICD-10-CM | POA: Insufficient documentation

## 2015-12-17 NOTE — Telephone Encounter (Signed)
LM on VM with good news.  ASked that she CB. 

## 2015-12-18 ENCOUNTER — Encounter: Payer: Self-pay | Admitting: Genetic Counselor

## 2015-12-18 ENCOUNTER — Ambulatory Visit: Payer: Self-pay | Admitting: Genetic Counselor

## 2015-12-18 DIAGNOSIS — Z803 Family history of malignant neoplasm of breast: Secondary | ICD-10-CM

## 2015-12-18 DIAGNOSIS — Z8041 Family history of malignant neoplasm of ovary: Secondary | ICD-10-CM

## 2015-12-18 DIAGNOSIS — Z800 Family history of malignant neoplasm of digestive organs: Secondary | ICD-10-CM

## 2015-12-18 DIAGNOSIS — Z1379 Encounter for other screening for genetic and chromosomal anomalies: Secondary | ICD-10-CM

## 2015-12-18 NOTE — Telephone Encounter (Signed)
Revealed negative genetic testing on a custom panel that looked at the breast/ovarian cancer genes as well as the pancreatic cancer genes. Discussed that we do not know why there is cancer in hte family but it does not appear that she is at increased risk based on a hereditary change in the genes we looked at. She would like a copy of the test results.  We will send a copy.

## 2015-12-18 NOTE — Progress Notes (Signed)
HPI: Taylor Hancock was previously seen in the [facility] clinic due to a family history of cancer and concerns regarding a hereditary predisposition to cancer. Please refer to our prior cancer genetics clinic note for more information regarding Taylor Hancock medical, social and family histories, and our assessment and recommendations, at the time. Taylor Hancock recent genetic test results were disclosed to her, as were recommendations warranted by these results. These results and recommendations are discussed in more detail below.  FAMILY HISTORY:  We obtained a detailed, 4-generation family history.  Significant diagnoses are listed below: Family History  Problem Relation Age of Onset  . Hypertension Mother   . Breast cancer Mother 85  . Hyperlipidemia Father   . Diabetes Father   . Pancreatic cancer Father 30  . Ovarian cancer Maternal Aunt     dx in her 65s  . Parkinson's disease Maternal Uncle   . Stroke Maternal Uncle   . Breast cancer Maternal Grandmother 100  . Breast cancer Maternal Aunt     dx under 87  . Breast cancer Maternal Aunt     dx over 54s  . Breast cancer Maternal Aunt     dx >50  . Breast cancer Other     The patient has one son who is healthy and cancer free.  He has two daughters.  She has two sisters, one who had genetic testing that was negative.  The patient's father died of pancreatic cancer at age 56.  He had multiple siblings, some who may have had cancer, but she does not remember.  Her paternal grandparents are both deceased. Her grandmother died in her 51's for an unknown reason and her grandfather died of old age.    The patient's mother is alive and well.  She was diagnosed with breast cancer at 31 and had a mastectomy.  She has never had genetic testing.  The patient's mother had five sisters and one brother.  One sister had ovarian cancer, and three sisters had breast cancer, one under the age of 4.  Her brother is alive with parkinson's disease.   The patient's grandmother developed breast cancer at 75, and her sister developed breast cancer at an unknown age.  Patient's maternal ancestors are of [place] descent, and paternal ancestors are of [place] descent. There is no reported Ashkenazi Jewish ancestry. There is no known consanguinity.  GENETIC TEST RESULTS: Genetic testing reported out on December 15, 2015 through the custom cancer panel found no deleterious mutations.  The Custom gene panel offered by [facility] includes sequencing and rearrangement analysis for the following 25 genes:  APC, ATM, BARD1, BRCA1, BRCA2, BRIP1, CDH1, CDK4, CDKN2A, CHEK2, EPCAM, FANCC, MLH1, MSH2, MSH6, NBN, PALB2, PMS2, PTEN, RAD51C, RAD51D, STK11, TP53, VHL, and XRCC2.   The test report has been scanned into EPIC and is located under the Molecular Pathology section of the Results Review tab.   We discussed with Taylor Hancock that since the current genetic testing is not perfect, it is possible there may be a gene mutation in one of these genes that current testing cannot detect, but that chance is small. We also discussed, that it is possible that another gene that has not yet been discovered, or that we have not yet tested, is responsible for the cancer diagnoses in the family, and it is, therefore, important to remain in touch with cancer genetics in the future so that we can continue to offer Taylor Hancock the most up to date genetic  testing.   CANCER SCREENING RECOMMENDATIONS:  Given Taylor Hancock personal and family histories, we must interpret these negative results with some caution.  Families with features suggestive of hereditary risk for cancer tend to have multiple family members with cancer, diagnoses in multiple generations and diagnoses before the age of 42. Taylor Hancock family exhibits some of these features. Thus this result may simply reflect our current inability to detect all mutations within these genes or there may be a different gene that has not yet been  discovered or tested.   RECOMMENDATIONS FOR FAMILY MEMBERS: Women in this family might be at some increased risk of developing cancer, over the general population risk, simply due to the family history of cancer. We recommended women in this family have a yearly mammogram beginning at age 44, or 72 years younger than the earliest onset of cancer, an annual clinical breast exam, and perform monthly breast self-exams. Women in this family should also have a gynecological exam as recommended by their primary provider. All family members should have a colonoscopy by age 23.  FOLLOW-UP: Lastly, we discussed with Taylor Hancock that cancer genetics is a rapidly advancing field and it is possible that new genetic tests will be appropriate for her and/or her family members in the future. We encouraged her to remain in contact with cancer genetics on an annual basis so we can update her personal and family histories and let her know of advances in cancer genetics that may benefit this family.   Our contact number was provided. Taylor Hancock questions were answered to her satisfaction, and she knows she is welcome to call us at anytime with additional questions or concerns.   Taylor Kayser, MS, [facility] Certified Genetic Counselor [email or web address]

## 2015-12-23 ENCOUNTER — Telehealth: Payer: Self-pay | Admitting: Neurology

## 2015-12-23 ENCOUNTER — Other Ambulatory Visit: Payer: Self-pay | Admitting: *Deleted

## 2015-12-23 MED ORDER — CLONAZEPAM 1 MG PO TABS: ORAL_TABLET | ORAL | 1 refills | Status: DC

## 2015-12-23 NOTE — Telephone Encounter (Signed)
Rx printed, signed, faxed and confirmed to [facility] on [address] at [phone].

## 2015-12-23 NOTE — Telephone Encounter (Signed)
Patient called requesting to speak with Faith RN regarding a refill on her Klonopin. Please call and advise.

## 2016-01-23 ENCOUNTER — Other Ambulatory Visit: Payer: Self-pay | Admitting: Family Medicine

## 2016-01-23 DIAGNOSIS — R911 Solitary pulmonary nodule: Secondary | ICD-10-CM

## 2016-01-31 ENCOUNTER — Ambulatory Visit
Admission: RE | Admit: 2016-01-31 | Discharge: 2016-01-31 | Disposition: A | Discharge status: Home / Self Care | Payer: 59 | Source: Ambulatory Visit | Attending: Family Medicine | Admitting: Family Medicine

## 2016-01-31 DIAGNOSIS — R911 Solitary pulmonary nodule: Secondary | ICD-10-CM | POA: Diagnosis not present

## 2016-02-12 ENCOUNTER — Encounter: Payer: Self-pay | Admitting: Neurology

## 2016-02-12 ENCOUNTER — Ambulatory Visit (INDEPENDENT_AMBULATORY_CARE_PROVIDER_SITE_OTHER): Payer: 59 | Admitting: Neurology

## 2016-02-12 VITALS — BP 149/97 | HR 92 | Ht 64.0 in | Wt 162.0 lb

## 2016-02-12 DIAGNOSIS — R5383 Other fatigue: Secondary | ICD-10-CM

## 2016-02-12 DIAGNOSIS — F329 Major depressive disorder, single episode, unspecified: Secondary | ICD-10-CM

## 2016-02-12 DIAGNOSIS — G4700 Insomnia, unspecified: Secondary | ICD-10-CM

## 2016-02-12 DIAGNOSIS — G801 Spastic diplegic cerebral palsy: Secondary | ICD-10-CM

## 2016-02-12 DIAGNOSIS — M21372 Foot drop, left foot: Secondary | ICD-10-CM | POA: Diagnosis not present

## 2016-02-12 DIAGNOSIS — F32A Depression, unspecified: Secondary | ICD-10-CM

## 2016-02-12 DIAGNOSIS — G35 Multiple sclerosis: Secondary | ICD-10-CM

## 2016-02-12 DIAGNOSIS — R261 Paralytic gait: Secondary | ICD-10-CM

## 2016-02-12 DIAGNOSIS — F09 Unspecified mental disorder due to known physiological condition: Secondary | ICD-10-CM

## 2016-02-12 DIAGNOSIS — G35B0 Primary progressive multiple sclerosis, unspecified: Secondary | ICD-10-CM

## 2016-02-12 DIAGNOSIS — R200 Anesthesia of skin: Secondary | ICD-10-CM

## 2016-02-12 MED ORDER — ESCITALOPRAM OXALATE 10 MG PO TABS: 10.0000 mg | ORAL_TABLET | Freq: Every day | ORAL | 11 refills | Status: DC

## 2016-02-12 MED ORDER — GABAPENTIN 300 MG PO CAPS: ORAL_CAPSULE | ORAL | 11 refills | Status: DC

## 2016-02-12 NOTE — Progress Notes (Signed)
[facility]  PATIENT: Taylor Hancock DOB: 08-27-1960  REFERRING CLINICIAN: Shirline Frees   HISTORY FROM: Patient REASON FOR VISIT: PPMS   HISTORICAL  CHIEF COMPLAINT:  Chief Complaint  Patient presents with  . Multiple Sclerosis    Reports taking clonazepam 4mg , cyclobenzaprine 10mg  and tizanidine 8mg  each night at bedtime.  Says she is still waking up every night around 3am.  She also has the following concerns: worsening gait, difficulty using hands, fatigue.  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      [facility]  PATIENT: Taylor Hancock DOB: [birth year]  REFERRING CLINICIAN: Shirline Frees   HISTORY FROM: Patient REASON FOR VISIT: PPMS   HISTORICAL  CHIEF COMPLAINT:  Chief Complaint  Patient presents with  . Multiple Sclerosis    Reports taking clonazepam 4mg , cyclobenzaprine 10mg  and tizanidine 8mg  each night at bedtime.  Says she is still waking up every night around 3am.  She also has the following concerns: worsening gait, difficulty using hands, fatigue.  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     HISTORY OF PRESENT ILLNESS:  Taylor Hancock is a 56 year old woman with MS diagnosed in 2005.   She feels she is doing worse.  Her course has been predominantly primary progressive with worsening spasticity and weakness, left worse than right, causing a poor gait.     Gait/strength:    She has worsening left > right leg weakness and gai.   She uses a Rollator walker but has more trouble and needs to push down more with arms to stand up as she uses it.   She also has spasticity, left > right.      She falls about once a month.   She is not able to stand as long as she used to and starts slouching over after a few minutes. She has a left Walk-Aide for foot drop.    Left leg is more spastic and spasticity I often  painful.  Clonazepam at bedtime helps sleep and spasticity some and she now takes 4 mg at night.     Tizanidine also helped..  She can't tolerate these medications during the day.  She had difficulty tolerating baclofen in the past.   Due to risk of side effects, she did not want to try Ampyra in the past. Her left foot drop does better with a Walk-Aide Neurostimulator on the left.      Sensory:  She still has more right > left trunk dysesthetic sensations and she has constant numbness in her hands.  She sometimes drops items out of her hands.   Both of her feet feel numb but not painful  Vision:  She denies major difficulties with her vision.  She has reduced depth perception compared to the past. She does not feel safe driving at night anymore.  Bowel/Bladder:    She reports urinary frequency and urgency with rare incontinence when she can't get to the bathroom in time. She is not sure that she completely empties   She notes bowel incontinence at times.    Fatigue/sleep;  She has fatigue, worse as day goes on and with heat.   The fatigue is both mental and physical.     She was sleeping well most nights but now she is waking up most nights around 3 am and has trouble falling back asleep.    She is  snoring some but husband has not noted any pauses or gasping.   She falls asleep easily.     Mood:    Mood is worse with more depression. She is noting more apathy and now is sleeping poorly.   She is not having tearfulness.    She stopped escitalopram (was on for low dose for hot flashes and is now not on any antidepressant.    She has stress with work and financial issues.    She has greatly reduced her drinking to just one or two on a weekend.    Cognitive:   She feels cognition is a little worse and she is more forgetful, especially in the afternoon.. She grasps things more slowly.   She has difficulties with verbal fluency. She has some issues with in executive function and has more difficulty with  planning and doing complicated tasks.   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    Other:   She has had edema in legs worse when she is less active.    MS History:  She had a [place] sign in the 1990's a few times and she felt her neck was weaker at that time.  However, gait was not affected at that time.  She was diagnosed with multiple sclerosis in 2005 after presenting with several years of worsening gait. Initially, she was diagnosed with relapsing remitting MS and was placed on Betaseron. She had difficulty tolerating Betaseron but did continue to take it. Her first MRI after the diagnosis was unchanged from her previous one. A couple years later she transferred her care to me. After review of her time course of disease, it was apparent that she had a progressive MS and not a relapsing form of MS. Therefore, the Betaseron was discontinued.    REVIEW OF SYSTEMS:  Constitutional: No fevers, chills, sweats, or change in appetite.  Notes fatigue.   Has insomnia and sleepiness Eyes: No visual changes, double vision.  Notes light sensitivity and eye pain Ear, nose and throat: No hearing loss, ear pain, nasal congestion, sore throat.    Cardiovascular: No chest pain, palpitations Respiratory:  No shortness of breath at rest or with exertion.   No wheezes GastrointestinaI: No nausea, vomiting, diarrhea, abdominal pain.  Occ fecal incontinence Genitourinary:  as above.    Musculoskeletal:  No neck pain, back pain.   Has myalgias Integumentary: No rash, pruritus, skin lesions.   Edema in feet Neurological: as above Psychiatric: Increased depression and anxiety  Endocrine: No palpitations, diaphoresis, change in appetite, change in weigh or increased thirst Hematologic/Lymphatic:  No anemia, purpura, petechiae. Allergic/Immunologic: No itchy/runny eyes, nasal congestion, recent allergic reactions, rashes  ALLERGIES: Allergies  Allergen Reactions  . Levaquin [Levofloxacin In  D5w]   . Ppa-Mma Express [Alitraq] Nausea And Vomiting    Blood in vomit and back felt like it was breaking.   Marland Kitchen Zithromax [Azithromycin] Other (See Comments)    abd cramping  . Ultram [Tramadol Hcl] Anxiety    HOME MEDICATIONS: Outpatient Medications Prior to Visit  Medication Sig Dispense Refill  . clonazePAM (KLONOPIN) 1 MG tablet Take 4 tablets at bedtime. 360 tablet 1  . cyclobenzaprine (FLEXERIL) 5 MG tablet TAKE 1 TO 2 TABLETS AT BEDTIME 60 tablet 11  . tiZANidine (ZANAFLEX) 4 MG tablet Take 2 tablets (8 mg total) by mouth at bedtime. 180 tablet 3   No facility-administered medications prior to visit.     PAST MEDICAL  HISTORY: Past Medical History:  Diagnosis Date  . Concussion   . Family history of breast cancer   . Family history of ovarian cancer   . Family history of pancreatic cancer   . Movement disorder   . MS (multiple sclerosis) ([place])   . Multiple sclerosis ([place])   . Neuropathy ([place])   . Vision abnormalities     PAST SURGICAL HISTORY: Past Surgical History:  Procedure Laterality Date  . FRACTURE SURGERY    . I&D EXTREMITY  05/30/2011   Procedure: IRRIGATION AND DEBRIDEMENT EXTREMITY;  Surgeon: Tennis Must, MD;  Location: [facility];  Service: Orthopedics;;  Incision and drainage of open Proximal interphalangeal joint with closed reduction of Proximal interphalangeal  joint left long finger  . KNEE ARTHROSCOPY      FAMILY HISTORY: Family History  Problem Relation Age of Onset  . Hypertension Mother   . Breast cancer Mother 31  . Hyperlipidemia Father   . Diabetes Father   . Pancreatic cancer Father 61  . Ovarian cancer Maternal Aunt     dx in her 45s  . Parkinson's disease Maternal Uncle   . Stroke Maternal Uncle   . Breast cancer Maternal Grandmother 100  . Breast cancer Maternal Aunt     dx under 58  . Breast cancer Maternal Aunt     dx over 91s  . Breast cancer Maternal Aunt     dx >50  . Breast cancer Other     SOCIAL HISTORY:  Social  History   Social History  . Marital status: Married    Spouse name: N/A  . Number of children: N/A  . Years of education: N/A   Occupational History  . Not on file.   Social History Main Topics  . Smoking status: Former Smoker    Packs/day: 0.50    Types: Cigarettes    Quit date: 11/25/2012  . Smokeless tobacco: Never Used  . Alcohol use 0.0 oz/week     Comment: daily  . Drug use: No  . Sexual activity: Not on file   Other Topics Concern  . Not on file   Social History Narrative  . No narrative on file     PHYSICAL EXAM  Vitals:   02/12/16 1520  BP: (!) 149/97  Pulse: 92  Weight: 162 lb (73.5 kg)  Height: 5\' 4"  (1.626 m)    Body mass index is 27.81 kg/m.   General: The patient is well-developed and well-nourished and in mild distress.   Neurologic Exam  Mental status: The patient is alert and oriented x 3 at the time of the examination. The patient has apparent normal recent and remote memory, with an apparently normal attention span and concentration ability.   Speech is normal.  Cranial nerves: Extraocular movements are full.    There is good facial sensation to soft touch bilaterally.Facial strength is normal.      Motor:  Muscle bulk is normal. She has mild right and moderate left increased tone. Strength is 4-/5 in the left leg hip flexor and 4/5 in the right leg. Strength is 4/5 in the lower left leg; 5/5 lower right leg.  Sensory: Sensory testing is intact to touch and vibration sensation on all 4 extremities..   Coordination: Cerebellar testing reveals good finger-nose-finger and poor heel-to-shin (left worse than right)   Gait and station: Station is stable and gait is spastic on the left with wide stance and reduced stride   Can take a few steps  without support only.   She cannot do a tandem walk.    Reflexes: Deep tendon reflexes are symmetric and normal in the arms but she has increased reflexes in both legs with nonsustained clonus at the left  ankle.     DIAGNOSTIC DATA (LABS, IMAGING, TESTING) - I reviewed patient records, labs, notes, testing and imaging myself where available.     ASSESSMENT AND PLAN   Multiple sclerosis, primary progressive ([place]) - Plan: Neuropsychological testing  Spastic diplegia (HCC)  Spastic gait  Foot drop, left  Numbness  Other fatigue  Insomnia, unspecified type  Cognitive dysfunction - Plan: Neuropsychological testing  Depression, unspecified depression type    1.   Continue night time medications and add gabapentin to help with sleep maintenance. 2.   Lexapro for mood 3.   We discussed the advantages and disadvantages of ocrelizumab based on the phase 3 studies.    It is FDA approved for primary progressive MS.   She is concerned about the possible risk of breast cancer (strong FH or Breast cancer) but she would reconsider ocrelizumab next year once more people are on it. 4.   Referral to neuropsychology for neurocognitive testing. I am concerned that the combination of physical impairments, cognitive impairments and fatigue are at the point that she is no longer able to work.  She will return to see me in 4 months, sooner if she has new or worsening neurologic symptoms.      40 minute face to face evaluation with > 1/2 the time counseling or coordinating care about her MS and symptoms  Orenthal Debski A. Felecia Shelling, MD, PhD [id number], [id number] PM Certified in Neurology, Clinical Neurophysiology, Sleep Medicine, Pain Medicine and Neuroimaging  [facility] [address] [place], [place] [address] [phone]'

## 2016-02-13 ENCOUNTER — Telehealth: Payer: Self-pay | Admitting: Neurology

## 2016-02-13 NOTE — Telephone Encounter (Signed)
Sent to Dr. Vikki Ports referral.      Neuropsychological testing (Order [id number])  Neurology  Date: 02/12/2016 Department: [facility] Ordering/Authorizing: Britt Bottom, MD

## 2016-03-02 ENCOUNTER — Encounter (HOSPITAL_COMMUNITY): Payer: Self-pay

## 2016-04-15 DIAGNOSIS — R1084 Generalized abdominal pain: Secondary | ICD-10-CM | POA: Diagnosis not present

## 2016-04-15 DIAGNOSIS — R5383 Other fatigue: Secondary | ICD-10-CM | POA: Diagnosis not present

## 2016-04-15 DIAGNOSIS — J209 Acute bronchitis, unspecified: Secondary | ICD-10-CM | POA: Diagnosis not present

## 2016-04-30 ENCOUNTER — Other Ambulatory Visit: Payer: Self-pay | Admitting: Family Medicine

## 2016-04-30 DIAGNOSIS — R1084 Generalized abdominal pain: Secondary | ICD-10-CM

## 2016-05-06 ENCOUNTER — Ambulatory Visit
Admission: RE | Admit: 2016-05-06 | Discharge: 2016-05-06 | Disposition: A | Discharge status: Home / Self Care | Payer: 59 | Source: Ambulatory Visit | Attending: Family Medicine | Admitting: Family Medicine

## 2016-05-06 DIAGNOSIS — R1013 Epigastric pain: Secondary | ICD-10-CM | POA: Diagnosis not present

## 2016-05-06 DIAGNOSIS — R1084 Generalized abdominal pain: Secondary | ICD-10-CM

## 2016-05-11 DIAGNOSIS — R946 Abnormal results of thyroid function studies: Secondary | ICD-10-CM | POA: Diagnosis not present

## 2016-05-11 DIAGNOSIS — G35 Multiple sclerosis: Secondary | ICD-10-CM | POA: Diagnosis not present

## 2016-05-11 DIAGNOSIS — R5383 Other fatigue: Secondary | ICD-10-CM | POA: Diagnosis not present

## 2016-05-12 DIAGNOSIS — L718 Other rosacea: Secondary | ICD-10-CM | POA: Diagnosis not present

## 2016-05-25 ENCOUNTER — Other Ambulatory Visit: Payer: Self-pay | Admitting: Neurology

## 2016-05-25 DIAGNOSIS — M810 Age-related osteoporosis without current pathological fracture: Secondary | ICD-10-CM | POA: Diagnosis not present

## 2016-05-25 DIAGNOSIS — M8589 Other specified disorders of bone density and structure, multiple sites: Secondary | ICD-10-CM | POA: Diagnosis not present

## 2016-06-09 ENCOUNTER — Encounter (INDEPENDENT_AMBULATORY_CARE_PROVIDER_SITE_OTHER): Payer: Self-pay

## 2016-06-09 ENCOUNTER — Ambulatory Visit (INDEPENDENT_AMBULATORY_CARE_PROVIDER_SITE_OTHER): Payer: 59 | Admitting: Neurology

## 2016-06-09 ENCOUNTER — Encounter: Payer: Self-pay | Admitting: Neurology

## 2016-06-09 ENCOUNTER — Other Ambulatory Visit: Payer: Self-pay | Admitting: Neurology

## 2016-06-09 VITALS — BP 139/88 | HR 88 | Resp 18 | Ht 64.0 in | Wt 157.2 lb

## 2016-06-09 DIAGNOSIS — R5383 Other fatigue: Secondary | ICD-10-CM

## 2016-06-09 DIAGNOSIS — R296 Repeated falls: Secondary | ICD-10-CM | POA: Diagnosis not present

## 2016-06-09 DIAGNOSIS — F09 Unspecified mental disorder due to known physiological condition: Secondary | ICD-10-CM

## 2016-06-09 DIAGNOSIS — G4700 Insomnia, unspecified: Secondary | ICD-10-CM

## 2016-06-09 DIAGNOSIS — G35 Multiple sclerosis: Secondary | ICD-10-CM

## 2016-06-09 DIAGNOSIS — R200 Anesthesia of skin: Secondary | ICD-10-CM

## 2016-06-09 DIAGNOSIS — R261 Paralytic gait: Secondary | ICD-10-CM

## 2016-06-09 DIAGNOSIS — G801 Spastic diplegic cerebral palsy: Secondary | ICD-10-CM

## 2016-06-09 DIAGNOSIS — M21372 Foot drop, left foot: Secondary | ICD-10-CM

## 2016-06-09 DIAGNOSIS — R350 Frequency of micturition: Secondary | ICD-10-CM

## 2016-06-09 MED ORDER — CLONAZEPAM 1 MG PO TABS: ORAL_TABLET | ORAL | 1 refills | Status: DC

## 2016-06-09 NOTE — Progress Notes (Signed)
[facility]  PATIENT: Taylor Hancock DOB: [birth year]  REFERRING CLINICIAN: Shirline Frees   HISTORY FROM: Patient REASON FOR VISIT: PPMS   HISTORICAL  CHIEF COMPLAINT:  Chief Complaint  Patient presents with  . Multiple Sclerosis    She remains off of dmt.  Has recently been dx. with osteoporosis. Is also seeing endocrinology for 20lb. wt. gain over 3 mos./fim  . Sleep Disturbance  . Trigeminal Neuralgia     [facility]  PATIENT: Taylor Hancock DOB: 10-06-1960  REFERRING CLINICIAN: Shirline Frees   HISTORY FROM: Patient REASON FOR VISIT: PPMS   HISTORICAL  CHIEF COMPLAINT:  Chief Complaint  Patient presents with  . Multiple Sclerosis    She remains off of dmt.  Has recently been dx. with osteoporosis. Is also seeing endocrinology for 20lb. wt. gain over 3 mos./fim  . Sleep Disturbance  . Trigeminal Neuralgia    HISTORY OF PRESENT ILLNESS:  Taylor Hancock is a 56 year old woman with MS diagnosed in 2005.     MS:.  Her course has been predominantly primary progressive.  She has had slow worsening spasticity and weakness, in the left more than the right, especially in the legs. This has resulted in a worsening gait.     Gait/strength:    She reports that her gait is slowly worsening and she has occasional falls (once a month)    She has left  > right leg weakness.     She uses a Rollator walker now almost exclusively (used to 'free walk' more)   She also has spasticity, left > right.       Left leg is more spastic and spasticity I often painful.  Clonazepam at bedtime helps sleep and spasticity some and she now takes 4 mg at night.     Tizanidine also helped..  Baclofen and daytime muscle relaxants have been poorly tolerated.  Due to risk of side effects, she did not want to try Ampyra in the past. Her left foot drop does better with a Walk-Aide Neurostimulator on the left.      Sensory:  She gets right > left trunk dysesthesia.   This is stable.   Both of her feet feel numb but not painful  Vision:  She has not had optic neuritis.    he has reduced depth perception compared to the past. She does not feel safe driving at night anymore.  Bowel/Bladder:    She has urinary frequency and urgency.     Rarely she has urge incontinence.   Gets rare UTI.   She notes bowel incontinence at times.    Fatigue/sleep;  She has fatigue, worse as day goes on and with heat.   The fatigue is both mental and physical.      She falls asleep easily but may wake up some at night.   She sometimes naps during the day (and if so, sleep is worse at night)  Mood:    She feels mood is about the same as last visit.    She notes family and job stress.   Job stress has bene better the past few months.   She has had a couple crying spells but is more likely to be irritable.   She has stress with work and financial issues.    She has greatly reduced her drinking to just one or two on a weekend.    Cognitive:   She feels cognition is stable.    She notes that  she is often forgetful.   This seems worse in the afternoons.    She also has difficulties with verbal fluency. She has some issues with in executive function and has more difficulty with planning and doing complicated tasks.   She is now able to work 2 days a week from the house and htt has helped her some.   Other:   She was having a lot of marital and job stress issues last year and felt that that contributed 20 pound weight gain.   She has been referred to endocrinology for possible endocrine disorder.   Her PCP investigated thyroid and adrenal labs but they were reportedly fine.   U/S was fine.  .   MS History:  She had a [place] sign in the 1990's a few times and she felt her neck was weaker at that time.  However, gait was not affected at that time.  She was diagnosed with multiple sclerosis in 2005 after presenting with several years of worsening gait. Initially, she was diagnosed with relapsing  remitting MS and was placed on Betaseron. She had difficulty tolerating Betaseron but did continue to take it. Her first MRI after the diagnosis was unchanged from her previous one. A couple years later she transferred her care to me. After review of her time course of disease, it was apparent that she had a progressive MS and not a relapsing form of MS. Therefore, the Betaseron was discontinued.    REVIEW OF SYSTEMS:  Constitutional: No fevers, chills, sweats, or change in appetite.  Notes fatigue.   Has insomnia and sleepiness Eyes: No visual changes, double vision.  Notes light sensitivity and eye pain Ear, nose and throat: No hearing loss, ear pain, nasal congestion, sore throat.    Cardiovascular: No chest pain, palpitations Respiratory:  No shortness of breath at rest or with exertion.   No wheezes GastrointestinaI: No nausea, vomiting, diarrhea, abdominal pain.  Occ fecal incontinence Genitourinary:  as above.    Musculoskeletal:  No neck pain, back pain.   Has myalgias Integumentary: No rash, pruritus, skin lesions.   Edema in feet Neurological: as above Psychiatric: Increased depression and anxiety  Endocrine: No palpitations, diaphoresis, change in appetite, change in weigh or increased thirst Hematologic/Lymphatic:  No anemia, purpura, petechiae. Allergic/Immunologic: No itchy/runny eyes, nasal congestion, recent allergic reactions, rashes  ALLERGIES: Allergies  Allergen Reactions  . Levaquin [Levofloxacin In D5w]   . Ppa-Mma Express [Alitraq] Nausea And Vomiting    Blood in vomit and back felt like it was breaking.   Marland Kitchen Zithromax [Azithromycin] Other (See Comments)    abd cramping  . Ultram [Tramadol Hcl] Anxiety    HOME MEDICATIONS: Outpatient Medications Prior to Visit  Medication Sig Dispense Refill  . clonazePAM (KLONOPIN) 1 MG tablet Take 4 tablets at bedtime. 360 tablet 1  . cyclobenzaprine (FLEXERIL) 5 MG tablet TAKE 1 TO 2 TABLETS AT BEDTIME 60 tablet 11  .  escitalopram (LEXAPRO) 10 MG tablet Take 1 tablet (10 mg total) by mouth daily. 30 tablet 11  . gabapentin (NEURONTIN) 300 MG capsule Take one or two at bedtime 60 capsule 11  . tiZANidine (ZANAFLEX) 4 MG tablet Take 2 tablets (8 mg total) by mouth at bedtime. 180 tablet 3   No facility-administered medications prior to visit.     PAST MEDICAL HISTORY: Past Medical History:  Diagnosis Date  . Concussion   . Family history of breast cancer   . Family history of ovarian cancer   .  Family history of pancreatic cancer   . Movement disorder   . MS (multiple sclerosis) ([place])   . Multiple sclerosis ([place])   . Neuropathy   . Vision abnormalities     PAST SURGICAL HISTORY: Past Surgical History:  Procedure Laterality Date  . FRACTURE SURGERY    . I&D EXTREMITY  05/30/2011   Procedure: IRRIGATION AND DEBRIDEMENT EXTREMITY;  Surgeon: Tennis Must, MD;  Location: [facility];  Service: Orthopedics;;  Incision and drainage of open Proximal interphalangeal joint with closed reduction of Proximal interphalangeal  joint left long finger  . KNEE ARTHROSCOPY      FAMILY HISTORY: Family History  Problem Relation Age of Onset  . Hypertension Mother   . Breast cancer Mother 79  . Hyperlipidemia Father   . Diabetes Father   . Pancreatic cancer Father 71  . Ovarian cancer Maternal Aunt        dx in her 12s  . Parkinson's disease Maternal Uncle   . Stroke Maternal Uncle   . Breast cancer Maternal Grandmother 100  . Breast cancer Maternal Aunt        dx under 49  . Breast cancer Maternal Aunt        dx over 66s  . Breast cancer Maternal Aunt        dx >50  . Breast cancer Other     SOCIAL HISTORY:  Social History   Social History  . Marital status: Married    Spouse name: N/A  . Number of children: N/A  . Years of education: N/A   Occupational History  . Not on file.   Social History Main Topics  . Smoking status: Former Smoker    Packs/day: 0.50    Types: Cigarettes    Quit  date: 11/25/2012  . Smokeless tobacco: Never Used  . Alcohol use 0.0 oz/week     Comment: daily  . Drug use: No  . Sexual activity: Not on file   Other Topics Concern  . Not on file   Social History Narrative  . No narrative on file     PHYSICAL EXAM  Vitals:   06/09/16 1523  BP: 139/88  Pulse: 88  Resp: 18  Weight: 157 lb 3.2 oz (71.3 kg)  Height: 5\' 4"  (1.626 m)    Body mass index is 26.98 kg/m.   General: The patient is well-developed and well-nourished and in mild distress.   Neurologic Exam  Mental status: The patient is alert and oriented x 3 at the time of the examination. The patient has apparent normal recent and remote memory, with an apparently normal attention span and concentration ability.   Speech is normal.  Cranial nerves: Extraocular movements are full.    Facial strength and sensation is normal. Palatal elevation. Tongue protrusion is midline. Hearing is symmetrically   Motor:  Muscle bulk is normal. She has mild right and moderate left increased tone. Strength is 4-/5 in the left leg and hip flexor and 4/5 in the right upper leg. Strength is 4/5 in the lower left leg; 5/5 lower right leg.  Sensory: Sensory testing is intact to touch and vibration sensation on all 4 extremities..   Coordination: Cerebellar testing reveals good finger-nose-finger and poor heel-to-shin (left worse than right)   Gait and station: Station is stable and gait is spastic on the left with wide stance and reduced stride   she can take a few steps without support but needs her walker or to hold onto furniture  to take more steps.   She cannot do a tandem walk.    Reflexes: Deep tendon reflexes are symmetric and normal in the arms but she has increased reflexes in both legs with nonsustained clonus at the left ankle.     DIAGNOSTIC DATA (LABS, IMAGING, TESTING) - I reviewed patient records, labs, notes, testing and imaging myself where available.     ASSESSMENT AND  PLAN   Multiple sclerosis, primary progressive (HCC)  Spastic diplegia (HCC)  Foot drop, left  Frequent falls  Insomnia, unspecified type  Numbness  Other fatigue  Spastic gait  Urinary frequency    1.   Continue night time medications and add gabapentin to help with sleep maintenance. 2.   Renew clonazepam.   She stopped Lexapro.   3.   We again discussed the advantages and disadvantages of ocrelizumab based on the phase 3 study for primary progressive MS.   Due to the risk of breast cancer (strong FH or Breast cancer) she had ggenetic testing and was told her breast cancer genes were negative.   4.   Referral to neuropsychology for neurocognitive testing.   Due to the combination of physical impairments, cognitive impairments and fatigue she may not be able to work longer.  5.   Referral to PT for gait.  She will return to see me in 4 months, sooner if she has new or worsening neurologic symptoms.      42 minute face to face evaluation with > 1/2 the time counseling or coordinating care about her MS and symptoms  Eddy Liszewski A. Felecia Shelling, MD, PhD 2/35/3614, 4:31 PM Certified in Neurology, Clinical Neurophysiology, Sleep Medicine, Pain Medicine and Neuroimaging  [facility] [address], [place], [place] [address] [phone]'

## 2016-06-19 ENCOUNTER — Emergency Department (HOSPITAL_BASED_OUTPATIENT_CLINIC_OR_DEPARTMENT_OTHER)
Admission: EM | Admit: 2016-06-19 | Discharge: 2016-06-19 | Disposition: A | Discharge status: Home / Self Care | Payer: 59 | Attending: Emergency Medicine | Admitting: Emergency Medicine

## 2016-06-19 ENCOUNTER — Encounter (HOSPITAL_BASED_OUTPATIENT_CLINIC_OR_DEPARTMENT_OTHER): Payer: Self-pay | Admitting: *Deleted

## 2016-06-19 ENCOUNTER — Emergency Department (HOSPITAL_BASED_OUTPATIENT_CLINIC_OR_DEPARTMENT_OTHER): Payer: 59

## 2016-06-19 DIAGNOSIS — S99922A Unspecified injury of left foot, initial encounter: Secondary | ICD-10-CM | POA: Diagnosis present

## 2016-06-19 DIAGNOSIS — M7989 Other specified soft tissue disorders: Secondary | ICD-10-CM | POA: Diagnosis not present

## 2016-06-19 DIAGNOSIS — S93602A Unspecified sprain of left foot, initial encounter: Secondary | ICD-10-CM | POA: Insufficient documentation

## 2016-06-19 DIAGNOSIS — F17210 Nicotine dependence, cigarettes, uncomplicated: Secondary | ICD-10-CM | POA: Insufficient documentation

## 2016-06-19 DIAGNOSIS — W1830XA Fall on same level, unspecified, initial encounter: Secondary | ICD-10-CM | POA: Diagnosis not present

## 2016-06-19 DIAGNOSIS — Y998 Other external cause status: Secondary | ICD-10-CM | POA: Diagnosis not present

## 2016-06-19 DIAGNOSIS — Y939 Activity, unspecified: Secondary | ICD-10-CM | POA: Insufficient documentation

## 2016-06-19 DIAGNOSIS — Y929 Unspecified place or not applicable: Secondary | ICD-10-CM | POA: Insufficient documentation

## 2016-06-19 NOTE — ED Notes (Signed)
ED Provider at bedside. 

## 2016-06-19 NOTE — ED Provider Notes (Signed)
[facility] DEPT [facility] Provider Note   CSN: [id number] Arrival date & time: 06/19/16  0750     History   Chief Complaint Chief Complaint  Patient presents with  . Fall    HPI Taylor Hancock is a 56 y.o. female.  Patient with history of MS and frequent falls presents with left ankle and foot pain from recent fall. Patient has pain with range of motion. No other injuries except for mild knee contusion.      Past Medical History:  Diagnosis Date  . Concussion   . Family history of breast cancer   . Family history of ovarian cancer   . Family history of pancreatic cancer   . Movement disorder   . MS (multiple sclerosis) ([place])   . Multiple sclerosis ([place])   . Neuropathy   . Vision abnormalities     Patient Active Problem List   Diagnosis Date Noted  . Mild cognitive disorder 06/09/2016  . Genetic testing 12/17/2015  . Family history of breast cancer   . Family history of ovarian cancer   . Family history of pancreatic cancer   . Insomnia 06/10/2015  . Urinary frequency 06/10/2015  . Frequent falls 12/26/2014  . Snoring 12/26/2014  . Depression 08/01/2014  . Other fatigue 08/01/2014  . Multiple sclerosis, primary progressive ([place]) 02/07/2014  . Spastic gait 02/07/2014  . Spastic diplegia ([place]) 02/07/2014  . Numbness 02/07/2014  . Foot drop, left 02/07/2014    Past Surgical History:  Procedure Laterality Date  . FRACTURE SURGERY Left   . I&D EXTREMITY  05/30/2011   Procedure: IRRIGATION AND DEBRIDEMENT EXTREMITY;  Surgeon: Tennis Must, MD;  Location: [facility];  Service: Orthopedics;;  Incision and drainage of open Proximal interphalangeal joint with closed reduction of Proximal interphalangeal  joint left long finger  . KNEE ARTHROSCOPY      OB History    No data available       Home Medications    Prior to Admission medications   Medication Sig Start Date End Date Taking? Authorizing Provider  clonazePAM (KLONOPIN) 1 MG tablet Take 4 tablets at  bedtime. 06/09/16  Yes Sater, Nanine Means, MD  cyclobenzaprine (FLEXERIL) 5 MG tablet TAKE 1 TO 2 TABLETS AT BEDTIME 05/25/16  Yes Sater, Nanine Means, MD  tiZANidine (ZANAFLEX) 4 MG tablet Take 2 tablets (8 mg total) by mouth at bedtime. 08/07/15  Yes Sater, Nanine Means, MD  gabapentin (NEURONTIN) 300 MG capsule Take one or two at bedtime 02/12/16   Sater, Nanine Means, MD    Family History Family History  Problem Relation Age of Onset  . Hypertension Mother   . Breast cancer Mother 57  . Hyperlipidemia Father   . Diabetes Father   . Pancreatic cancer Father 22  . Ovarian cancer Maternal Aunt        dx in her 46s  . Parkinson's disease Maternal Uncle   . Stroke Maternal Uncle   . Breast cancer Maternal Grandmother 100  . Breast cancer Maternal Aunt        dx under 83  . Breast cancer Maternal Aunt        dx over 93s  . Breast cancer Maternal Aunt        dx >50  . Breast cancer Other     Social History Social History  Substance Use Topics  . Smoking status: Former Smoker    Packs/day: 0.50    Types: Cigarettes    Quit date: 11/25/2012  .  Smokeless tobacco: Never Used  . Alcohol use 0.0 oz/week     Comment: 2 daily     Allergies   Levaquin [levofloxacin in d5w]; Ppa-mma express [alitraq]; Zithromax [azithromycin]; and Ultram [tramadol hcl]   Review of Systems Review of Systems  Gastrointestinal: Negative for vomiting.  Musculoskeletal: Positive for gait problem and joint swelling.  Neurological: Positive for weakness.     Physical Exam Updated Vital Signs BP (!) 154/90 (BP Location: Right Arm)   Pulse 90   Temp 98.3 F (36.8 C) (Oral)   Resp 16   Ht 5\' 4"  (1.626 m)   Wt 71.2 kg (157 lb)   SpO2 98%   BMI 26.95 kg/m   Physical Exam  Constitutional: She appears well-developed and well-nourished.  HENT:  Head: Normocephalic.  Pulmonary/Chest: Effort normal.  Musculoskeletal: She exhibits edema and tenderness. She exhibits no deformity.  Patient has tenderness dorsal  midfoot, lateral malleolus and lateral foot. Minimal edema. No deformity. Patient has weakness with dorsiflexion which is chronic from her MS. No open wounds.  Neurological: She is alert.  Skin: Skin is warm.     ED Treatments / Results  Labs (all labs ordered are listed, but only abnormal results are displayed) Labs Reviewed - No data to display  EKG  EKG Interpretation None       Radiology Dg Ankle Complete Left  Result Date: 06/19/2016 CLINICAL DATA:  Fall.  Knee pain. EXAM: LEFT ANKLE COMPLETE - 3+ VIEW COMPARISON:  10/21/2013. FINDINGS: Diffuse soft tissue swelling. No acute bony or joint abnormality identified. Corticated bony density noted along the dorsum of the talus consistent with old fracture fragment. IMPRESSION: 1. Diffuse soft tissue swelling.  No acute bony abnormality. 2. Tiny corticated bony density noted along the dorsum of the talus consistent with old fracture fragment. Electronically Signed   By: Marcello Moores  Register   On: 06/19/2016 09:03   Dg Knee Complete 4 Views Left  Result Date: 06/19/2016 CLINICAL DATA:  Fall.  Pain. EXAM: LEFT KNEE - COMPLETE 4+ VIEW COMPARISON:  CT 08/07/2005. FINDINGS: Mild patellofemoral medial compartment degenerative change. Diffuse osteopenia. No acute abnormality identified. IMPRESSION: Mild degenerative change.  No acute abnormality. Electronically Signed   By: Marcello Moores  Register   On: 06/19/2016 09:12   Dg Foot Complete Left  Result Date: 06/19/2016 CLINICAL DATA:  Fall.  Knee pain. EXAM: LEFT FOOT - COMPLETE 3+ VIEW COMPARISON:  10/03/ 2015. FINDINGS: Diffuse osteopenia and degenerative change. Degenerative changes most prominent about the first metatarsophalangeal joint. Tiny corticated bony density noted along the dorsum of the talus consistent with old fracture. No acute abnormality identified. IMPRESSION: Diffuse osteopenia degenerative change. Old corticated bony density noted along the posterior talus consistent with old fracture  fragment. No acute abnormality. No evidence of acute fracture. Electronically Signed   By: Marcello Moores  Register   On: 06/19/2016 09:10    Procedures Procedures (including critical care time)  Medications Ordered in ED Medications - No data to display   Initial Impression / Assessment and Plan / ED Course  I have reviewed the triage vital signs and the nursing notes.  Pertinent labs & imaging results that were available during my care of the patient were reviewed by me and considered in my medical decision making (see chart for details).    Patient presents with left ankle sprain and foot contusion. X-rays no acute fracture. Patient has outpatient follow-up and discuss supportive care. Discussed possibility of stress fracture with her osteoporosis and use crutches as needed.  Final Clinical Impressions(s) / ED Diagnoses   Final diagnoses:  Foot sprain, left, initial encounter    New Prescriptions New Prescriptions   No medications on file     Elnora Morrison, MD 06/19/16 [phone]

## 2016-06-19 NOTE — ED Notes (Signed)
Pt taken to and from [place] via [facility].

## 2016-06-19 NOTE — ED Triage Notes (Signed)
Pt has MS. Reports 2 falls last night. C/o pain in left foot and ankle. Pt has a "walk-aid" on her left shin to help left the front of her foot

## 2016-06-19 NOTE — ED Notes (Signed)
Patient transported to X-ray 

## 2016-06-19 NOTE — Discharge Instructions (Signed)
Use ice, Motrin, Tylenol as needed for pain. Use crutches as needed and discussed. Follow-up for repeat x-ray if no improvement in 1 week. Weightbearing as tolerated.

## 2016-06-19 NOTE — ED Notes (Signed)
Pt taken to and from [place] via [facility].

## 2016-07-13 ENCOUNTER — Encounter: Payer: Self-pay | Admitting: Endocrinology

## 2016-07-13 ENCOUNTER — Ambulatory Visit (INDEPENDENT_AMBULATORY_CARE_PROVIDER_SITE_OTHER): Payer: 59 | Admitting: Endocrinology

## 2016-07-13 VITALS — BP 138/88 | HR 89 | Ht 64.0 in | Wt 159.0 lb

## 2016-07-13 DIAGNOSIS — R5383 Other fatigue: Secondary | ICD-10-CM

## 2016-07-13 DIAGNOSIS — R946 Abnormal results of thyroid function studies: Secondary | ICD-10-CM | POA: Diagnosis not present

## 2016-07-13 DIAGNOSIS — R635 Abnormal weight gain: Secondary | ICD-10-CM

## 2016-07-13 DIAGNOSIS — L680 Hirsutism: Secondary | ICD-10-CM | POA: Diagnosis not present

## 2016-07-13 LAB — T4, FREE: Free T4: 1.84 ng/dL — ABNORMAL HIGH (ref 0.60–1.60)

## 2016-07-13 LAB — TSH: TSH: 1.63 u[IU]/mL (ref 0.35–4.50)

## 2016-07-13 LAB — T3, FREE: T3, Free: 9.5 pg/mL — ABNORMAL HIGH (ref 2.3–4.2)

## 2016-07-13 NOTE — Progress Notes (Addendum)
Patient ID: Taylor Hancock, female   DOB: [birth year], 58 y.o.   MRN: [id number]           Referring physician: Shirline Frees  Chief complaint: Weight gain  History of Present Illness:  The patient has had several symptoms going on for several months and not clear what she is referred here for She thinks that since about February she started gaining weight and has gained about 20 pounds overall However she thinks her weight is down 7 pounds from the maximum she had Also around that time she started gaining weight she was having a lot more stress with significant family and personal issues She thinks that she is having a lot of craving for carbohydrates and certain foods Because of her MS she is not able to exercise much and has not been able to go to the gym lately  Patient thinks that about January she felt that her face was swollen for about a month particularly when she would In the mornings but this is not happening now She has had mild hair loss for several months She thinks that she has had a little more facial hair over the last few months also and will sometimes need to shave She may occasionally tend to bruise  She has had some symptoms of feeling excessively warm during the night between about 3-5 AM without excessive sweating Does not have any heat intolerance in general or any cold intolerance either  Labs done by PCP showed total T3 of 189 and free T4 1.57, high, done in April with TSH 2.2 and afternoon cortisol 7.6   Past Medical History:  Diagnosis Date  . Concussion   . Family history of breast cancer   . Family history of ovarian cancer   . Family history of pancreatic cancer   . Movement disorder   . MS (multiple sclerosis) ([place])   . Multiple sclerosis ([place])   . Neuropathy   . Vision abnormalities     Past Surgical History:  Procedure Laterality Date  . FRACTURE SURGERY Left   . I&D EXTREMITY  05/30/2011   Procedure: IRRIGATION AND DEBRIDEMENT EXTREMITY;   Surgeon: Tennis Must, MD;  Location: [facility];  Service: Orthopedics;;  Incision and drainage of open Proximal interphalangeal joint with closed reduction of Proximal interphalangeal  joint left long finger  . KNEE ARTHROSCOPY      Family History  Problem Relation Age of Onset  . Hypertension Mother   . Breast cancer Mother 72  . Hypothyroidism Mother   . Hyperlipidemia Father   . Diabetes Father   . Pancreatic cancer Father 23  . Hypothyroidism Sister   . Ovarian cancer Maternal Aunt        dx in her 36s  . Parkinson's disease Maternal Uncle   . Stroke Maternal Uncle   . Breast cancer Maternal Grandmother 100  . Breast cancer Maternal Aunt        dx under 26  . Breast cancer Maternal Aunt        dx over 19s  . Breast cancer Maternal Aunt        dx >50  . Breast cancer Other   . Hypothyroidism Brother     Social History:  reports that she quit smoking about 3 years ago. Her smoking use included Cigarettes. She smoked 0.50 packs per day. She has never used smokeless tobacco. She reports that she drinks alcohol. She reports that she does not use drugs.  Allergies:  Allergies  Allergen Reactions  . Levaquin [Levofloxacin In D5w]   . Ppa-Mma Express [Alitraq] Nausea And Vomiting    Blood in vomit and back felt like it was breaking.   Marland Kitchen Zithromax [Azithromycin] Other (See Comments)    abd cramping  . Ultram [Tramadol Hcl] Anxiety    Allergies as of 07/13/2016      Reactions   Levaquin [levofloxacin In D5w]    Ppa-mma Express [alitraq] Nausea And Vomiting   Blood in vomit and back felt like it was breaking.    Zithromax [azithromycin] Other (See Comments)   abd cramping   Ultram [tramadol Hcl] Anxiety      Medication List       Accurate as of 07/13/16 11:38 AM. Always use your most recent med list.          clonazePAM 1 MG tablet Commonly known as:  KLONOPIN Take 4 tablets at bedtime.   cyclobenzaprine 5 MG tablet Commonly known as:  FLEXERIL TAKE 1 TO 2  TABLETS AT BEDTIME   tiZANidine 4 MG tablet Commonly known as:  ZANAFLEX Take 2 tablets (8 mg total) by mouth at bedtime.       LABS:  No visits with results within 1 Week(s) from this visit.  Latest known visit with results is:  Office Visit on 06/10/2015  Component Date Value Ref Range Status  . Specific Gravity, UA 06/10/2015 1.025  1.005 - 1.030 Final  . pH, UA 06/10/2015 6.0  5.0 - 7.5 Final  . Color, UA 06/10/2015 Yellow  Yellow Final  . Appearance Ur 06/10/2015 Clear  Clear Final  . Leukocytes, UA 06/10/2015 Negative  Negative Final  . Protein, UA 06/10/2015 Negative  Negative/Trace Final  . Glucose, UA 06/10/2015 Negative  Negative Final  . Ketones, UA 06/10/2015 Negative  Negative Final  . RBC, UA 06/10/2015 Negative  Negative Final  . Bilirubin, UA 06/10/2015 Negative  Negative Final  . Urobilinogen, Ur 06/10/2015 0.2  0.2 - 1.0 mg/dL Final  . Nitrite, UA 06/10/2015 Negative  Negative Final  . Microscopic Examination 06/10/2015 Comment   Final   Microscopic not indicated and not performed.  . Urine Culture, Routine 06/10/2015 Final report   Final  . Organism ID, Bacteria 06/10/2015 Comment   Final   Comment: Mixed urogenital flora 25,000-50,000 colony forming units per mL         Review of Systems  Constitutional: Positive for weight gain.       Has gained about 20 pounds since February  HENT: Negative for headaches.   Cardiovascular: Positive for palpitations.       She may feel palpitations only late at night if she is eating a late dinner  Gastrointestinal: Positive for constipation and diarrhea.  Endocrine: Positive for fatigue. Negative for heat intolerance and polydipsia.       She feels a hot flash lasting about an hour around 3 AM only  Genitourinary: Negative for dysuria.  Skin:       Brittle nails present, she thinks she has had some increased facial hair recently and mild hair loss for several months  Neurological: Positive for weakness. Negative  for tremors.       She has more weakness on her left leg from MS  Psychiatric/Behavioral: Positive for depressed mood and insomnia.       She thinks she has had a lot of stress this year     PHYSICAL EXAM:  BP 138/88   Pulse 89   Ht 5'  4" (1.626 m)   Wt 159 lb (72.1 kg)   SpO2 98%   BMI 27.29 kg/m   GENERAL:   She does not have any moon facies are plethora of the face No significant alopecia She has minimal hirsutism, not apparent today There is no thinning of the skin and no purplish striae She has mild increase in bilateral supraclavicular fat pads and minimal buffalo hump  No pallor, clubbing, lymphadenopathy or edema.  Skin:  no rash or pigmentation.  EYES:  Externally normal.  Fundii:  normal discs and vessels.  ENT: Oral mucosa and tongue normal.  THYROID:  Not palpable.  HEART:  Normal  S1 and S2; no murmur or click.  CHEST:  Normal shape.  Lungs: Vescicular breath sounds heard equally.  No crepitations/ wheeze.  ABDOMEN:  No distention.  Liver and spleen not palpable.  No other mass or tenderness.  NEUROLOGICAL: .Reflexes are bilaterally normal at biceps.  JOINTS:  Normal.   ASSESSMENT:    Abnormal free T4 level with nonspecific symptoms of fatigue, depression and weight gain.  This is likely to be from her taking biotin earlier this year.  She does not have any symptoms or signs of hyperthyroidism to suggest a rare syndrome of hyperthyroidism caused by TSH excess  ?  Cortisol excess and Cushing syndrome.  She has had nonspecific weight gain, depression, mild hirsutism and hair loss.  However she does not have any significant change in facial appearance at the present Also discussed that she does not have any other obvious signs of Cushing's disease at present except for minimal central obesity and nonspecific hair changes.  Also her cortisol level was not inappropriately high when checked by her PCP in the afternoon   PLAN:    Recheck thyroid levels  today  Discussed with the patient that evaluation of her cortisol level with dexamethasone suppression would be the only way to rule out Cushing's disease but this may not be accurate with her continued depression.  She currently does not think she can come early morning for her fasting cortisol level since she has difficulty getting up and moving around in the mornings.    Consultation note sent to the referring physician  [facility] 07/13/2016, 11:38 AM   ADDENDUM: Free T4 and free T3 are high and TSH normal, will check SHBG and may also need to check alpha subunit for evaluation of thyroid hormone resistance versus TSH producing tumor  Updated on 07/19/16: SHBG is normal indicating thyroid hormone resistance and no further evaluation needed

## 2016-07-14 ENCOUNTER — Telehealth: Payer: Self-pay | Admitting: Family Medicine

## 2016-07-14 NOTE — Telephone Encounter (Signed)
Patient says Dr. Dwyane Dee called and told her she needs labs and wants to know options for where she can get them done?  She is a complex patient with MS and needs a call back to discuss.  Thank you,  -LL

## 2016-07-15 ENCOUNTER — Other Ambulatory Visit: Payer: 59

## 2016-07-15 DIAGNOSIS — L680 Hirsutism: Secondary | ICD-10-CM | POA: Diagnosis not present

## 2016-07-16 LAB — TESTOSTERONE, FREE, TOTAL, SHBG
Sex Hormone Binding: 46.9 nmol/L (ref 17.3–125.0)
Testosterone, Free: 2.4 pg/mL (ref 0.0–4.2)
Testosterone: 37 ng/dL (ref 3–41)

## 2016-07-19 NOTE — Progress Notes (Signed)
Please call to let patient know that the lab result indicates that there is no overactivity of the pituitary gland and T4 and T3 thyroid levels are falsely high because the body is trying to overcome a resistance to the hormones at the tissue level, no further evaluation is needed

## 2016-07-20 ENCOUNTER — Telehealth: Payer: Self-pay

## 2016-07-20 ENCOUNTER — Other Ambulatory Visit: Payer: 59

## 2016-07-20 NOTE — Telephone Encounter (Signed)
Patient called, it was forwarded to me regarding her lab work. I gave patient the lab results that were given. Patient would like to know where to go from here, and if she still needs to see you as an appointment because she is still feeling bad.   Please let Jinny Blossom know and she will advise the patient.   Thank you!

## 2016-07-20 NOTE — Telephone Encounter (Signed)
Since there is no endocrinology problem she will have to follow-up with PCP for further evaluation and treatment

## 2016-07-20 NOTE — Telephone Encounter (Signed)
Called patient and left a voice message to please reach out to her PCP for further evaluation per Dr. Dwyane Dee.

## 2016-07-20 NOTE — Telephone Encounter (Signed)
Patient was given her lab results and she states she is still feeling bad. Please see her call note. Please advise.

## 2016-07-24 ENCOUNTER — Other Ambulatory Visit: Payer: Self-pay | Admitting: Neurology

## 2016-07-27 DIAGNOSIS — Z0000 Encounter for general adult medical examination without abnormal findings: Secondary | ICD-10-CM | POA: Diagnosis not present

## 2016-08-05 ENCOUNTER — Telehealth: Payer: Self-pay | Admitting: *Deleted

## 2016-08-05 NOTE — Telephone Encounter (Signed)
FMLA forms completed and faxed to [facility].  Copy mailed to pt. and copy sent to be scanned into EPIC/fim

## 2016-08-06 ENCOUNTER — Telehealth: Payer: Self-pay | Admitting: Neurology

## 2016-08-06 NOTE — Telephone Encounter (Signed)
Patient called office asking if we can document that she had 6 falls in June.  4 falls were considered "soft falls", 1 fall she went to the ER, and 1 other fall she had 4 broken toes on the right foot (no ER visit).  FYI

## 2016-08-06 NOTE — Telephone Encounter (Signed)
I have spoken with Taylor Hancock this afternoon.  She sts. she was hoping to make it to her 40th anniversary at work but, due to progressive difficulty with gait, she doesn't think she'll be able to.  She may need to apply for permanent disability sooner than she had planned.  I have explained that we are happy to complete disability paperwork for her if she needs [place] to.  She verbalized understanding of same/fim

## 2016-08-06 NOTE — Telephone Encounter (Signed)
Noted/fim 

## 2016-08-06 NOTE — Telephone Encounter (Signed)
Patient called and relayed to Rend send her referral For PT and cognitive visit to Dr. Vikki Ports.  I will resend referral's for her.   Taylor Hancock patient want's you to call her so she give you an update on her MS. Thanks Taylor Hancock    .

## 2016-08-17 NOTE — Telephone Encounter (Signed)
Pt calling to inform that she is preparing to leave work due to M/S due to falling more.  Pt is asking for a call back

## 2016-08-17 NOTE — Telephone Encounter (Signed)
I have spoken with Taylor Hancock this afternoon.  She sts. that due to increased difficulty with gait, falls, cognition, fatigue, she is going to apply for permanent disability.  Appt. given with RAS on 08/19/16 at 0830 to discuss/fim

## 2016-08-17 NOTE — Telephone Encounter (Signed)
LMOM for pt.  I just did FMLA paperwork for her last week, but if she's now planning to apply for perm. disability, and needs me to do additional paperwork, just have it faxed to me at [phone]

## 2016-08-17 NOTE — Telephone Encounter (Signed)
Pt has called back and apologizes for missing the call.  Pt is asking for a call back please.

## 2016-08-19 ENCOUNTER — Telehealth: Payer: Self-pay | Admitting: Neurology

## 2016-08-19 ENCOUNTER — Encounter: Payer: Self-pay | Admitting: Neurology

## 2016-08-19 ENCOUNTER — Ambulatory Visit (INDEPENDENT_AMBULATORY_CARE_PROVIDER_SITE_OTHER): Payer: 59 | Admitting: Neurology

## 2016-08-19 VITALS — BP 148/98 | HR 83 | Resp 18 | Ht 64.0 in | Wt 159.5 lb

## 2016-08-19 DIAGNOSIS — G801 Spastic diplegic cerebral palsy: Secondary | ICD-10-CM

## 2016-08-19 DIAGNOSIS — R5383 Other fatigue: Secondary | ICD-10-CM

## 2016-08-19 DIAGNOSIS — F09 Unspecified mental disorder due to known physiological condition: Secondary | ICD-10-CM

## 2016-08-19 DIAGNOSIS — G35 Multiple sclerosis: Secondary | ICD-10-CM

## 2016-08-19 DIAGNOSIS — R261 Paralytic gait: Secondary | ICD-10-CM | POA: Diagnosis not present

## 2016-08-19 DIAGNOSIS — R200 Anesthesia of skin: Secondary | ICD-10-CM

## 2016-08-19 DIAGNOSIS — M21372 Foot drop, left foot: Secondary | ICD-10-CM

## 2016-08-19 DIAGNOSIS — F329 Major depressive disorder, single episode, unspecified: Secondary | ICD-10-CM

## 2016-08-19 DIAGNOSIS — F32A Depression, unspecified: Secondary | ICD-10-CM

## 2016-08-19 MED ORDER — ESCITALOPRAM OXALATE 10 MG PO TABS: 10.0000 mg | ORAL_TABLET | Freq: Every day | ORAL | 11 refills | Status: DC

## 2016-08-19 NOTE — Telephone Encounter (Signed)
Patient was seen by Dr. Felecia Shelling today but forgot to give other information for short term disability. She has left leg buckling due to 1990 surgery which is causing her to fall in addition to having MS. Please call and discuss.

## 2016-08-19 NOTE — Telephone Encounter (Signed)
I spoke with Taylor Hancock this afternoon and noted problem with left leg buckling/fim

## 2016-08-19 NOTE — Progress Notes (Signed)
[facility]  PATIENT: Taylor Hancock DOB: [birth year]  REFERRING CLINICIAN: Shirline Frees   HISTORY FROM: Patient REASON FOR VISIT: PPMS   HISTORICAL  CHIEF COMPLAINT:  Chief Complaint  Patient presents with  . Multiple Sclerosis    Not on a dmt.  Here today to discuss permanent disability.  Last day of work was 08/18/16.  Having more trouble walking, increased falls, more trouble with cognition/fim     [facility]  PATIENT: Taylor Hancock DOB: [birth year]  REFERRING CLINICIAN: Shirline Frees   HISTORY FROM: Patient REASON FOR VISIT: PPMS   HISTORICAL  CHIEF COMPLAINT:  Chief Complaint  Patient presents with  . Multiple Sclerosis    Not on a dmt.  Here today to discuss permanent disability.  Last day of work was 08/18/16.  Having more trouble walking, increased falls, more trouble with cognition/fim    HISTORY OF PRESENT ILLNESS:  Taylor Hancock is a 56 year old woman with MS diagnosed in 2005.     MS:.  She has a primary progressive MS.  There has been steady progression of her spasticity, weakness and cognitive fog.     Gait/strength:    Hr gait is worse and the left leg seems weaker and more spastic.   She has a left foot drop but not right.    She uses a Rollator walker now almost exclusively.   The left  leg is more spastic and spasticity I often painful.  Clonazepam at bedtime helps sleep and spasticity some and she now takes 4 mg at night.     Tizanidine also helped..  Baclofen and daytime muscle relaxants have been poorly tolerated.  Due to risk of side effects, she did not want to try Ampyra in the past. Her left foot drop does better with a Walk-Aide Neurostimulator on the left.      Sensory:  She gets right > left trunk dysesthesia.  This is stable.   Both of her feet feel numb but not painful  Vision:  She has not had optic neuritis.   She notes reduced depth perception compared to the past. She does not feel safe  driving at night anymore.  Bowel/Bladder:    Bladder function is stable with urinary frequency and urgency.     Rarely she has urge incontinence and this has happened a few times at work.   .   She has rare UTI.   She notes bowel incontinence at times.    Fatigue/sleep;  She has fatigue, worse as day goes on and with heat.   The fatigue is both mental and physical.      She falls asleep easily but may wake up some at night.   She sometimes naps during the day (and if so, sleep is worse at night)  Mood:    She has some job and marital stress and has some depression.    She has some crying spells and can be irritable.   She reports she is not drinking much.       She is not currently on an antidepressant.  She was once on Lexapro and it helped a little bit.   We discussed restarting it (she has 10 mg pills at the house)  Cognitive:   She feels cognition is worse and she is more forgetful.    We discussed the MRI does show some atrophy from the MS which may be playing a large role.    Also fatigue is interacting with  her cognitive issues.  .    She notes that she is often forgetful, worse in the afternoons.    She also has difficulties with verbal fluency. She has some issues with in executive function and has more difficulty with planning and doing complicated tasks.       We discussed work as it is becoming harder for her to focus and she has more errors (cognitive and typographical).  She sometimes forgets what she is doing and needs to start over.   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     She has physical, fatigue and cognitive issues that are all making employment difficulty.    She feels she makes errors all day but this is worse after a few hours when the fatigue kicks in.     MS History:  She had a [place] sign in the 1990's a few times and she felt her neck was weaker at that time.  However, gait was not affected at that time.  She was diagnosed  with multiple sclerosis in 2005 after presenting with several years of worsening gait. Initially, she was diagnosed with relapsing remitting MS and was placed on Betaseron. She had difficulty tolerating Betaseron but did continue to take it. Her first MRI after the diagnosis was unchanged from her previous one. A couple years later she transferred her care to me. After review of her time course of disease, it was apparent that she had a progressive MS and not a relapsing form of MS. Therefore, the Betaseron was discontinued.    REVIEW OF SYSTEMS:  Constitutional: No fevers, chills, sweats, or change in appetite.  Notes fatigue.   Has insomnia and sleepiness Eyes: No visual changes, double vision.  Notes light sensitivity and eye pain Ear, nose and throat: No hearing loss, ear pain, nasal congestion, sore throat.    Cardiovascular: No chest pain, palpitations Respiratory:  No shortness of breath at rest or with exertion.   No wheezes GastrointestinaI: No nausea, vomiting, diarrhea, abdominal pain.  Occ fecal incontinence Genitourinary:  as above.    Musculoskeletal:  No neck pain, back pain.   Has myalgias Integumentary: No rash, pruritus, skin lesions.   Edema in feet Neurological: as above Psychiatric: Increased depression and anxiety  Endocrine: No palpitations, diaphoresis, change in appetite, change in weigh or increased thirst Hematologic/Lymphatic:  No anemia, purpura, petechiae. Allergic/Immunologic: No itchy/runny eyes, nasal congestion, recent allergic reactions, rashes  ALLERGIES: Allergies  Allergen Reactions  . Levaquin [Levofloxacin In D5w]   . Ppa-Mma Express [Alitraq] Nausea And Vomiting    Blood in vomit and back felt like it was breaking.   Marland Kitchen Zithromax [Azithromycin] Other (See Comments)    abd cramping  . Ultram [Tramadol Hcl] Anxiety    HOME MEDICATIONS: Outpatient Medications Prior to Visit  Medication Sig Dispense Refill  . clonazePAM (KLONOPIN) 1 MG tablet Take  4 tablets at bedtime. 360 tablet 1  . cyclobenzaprine (FLEXERIL) 5 MG tablet TAKE 1 TO 2 TABLETS AT BEDTIME 60 tablet 11  . tiZANidine (ZANAFLEX) 4 MG tablet TAKE 2 TABLETS (8 MG TOTAL) BY MOUTH AT BEDTIME. 180 tablet 3   No facility-administered medications prior to visit.     PAST MEDICAL HISTORY: Past Medical History:  Diagnosis Date  . Concussion   . Family history of breast cancer   . Family history of ovarian cancer   . Family history of pancreatic cancer   . Movement disorder   .  MS (multiple sclerosis) ([place])   . Multiple sclerosis ([place])   . Neuropathy   . Vision abnormalities     PAST SURGICAL HISTORY: Past Surgical History:  Procedure Laterality Date  . FRACTURE SURGERY Left   . I&D EXTREMITY  05/30/2011   Procedure: IRRIGATION AND DEBRIDEMENT EXTREMITY;  Surgeon: Tennis Must, MD;  Location: [facility];  Service: Orthopedics;;  Incision and drainage of open Proximal interphalangeal joint with closed reduction of Proximal interphalangeal  joint left long finger  . KNEE ARTHROSCOPY      FAMILY HISTORY: Family History  Problem Relation Age of Onset  . Hypertension Mother   . Breast cancer Mother 39  . Hypothyroidism Mother   . Hyperlipidemia Father   . Diabetes Father   . Pancreatic cancer Father 38  . Hypothyroidism Sister   . Ovarian cancer Maternal Aunt        dx in her 34s  . Parkinson's disease Maternal Uncle   . Stroke Maternal Uncle   . Breast cancer Maternal Grandmother 100  . Breast cancer Maternal Aunt        dx under 96  . Breast cancer Maternal Aunt        dx over 64s  . Breast cancer Maternal Aunt        dx >50  . Breast cancer Other   . Hypothyroidism Brother     SOCIAL HISTORY:  Social History   Social History  . Marital status: Married    Spouse name: N/A  . Number of children: N/A  . Years of education: N/A   Occupational History  . Not on file.   Social History Main Topics  . Smoking status: Former Smoker    Packs/day: 0.50     Types: Cigarettes    Quit date: 11/25/2012  . Smokeless tobacco: Never Used  . Alcohol use 0.0 oz/week     Comment: 2 daily  . Drug use: No  . Sexual activity: Not on file   Other Topics Concern  . Not on file   Social History Narrative  . No narrative on file     PHYSICAL EXAM  Vitals:   08/19/16 0833  BP: (!) 179/105  Pulse: 95  Resp: 18  Weight: 159 lb 8 oz (72.3 kg)  Height: 5\' 4"  (1.626 m)    Body mass index is 27.38 kg/m.   General: The patient is well-developed and well-nourished and in mild distress.   Neurologic Exam  Mental status: The patient is alert and oriented x 3 at the time of the examination. She has reduced concentration and had trouble coming up with words at times.    Cranial nerves: Extraocular movements are full.    Facial strength and sensation is normal. Palatal elevation. Tongue protrusion is midline. Hearing is symmetrically   Motor:  Muscle bulk is normal. She has mild right and moderate left increased tone. Strength is 3 to 4-/5 in the left leg and hip flexor and 4/5 in the right upper leg and 4+/5 in the lower right .  Sensory: Sensory testing is intact to touch and vibration sensation on all 4 extremities..   Coordination: Cerebellar testing reveals good right but reduced left finger-nose-finger and poor heel-to-shin (poor right and unable to do left)   Gait and station: Station is stable and gait is spastic on the left with wide stance and reduced stride.  She needs a walker to take steps.    She cannot do a tandem walk.  Reflexes: Deep tendon reflexes are symmetric and normal in the arms but she has increased reflexes in both legs with nonsustained clonus at the left ankle.     DIAGNOSTIC DATA (LABS, IMAGING, TESTING) - I reviewed patient records, labs, notes, testing and imaging myself where available.     ASSESSMENT AND PLAN   Multiple sclerosis, primary progressive (HCC)  Spastic diplegia (HCC)  Mild cognitive  disorder  Spastic gait  Foot drop, left  Numbness  Depression, unspecified depression type  Other fatigue    1.   Continue night time spasticity medications. 2.   Renew clonazepam.   Resume Lexapro.   3.   We have discussed the advantages and disadvantages of ocrelizumab based on the phase 3 study for primary progressive MS.     Due to the risk of breast cancer (strong FH or Breast cancer) she had genetic testing and was told her breast cancer genes were negative.   4.   Due to a combination of physical and cognitive impairments along with fatigue from her primary progressive MS, she is unable to continue to work.   This is permanent.  MRI shows 5 MS plaques in her cervical spine and some cord atrophy.   MRI brain shows atrophy.   I agree with her decision to pursue disability and we will help her with paperwork.  She will return to see me in 4-5 months, sooner if she has new or worsening neurologic symptoms.      45 minute face to face evaluation with greater than 1/2 the time counseling or coordinating care about her MS and symptoms  Renelle Stegenga A. Felecia Shelling, MD, PhD 05/24/3746, 2:70 AM Certified in Neurology, Clinical Neurophysiology, Sleep Medicine, Pain Medicine and Neuroimaging  [facility] [address], [place], [place] [address] [phone]'

## 2016-08-25 DIAGNOSIS — S80862A Insect bite (nonvenomous), left lower leg, initial encounter: Secondary | ICD-10-CM | POA: Diagnosis not present

## 2016-08-25 DIAGNOSIS — W57XXXA Bitten or stung by nonvenomous insect and other nonvenomous arthropods, initial encounter: Secondary | ICD-10-CM | POA: Diagnosis not present

## 2016-08-25 DIAGNOSIS — S80861A Insect bite (nonvenomous), right lower leg, initial encounter: Secondary | ICD-10-CM | POA: Diagnosis not present

## 2016-09-09 ENCOUNTER — Ambulatory Visit: Payer: 59 | Admitting: Rehabilitative and Restorative Service Providers"

## 2016-09-14 ENCOUNTER — Ambulatory Visit (INDEPENDENT_AMBULATORY_CARE_PROVIDER_SITE_OTHER): Payer: BLUE CROSS/BLUE SHIELD | Admitting: Physical Therapy

## 2016-09-14 DIAGNOSIS — R2689 Other abnormalities of gait and mobility: Secondary | ICD-10-CM

## 2016-09-14 NOTE — Therapy (Signed)
[place], [place], [address] Phone: [phone]   Fax:  [phone]  Physical Therapy Evaluation  Patient Details  Name: Taylor Hancock MRN: [id number] Date of Birth: 30-Jan-1960 No Data Recorded  Encounter Date: 09/14/2016      PT End of Session - 09/14/16 1400    Visit Number 1   PT Start Time 1400, out 51      Past Medical History:  Diagnosis Date  . Concussion   . Family history of breast cancer   . Family history of ovarian cancer   . Family history of pancreatic cancer   . Movement disorder   . MS (multiple sclerosis) ([place])   . Multiple sclerosis ([place])   . Neuropathy   . Vision abnormalities     Past Surgical History:  Procedure Laterality Date  . FRACTURE SURGERY Left   . I&D EXTREMITY  05/30/2011   Procedure: IRRIGATION AND DEBRIDEMENT EXTREMITY;  Surgeon: Tennis Must, MD;  Location: [facility];  Service: Orthopedics;;  Incision and drainage of open Proximal interphalangeal joint with closed reduction of Proximal interphalangeal  joint left long finger  . KNEE ARTHROSCOPY      There were no vitals filed for this visit.       Subjective Assessment - 09/14/16 1400    Subjective Pt presented for eval, reports her MD wants her to see someone who specialized in dealing with MS.                  Objective measurements completed on examination: See above findings.                               Plan - 09/14/16 1416    Clinical Impression Statement Discussed care with patient and her needs.  Explained that we do not special in [place] and jointly decided that her needs would be better met somewhere else.  She prefers to stay in the [place] area if possible.  We called [facility] with [facility] and they have neuro specialists that can treat her.  Waiver was signed for [place] to fax them her PT order and this was sent to them.     Consulted and Agree  with Plan of Care Patient      Patient will benefit from skilled therapeutic intervention in order to improve the following deficits and impairments:     Visit Diagnosis: Other abnormalities of gait and mobility     Problem List Patient Active Problem List   Diagnosis Date Noted  . Mild cognitive disorder 06/09/2016  . Genetic testing 12/17/2015  . Family history of breast cancer   . Family history of ovarian cancer   . Family history of pancreatic cancer   . Insomnia 06/10/2015  . Urinary frequency 06/10/2015  . Frequent falls 12/26/2014  . Snoring 12/26/2014  . Depression 08/01/2014  . Other fatigue 08/01/2014  . Multiple sclerosis, primary progressive ([place]) 02/07/2014  . Spastic gait 02/07/2014  . Spastic diplegia ([place]) 02/07/2014  . Numbness 02/07/2014  . Foot drop, left 02/07/2014    Jeral Pinch PT  09/14/2016, 2:18 PM  [facility] [place], [place], [address] Phone: [phone]   Fax:  [phone]  Name: Taylor Hancock MRN: [id number] Date of Birth: 1960-04-05

## 2016-09-22 NOTE — Telephone Encounter (Signed)
Patient called office in reference to short term disability with [facility].  Patient would like to know if we received Medical records request from [place] with [facility] requesting patients medical records.  Patient has been out of work since August 1st.  Please call

## 2016-09-23 DIAGNOSIS — G35 Multiple sclerosis: Secondary | ICD-10-CM | POA: Diagnosis not present

## 2016-10-09 DIAGNOSIS — Z01419 Encounter for gynecological examination (general) (routine) without abnormal findings: Secondary | ICD-10-CM | POA: Diagnosis not present

## 2016-10-19 DIAGNOSIS — G801 Spastic diplegic cerebral palsy: Secondary | ICD-10-CM | POA: Diagnosis not present

## 2016-10-19 DIAGNOSIS — G35 Multiple sclerosis: Secondary | ICD-10-CM | POA: Diagnosis not present

## 2016-10-19 DIAGNOSIS — M21372 Foot drop, left foot: Secondary | ICD-10-CM | POA: Diagnosis not present

## 2016-11-02 DIAGNOSIS — N950 Postmenopausal bleeding: Secondary | ICD-10-CM | POA: Diagnosis not present

## 2016-11-20 DIAGNOSIS — Z7409 Other reduced mobility: Secondary | ICD-10-CM | POA: Diagnosis not present

## 2016-12-13 ENCOUNTER — Other Ambulatory Visit: Payer: Self-pay | Admitting: Neurology

## 2016-12-16 DIAGNOSIS — B354 Tinea corporis: Secondary | ICD-10-CM | POA: Diagnosis not present

## 2016-12-16 DIAGNOSIS — R7989 Other specified abnormal findings of blood chemistry: Secondary | ICD-10-CM | POA: Diagnosis not present

## 2016-12-21 DIAGNOSIS — G35 Multiple sclerosis: Secondary | ICD-10-CM | POA: Diagnosis not present

## 2016-12-21 DIAGNOSIS — M21372 Foot drop, left foot: Secondary | ICD-10-CM | POA: Diagnosis not present

## 2016-12-21 DIAGNOSIS — G801 Spastic diplegic cerebral palsy: Secondary | ICD-10-CM | POA: Diagnosis not present

## 2017-01-27 ENCOUNTER — Ambulatory Visit (INDEPENDENT_AMBULATORY_CARE_PROVIDER_SITE_OTHER): Payer: 59 | Admitting: Neurology

## 2017-01-27 ENCOUNTER — Other Ambulatory Visit: Payer: Self-pay

## 2017-01-27 ENCOUNTER — Encounter: Payer: Self-pay | Admitting: Neurology

## 2017-01-27 ENCOUNTER — Encounter (INDEPENDENT_AMBULATORY_CARE_PROVIDER_SITE_OTHER): Payer: Self-pay

## 2017-01-27 VITALS — BP 134/90 | HR 78 | Resp 16 | Ht 64.0 in | Wt 160.0 lb

## 2017-01-27 DIAGNOSIS — R5383 Other fatigue: Secondary | ICD-10-CM | POA: Diagnosis not present

## 2017-01-27 DIAGNOSIS — G35 Multiple sclerosis: Secondary | ICD-10-CM

## 2017-01-27 DIAGNOSIS — G801 Spastic diplegic cerebral palsy: Secondary | ICD-10-CM

## 2017-01-27 DIAGNOSIS — R3911 Hesitancy of micturition: Secondary | ICD-10-CM | POA: Diagnosis not present

## 2017-01-27 DIAGNOSIS — F09 Unspecified mental disorder due to known physiological condition: Secondary | ICD-10-CM | POA: Diagnosis not present

## 2017-01-27 DIAGNOSIS — R261 Paralytic gait: Secondary | ICD-10-CM

## 2017-01-27 NOTE — Progress Notes (Signed)
[facility]  PATIENT: Taylor Hancock DOB: 10-17-1960  REFERRING CLINICIAN: Shirline Frees   HISTORY FROM: Patient REASON FOR VISIT: PPMS   HISTORICAL  CHIEF COMPLAINT:  Chief Complaint  Patient presents with  . Multiple Sclerosis    Remains off of dmt.  Would like to discuss long term disability.  Sts. is having more trouble with gait/balance/falls, memory/fim     [facility]  PATIENT: Taylor Hancock DOB: December 04, 1960  REFERRING CLINICIAN: Shirline Frees   HISTORY FROM: Patient REASON FOR VISIT: PPMS   HISTORICAL  CHIEF COMPLAINT:  Chief Complaint  Patient presents with  . Multiple Sclerosis    Remains off of dmt.  Would like to discuss long term disability.  Sts. is having more trouble with gait/balance/falls, memory/fim    HISTORY OF PRESENT ILLNESS:  Taylor Hancock is a 57 year old woman with MS diagnosed in 2005.     Update 01/26/2017:   She has primary progressive MS.   As there are no great treatments for PPMS, she is off of any DMT.   Ocrelizumab has been discussed in the past but due to limited benefit and infectious risk she has decided not to start.    She is noting more weakness in her legs, left worse than right.   The right leg weakness has come on much more over the past couple years.    Gait is spastic and off balanced and she has multiple falls.    She is no longer able to take any steps without falling.   Even with her walker, she is falling.   She is doing PT [employer] center at [facility]) and feels they have helped her some but her gait continues to  progressively worsen.   They have helped her make more adaptions to her house, especially in the bathroom.   She is noting more difficulty with her hands and typing is slower with more mistakes.   Handwriting is worse and her hand spasms with writing.   Grip strength is reduced.    She has more difficulty raising her hands overhead.    She is on clonazepam,  Zanaflex and Flexerill  Her fatigue continues and has worsened over the past year.  She also has had more cognitive issues with reduced executive function, focus and attention.    She often has trouble with verbal fluency.   Multi-tasking is more difficult.     She has some depression, helped a little by Lexapro.  Constipation is a problem at times.   Has also had a few episodes of fecal incontinence.  Urinary hesitancy is worse.     She also has urinary frequency an occasional incontinence.     From 08/19/2016: MS:.  She has a primary progressive MS.  There has been steady progression of her spasticity, weakness and cognitive fog.     Gait/strength:    Hr gait is worse and the left leg seems weaker and more spastic.   She has a left foot drop but not right.    She uses a Rollator walker now almost exclusively.   The left leg is more spastic and spasticity I often painful.  Clonazepam at bedtime helps sleep and spasticity some and she now takes 4 mg at night.     Tizanidine also helped..  Baclofen and daytime muscle relaxants have been poorly tolerated.  Due to risk of side effects, she did not want to try Ampyra in the past. Her left foot  drop does better with a Walk-Aide Neurostimulator on the left.      Sensory:  She gets right > left trunk dysesthesia.  This is stable.   Both of her feet feel numb but not painful  Vision:  She has not had optic neuritis.   She notes reduced depth perception compared to the past. She does not feel safe driving at night anymore.  Bowel/Bladder:    Bladder function is stable with urinary frequency and urgency.     Rarely she has urge incontinence and this has happened a few times at work.   .   She has rare UTI.   She notes bowel incontinence at times.    Fatigue/sleep;  She has fatigue, worse as day goes on and with heat.   The fatigue is both mental and physical.      She falls asleep easily but may wake up some at night.   She sometimes naps during the day (and  if so, sleep is worse at night)  Mood:    She has some job and marital stress and has some depression.    She has some crying spells and can be irritable.   She reports she is not drinking much.       She is not currently on an antidepressant.  She was once on Lexapro and it helped a little bit.   We discussed restarting it (she has 10 mg pills at the house)  Cognitive:   She feels cognition is worse and she is more forgetful.    We discussed the MRI does show some atrophy from the MS which may be playing a large role.    Also fatigue is interacting with her cognitive issues.  .    She notes that she is often forgetful, worse in the afternoons.    She also has difficulties with verbal fluency. She has some issues with in executive function and has more difficulty with planning and doing complicated tasks.       We discussed work as it is becoming harder for her to focus and she has more errors (cognitive and typographical).  She sometimes forgets what she is doing and needs to start over.   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     She has physical, fatigue and cognitive issues that are all making employment difficulty.    She feels she makes errors all day but this is worse after a few hours when the fatigue kicks in.     MS History:  She had a [place] sign in the 1990's a few times and she felt her neck was weaker at that time.  However, gait was not affected at that time.  She was diagnosed with multiple sclerosis in 2005 after presenting with several years of worsening gait. Initially, she was diagnosed with relapsing remitting MS and was placed on Betaseron. She had difficulty tolerating Betaseron but did continue to take it. Her first MRI after the diagnosis was unchanged from her previous one. A couple years later she transferred her care to me. After review of her time course of disease, it was apparent that she had a progressive MS and not a relapsing  form of MS. Therefore, the Betaseron was discontinued.    REVIEW OF SYSTEMS:  Constitutional: No fevers, chills, sweats, or change in appetite.  Notes fatigue.   Has insomnia and sleepiness Eyes: No visual changes, double vision.  Notes  light sensitivity and eye pain Ear, nose and throat: No hearing loss, ear pain, nasal congestion, sore throat.    Cardiovascular: No chest pain, palpitations Respiratory:  No shortness of breath at rest or with exertion.   No wheezes GastrointestinaI: No nausea, vomiting, diarrhea, abdominal pain.  Occ fecal incontinence Genitourinary:  see above.    Musculoskeletal:  She has myalgias Integumentary: No rash, pruritus, skin lesions.   Edema in feet Neurological: as above Psychiatric: Notes depression and anxiety  Endocrine: No palpitations, diaphoresis, change in appetite, change in weigh or increased thirst Hematologic/Lymphatic:  No anemia, purpura, petechiae. Allergic/Immunologic: No itchy/runny eyes, nasal congestion, recent allergic reactions, rashes  ALLERGIES: Allergies  Allergen Reactions  . Levaquin [Levofloxacin In D5w]   . Ppa-Mma Express [Compleat] Nausea And Vomiting    Blood in vomit and back felt like it was breaking.   Marland Kitchen Zithromax [Azithromycin] Other (See Comments)    abd cramping  . Ultram [Tramadol Hcl] Anxiety    HOME MEDICATIONS: Outpatient Medications Prior to Visit  Medication Sig Dispense Refill  . clonazePAM (KLONOPIN) 1 MG tablet TAKE 4 TABLETS BY MOUTH AT BEDTIME 360 tablet 1  . cyclobenzaprine (FLEXERIL) 5 MG tablet TAKE 1 TO 2 TABLETS AT BEDTIME 60 tablet 11  . escitalopram (LEXAPRO) 10 MG tablet Take 1 tablet (10 mg total) by mouth daily. 30 tablet 11  . tiZANidine (ZANAFLEX) 4 MG tablet TAKE 2 TABLETS (8 MG TOTAL) BY MOUTH AT BEDTIME. 180 tablet 3   No facility-administered medications prior to visit.     PAST MEDICAL HISTORY: Past Medical History:  Diagnosis Date  . Concussion   . Family history of breast  cancer   . Family history of ovarian cancer   . Family history of pancreatic cancer   . Movement disorder   . MS (multiple sclerosis) ([place])   . Multiple sclerosis ([place])   . Neuropathy   . Vision abnormalities     PAST SURGICAL HISTORY: Past Surgical History:  Procedure Laterality Date  . FRACTURE SURGERY Left   . I&D EXTREMITY  05/30/2011   Procedure: IRRIGATION AND DEBRIDEMENT EXTREMITY;  Surgeon: Tennis Must, MD;  Location: [facility];  Service: Orthopedics;;  Incision and drainage of open Proximal interphalangeal joint with closed reduction of Proximal interphalangeal  joint left long finger  . KNEE ARTHROSCOPY      FAMILY HISTORY: Family History  Problem Relation Age of Onset  . Hypertension Mother   . Breast cancer Mother 34  . Hypothyroidism Mother   . Hyperlipidemia Father   . Diabetes Father   . Pancreatic cancer Father 92  . Hypothyroidism Sister   . Ovarian cancer Maternal Aunt        dx in her 33s  . Parkinson's disease Maternal Uncle   . Stroke Maternal Uncle   . Breast cancer Maternal Grandmother 100  . Breast cancer Maternal Aunt        dx under 46  . Breast cancer Maternal Aunt        dx over 56s  . Breast cancer Maternal Aunt        dx >50  . Breast cancer Other   . Hypothyroidism Brother     SOCIAL HISTORY:  Social History   Socioeconomic History  . Marital status: Married    Spouse name: Not on file  . Number of children: Not on file  . Years of education: Not on file  . Highest education level: Not on file  Social Needs  .  Financial resource strain: Not on file  . Food insecurity - worry: Not on file  . Food insecurity - inability: Not on file  . Transportation needs - medical: Not on file  . Transportation needs - non-medical: Not on file  Occupational History  . Not on file  Tobacco Use  . Smoking status: Former Smoker    Packs/day: 0.50    Types: Cigarettes    Last attempt to quit: 11/25/2012    Years since quitting: 4.1  .  Smokeless tobacco: Never Used  Substance and Sexual Activity  . Alcohol use: Yes    Alcohol/week: 0.0 oz    Comment: 2 daily  . Drug use: No  . Sexual activity: Not on file  Other Topics Concern  . Not on file  Social History Narrative  . Not on file     PHYSICAL EXAM  Vitals:   01/27/17 1259  BP: 134/90  Pulse: 78  Resp: 16  Weight: 160 lb (72.6 kg)  Height: 5\' 4"  (1.626 m)    Body mass index is 27.46 kg/m.   Hancock: The patient is well-developed and well-nourished and in mild distress.   Neurologic Exam  Mental status: The patient is alert and oriented x 3 at the time of the examination. She has reduced concentration and had trouble coming up with words at times.    Cranial nerves: Extraocular movements are full.    Facial strength and sensation is normal. Trapezius strength is norma Palatal elevation. Tongue protrusion is midline. Hearing is symmetrically   Motor:  Muscle bulk is normal. She has moderate left and mild right increased tone. Strength is 2+to 3/5 in the left leg and hip flexor and 4/5 in the right upper leg and 4+/5 in the lower right .   Left hand is 4/5.     Sensory: She has reduced sensation to vibration in the hands relative to the shoulder.   Reduced vibration in left leg.  Touch is more symmetric  Coordination: Cerebellar testing reveals reduced left left finger-nose-finger.   She has reduced heel-to-shin on the right and isunable to do on her left  Gait and station: Station is unstable and requires bilateral support.  She can take some steps with her walker and is spastic on the left with wide stance and reduced stride.     Reflexes: Deep tendon reflexes are symmetric and normal in the arms but she has increased reflexes in both legs. Left > right,  with nonsustained clonus at the left ankle.     DIAGNOSTIC DATA (LABS, IMAGING, TESTING) - I reviewed patient records, labs, notes, testing and imaging myself where available.     ASSESSMENT  AND PLAN   Multiple sclerosis, primary progressive ([facility])  Spastic gait  Spastic diplegia ([facility])  Mild cognitive disorder  Other fatigue  Urinary hesitancy   1.    She has a combination of physical (weakness, spasticity, very poor gait, hand clumsiness/weakness) and cognitive impairments and fatigue due to the primary progressive MS.   These impairments are permanent.  The impairments are noted on exam.   MRI shows 5 MS plaques in her cervical spine and some spinal cord atrophy.   MRI brain shows atrophy.    2.   Continue medications for MS symptoms.   Consider referral to urology / urodynamic testing to help guide medical therapy (she has both urinary frequency and hesitancy).  She wishes to hold off at this time. 3.   She will return to see  me in 4-5 months, sooner if she has new or worsening neurologic symptoms.      45 minute face to face evaluation with greater than 1/2 the time counseling or coordinating care about her MS and symptoms  Richard A. Felecia Shelling, MD, PhD 04/19/9620, 2:97 PM Certified in Neurology, Clinical Neurophysiology, Sleep Medicine, Pain Medicine and Neuroimaging  [facility] [address] [place], [place] [address] [phone]'

## 2017-02-06 ENCOUNTER — Other Ambulatory Visit: Payer: Self-pay | Admitting: Family Medicine

## 2017-02-06 DIAGNOSIS — R911 Solitary pulmonary nodule: Secondary | ICD-10-CM

## 2017-02-19 ENCOUNTER — Ambulatory Visit
Admission: RE | Admit: 2017-02-19 | Discharge: 2017-02-19 | Disposition: A | Discharge status: Home / Self Care | Payer: BLUE CROSS/BLUE SHIELD | Source: Ambulatory Visit | Attending: Family Medicine | Admitting: Family Medicine

## 2017-02-19 DIAGNOSIS — R911 Solitary pulmonary nodule: Secondary | ICD-10-CM

## 2017-02-26 ENCOUNTER — Other Ambulatory Visit: Payer: Self-pay | Admitting: Family Medicine

## 2017-02-26 DIAGNOSIS — N63 Unspecified lump in unspecified breast: Secondary | ICD-10-CM

## 2017-03-03 ENCOUNTER — Ambulatory Visit
Admission: RE | Admit: 2017-03-03 | Discharge: 2017-03-03 | Disposition: A | Discharge status: Home / Self Care | Payer: BLUE CROSS/BLUE SHIELD | Source: Ambulatory Visit | Attending: Family Medicine | Admitting: Family Medicine

## 2017-03-03 ENCOUNTER — Other Ambulatory Visit: Payer: Self-pay | Admitting: Family Medicine

## 2017-03-03 DIAGNOSIS — N6489 Other specified disorders of breast: Secondary | ICD-10-CM | POA: Diagnosis not present

## 2017-03-03 DIAGNOSIS — N63 Unspecified lump in unspecified breast: Secondary | ICD-10-CM

## 2017-03-03 DIAGNOSIS — N6320 Unspecified lump in the left breast, unspecified quadrant: Secondary | ICD-10-CM

## 2017-03-03 DIAGNOSIS — R928 Other abnormal and inconclusive findings on diagnostic imaging of breast: Secondary | ICD-10-CM | POA: Diagnosis not present

## 2017-03-04 ENCOUNTER — Other Ambulatory Visit: Payer: Self-pay | Admitting: Family Medicine

## 2017-03-04 ENCOUNTER — Ambulatory Visit
Admission: RE | Admit: 2017-03-04 | Discharge: 2017-03-04 | Disposition: A | Discharge status: Home / Self Care | Payer: BLUE CROSS/BLUE SHIELD | Source: Ambulatory Visit | Attending: Family Medicine | Admitting: Family Medicine

## 2017-03-04 DIAGNOSIS — N6320 Unspecified lump in the left breast, unspecified quadrant: Secondary | ICD-10-CM

## 2017-03-04 DIAGNOSIS — N6321 Unspecified lump in the left breast, upper outer quadrant: Secondary | ICD-10-CM | POA: Diagnosis not present

## 2017-03-04 DIAGNOSIS — C50412 Malignant neoplasm of upper-outer quadrant of left female breast: Secondary | ICD-10-CM | POA: Diagnosis not present

## 2017-03-05 ENCOUNTER — Encounter: Payer: Self-pay | Admitting: *Deleted

## 2017-03-09 ENCOUNTER — Other Ambulatory Visit: Payer: Self-pay | Admitting: *Deleted

## 2017-03-09 DIAGNOSIS — C50412 Malignant neoplasm of upper-outer quadrant of left female breast: Secondary | ICD-10-CM

## 2017-03-09 DIAGNOSIS — Z170 Estrogen receptor positive status [ER+]: Principal | ICD-10-CM | POA: Insufficient documentation

## 2017-03-10 ENCOUNTER — Other Ambulatory Visit: Payer: Self-pay

## 2017-03-10 ENCOUNTER — Inpatient Hospital Stay: Payer: 59 | Attending: Hematology and Oncology | Admitting: Hematology and Oncology

## 2017-03-10 ENCOUNTER — Other Ambulatory Visit: Payer: Self-pay | Admitting: General Surgery

## 2017-03-10 ENCOUNTER — Encounter: Payer: Self-pay | Admitting: Radiation Oncology

## 2017-03-10 ENCOUNTER — Encounter: Payer: Self-pay | Admitting: Hematology and Oncology

## 2017-03-10 ENCOUNTER — Ambulatory Visit
Admission: RE | Admit: 2017-03-10 | Discharge: 2017-03-10 | Disposition: A | Discharge status: Home / Self Care | Payer: BLUE CROSS/BLUE SHIELD | Source: Ambulatory Visit | Attending: Radiation Oncology | Admitting: Radiation Oncology

## 2017-03-10 ENCOUNTER — Ambulatory Visit: Payer: 59 | Attending: General Surgery | Admitting: Physical Therapy

## 2017-03-10 ENCOUNTER — Inpatient Hospital Stay: Payer: 59

## 2017-03-10 DIAGNOSIS — G35 Multiple sclerosis: Secondary | ICD-10-CM | POA: Diagnosis not present

## 2017-03-10 DIAGNOSIS — C50412 Malignant neoplasm of upper-outer quadrant of left female breast: Secondary | ICD-10-CM | POA: Insufficient documentation

## 2017-03-10 DIAGNOSIS — Z170 Estrogen receptor positive status [ER+]: Secondary | ICD-10-CM | POA: Insufficient documentation

## 2017-03-10 DIAGNOSIS — R293 Abnormal posture: Secondary | ICD-10-CM | POA: Diagnosis not present

## 2017-03-10 DIAGNOSIS — Z8041 Family history of malignant neoplasm of ovary: Secondary | ICD-10-CM

## 2017-03-10 DIAGNOSIS — Z803 Family history of malignant neoplasm of breast: Secondary | ICD-10-CM | POA: Diagnosis not present

## 2017-03-10 DIAGNOSIS — Z87891 Personal history of nicotine dependence: Secondary | ICD-10-CM | POA: Diagnosis not present

## 2017-03-10 DIAGNOSIS — M6281 Muscle weakness (generalized): Secondary | ICD-10-CM | POA: Insufficient documentation

## 2017-03-10 LAB — CBC WITH DIFFERENTIAL (CANCER CENTER ONLY)
BASOS ABS: 0.1 10*3/uL (ref 0.0–0.1)
Basophils Relative: 1 %
EOS PCT: 2 %
Eosinophils Absolute: 0.1 10*3/uL (ref 0.0–0.5)
HEMATOCRIT: 44.9 % (ref 34.8–46.6)
Hemoglobin: 15.2 g/dL (ref 11.6–15.9)
LYMPHS PCT: 35 %
Lymphs Abs: 2.1 10*3/uL (ref 0.9–3.3)
MCH: 35.6 pg — AB (ref 25.1–34.0)
MCHC: 33.9 g/dL (ref 31.5–36.0)
MCV: 105 fL — AB (ref 79.5–101.0)
Monocytes Absolute: 0.4 10*3/uL (ref 0.1–0.9)
Monocytes Relative: 7 %
Neutro Abs: 3.3 10*3/uL (ref 1.5–6.5)
Neutrophils Relative %: 55 %
PLATELETS: 197 10*3/uL (ref 145–400)
RBC: 4.27 MIL/uL (ref 3.70–5.45)
RDW: 12.8 % (ref 11.2–14.5)
WBC Count: 6.1 10*3/uL (ref 3.9–10.3)

## 2017-03-10 LAB — CMP (CANCER CENTER ONLY)
ALBUMIN: 3.9 g/dL (ref 3.5–5.0)
ALK PHOS: 60 U/L (ref 40–150)
ALT: 35 U/L (ref 0–55)
AST: 43 U/L — AB (ref 5–34)
Anion gap: 14 — ABNORMAL HIGH (ref 3–11)
BILIRUBIN TOTAL: 0.6 mg/dL (ref 0.2–1.2)
BUN: 7 mg/dL (ref 7–26)
CO2: 24 mmol/L (ref 22–29)
Calcium: 9.6 mg/dL (ref 8.4–10.4)
Chloride: 105 mmol/L (ref 98–109)
Creatinine: 0.77 mg/dL (ref 0.60–1.10)
GFR, Est AFR Am: >60 mL/min (ref >60)
GFR, Estimated: >60 mL/min (ref >60)
GLUCOSE: 102 mg/dL (ref 70–140)
POTASSIUM: 4.2 mmol/L (ref 3.5–5.1)
SODIUM: 143 mmol/L (ref 136–145)
TOTAL PROTEIN: 7.5 g/dL (ref 6.4–8.3)

## 2017-03-10 NOTE — Progress Notes (Addendum)
Radiation Oncology         [phone] ________________________________  Initial outpatient Consultation  Name: Taylor Hancock MRN: [id number]  Date: 03/10/2017  DOB: [birth year]  [id number], Gwyndolyn Saxon, MD  Stark Klein, MD   REFERRING PHYSICIAN: Stark Klein, MD  DIAGNOSIS:    ICD-10-CM   1. Malignant neoplasm of upper-outer quadrant of left breast in female, estrogen receptor positive ([place]) C50.412    Z17.0    Stage clinical 1a (T1a N0 M0) Left Breast UOQ Invasive Ductal Carcinoma with DCIS, ER+ / PR+ / Her2+, Grade II  CHIEF COMPLAINT: Here to discuss management of left breast cancer  HISTORY OF PRESENT ILLNESS::Taylor Hancock is a 57 y.o. female who presented for a CT scan on 02/19/17 given her history of pulmonary nodules for 3 years and history of being a former smoker for 2 years. CT scan showed the 3 mm nodule within the right middle lobe is stable; however, a 6 mm mass within the lateral portion of the left breast was noted. Mammogram of the left breast on 03/03/16 showed a irregular hypoechoic mass in the left breast at 2 o'clock, 7 cm from the nipple measuring 3 x 4 x 4 mm. No enlarged adenopathy of the left axilla. Biopsy of the left breast mass on 03/04/17 showed grade II invasive ductal carcinoma, DCIS with calcifications. Patient presents to multidisciplinary clinic to discuss the role of radiation therapy as part of her disease management.  Patient has an extensive family history of cancer including her mother who was diagnosed with breast cancer at 61. 3 maternal aunts with breast cancer. Genetic testing was reportedly  negative.  Patient reports weight change, occasional loss of sleep, all over pain due to MS, glasses, contacts, left eye vision changes, difficulty swallowing, hoarse voice, feet swelling, change in stool, dribbling urine, incontinence, breast pain when laying on left side, joint pain, difficulties walking, numbness, forgetfulness, anxiety, depression,  occasional suicidal thoughts, hot flashes, and submandibular swollen lymph glands.   PREVIOUS RADIATION THERAPY: No  PAST MEDICAL HISTORY:  has a past medical history of Anxiety, Concussion, Depression, Family history of breast cancer, Family history of ovarian cancer, Family history of pancreatic cancer, Movement disorder, MS (multiple sclerosis) ([place]), Multiple sclerosis ([place]), Neuropathy, and Vision abnormalities.    PAST SURGICAL HISTORY: Past Surgical History:  Procedure Laterality Date  . FRACTURE SURGERY Left   . I&D EXTREMITY  05/30/2011   Procedure: IRRIGATION AND DEBRIDEMENT EXTREMITY;  Surgeon: Tennis Must, MD;  Location: [facility];  Service: Orthopedics;;  Incision and drainage of open Proximal interphalangeal joint with closed reduction of Proximal interphalangeal  joint left long finger  . KNEE ARTHROSCOPY      FAMILY HISTORY: family history includes Breast cancer in her cousin, maternal aunt, maternal aunt, maternal aunt, other, and sister; Breast cancer (age of onset: 49) in her maternal grandmother; Breast cancer (age of onset: 26) in her mother; Diabetes in her father; Hyperlipidemia in her father; Hypertension in her mother; Hypothyroidism in her brother, mother, and sister; Ovarian cancer in her maternal aunt; Pancreatic cancer (age of onset: 31) in her father; Parkinson's disease in her maternal uncle; Stroke in her maternal uncle.  SOCIAL HISTORY:  reports that she quit smoking about 4 years ago. Her smoking use included cigarettes. She smoked 0.50 packs per day. she has never used smokeless tobacco. She reports that she drinks alcohol. She reports that she does not use drugs.  ALLERGIES: Levaquin [levofloxacin in d5w]; Ppa-mma express [compleat]; Zithromax [azithromycin];  and Ultram [tramadol hcl]  MEDICATIONS:  Current Outpatient Medications  Medication Sig Dispense Refill  . clonazePAM (KLONOPIN) 1 MG tablet TAKE 4 TABLETS BY MOUTH AT BEDTIME 360 tablet 1  .  cyclobenzaprine (FLEXERIL) 5 MG tablet TAKE 1 TO 2 TABLETS AT BEDTIME 60 tablet 11  . escitalopram (LEXAPRO) 10 MG tablet Take 1 tablet (10 mg total) by mouth daily. (Patient not taking: Reported on 03/10/2017) 30 tablet 11  . tiZANidine (ZANAFLEX) 4 MG tablet TAKE 2 TABLETS (8 MG TOTAL) BY MOUTH AT BEDTIME. 180 tablet 3   No current facility-administered medications for this encounter.     REVIEW OF SYSTEMS: A 10+ POINT REVIEW OF SYSTEMS WAS OBTAINED including neurology, dermatology, psychiatry, cardiac, respiratory, lymph, extremities, GI, GU, Musculoskeletal, constitutional, breasts, reproductive, HEENT.  All pertinent positives are noted in the HPI.  All others are negative.   PHYSICAL EXAM:   Vitals with BMI 03/10/2017  Height '5\' 4"'$   Weight 168 lbs 10 oz  BMI 50.53  Systolic 976  Diastolic 89  Pulse 66  Respirations 17  General: Alert and oriented, in no acute distress. Patient ambulates with a walker. She cannot ambulate on her own without this HEENT: Head is normocephalic. Extraocular movements are intact. Oropharynx is clear. Neck: Neck is supple, no palpable cervical or supraclavicular lymphadenopathy. Heart: Regular in rate and rhythm with no murmurs, rubs, or gallops. Chest: Clear to auscultation bilaterally, with no rhonchi, wheezes, or rales. Abdomen: Soft, nontender, nondistended, with no rigidity or guarding. Extremities: No cyanosis or edema in upper extremities. Edema in bilateral ankles  Lymphatics: see Neck Exam Skin: No concerning lesions.  Eczematous rash over bilateral legs.  Musculoskeletal: difficult to step up to exam table, ultimately she opted to be examined in chair Neurologic: Cranial nerves II through XII are grossly intact. No obvious focalities. Speech is fluent. Coordination is intact. Ambulation  restricted, needs a walker. Psychiatric: Judgment and insight are intact. Affect is appropriate. Breasts: Bruising noted in the UOQ of the left breast. A 1 cm  mass palpated at the biopsy site in the 2 o'clock position of the left breast. No other palpable masses appreciated in either breasts or axillae.   ECOG = 3  0 - Asymptomatic (Fully active, able to carry on all predisease activities without restriction)  1 - Symptomatic but completely ambulatory (Restricted in physically strenuous activity but ambulatory and able to carry out work of a light or sedentary nature. For example, light housework, office work)  2 - Symptomatic, <50% in bed during the day (Ambulatory and capable of all self care but unable to carry out any work activities. Up and about more than 50% of waking hours)  3 - Symptomatic, >50% in bed, but not bedbound (Capable of only limited self-care, confined to bed or chair 50% or more of waking hours)  4 - Bedbound (Completely disabled. Cannot carry on any self-care. Totally confined to bed or chair)  5 - Death   Eustace Pen MM, Creech RH, Tormey DC, et al. [phone]). "Toxicity and response criteria of the [facility]". [place] Oncol. 5 (6): 649-55   LABORATORY DATA:  Lab Results  Component Value Date   WBC 6.1 03/10/2017   HGB 15.0 01/09/2015   HCT 44.9 03/10/2017   MCV 105.0 (H) 03/10/2017   PLT 197 03/10/2017   CMP     Component Value Date/Time   NA 143 03/10/2017 1229   K 4.2 03/10/2017 1229   CL 105 03/10/2017 1229  CO2 24 03/10/2017 1229   GLUCOSE 102 03/10/2017 1229   BUN 7 03/10/2017 1229   CREATININE 0.77 03/10/2017 1229   CALCIUM 9.6 03/10/2017 1229   PROT 7.5 03/10/2017 1229   ALBUMIN 3.9 03/10/2017 1229   AST 43 (H) 03/10/2017 1229   ALT 35 03/10/2017 1229   ALKPHOS 60 03/10/2017 1229   BILITOT 0.6 03/10/2017 1229   GFRNONAA >60 03/10/2017 1229   GFRAA >60 03/10/2017 1229         RADIOGRAPHY: Ct Chest Wo Contrast  Result Date: 02/19/2017 CLINICAL DATA:  Pulmonary nodules for 3 years. Former smoker x2 years. EXAM: CT CHEST WITHOUT CONTRAST TECHNIQUE: Multidetector CT imaging  of the chest was performed following the standard protocol without IV contrast. COMPARISON:  01/31/2016, 01/17/2015 FINDINGS: Cardiovascular: Heart size is normal. No pericardial effusion. There is atherosclerotic calcification of the coronary arteries. Normal noncontrast appearance of the pulmonary arteries and thoracic aorta. Mediastinum/Nodes: The visualized portion of the thyroid gland has a normal appearance. Normal appearance of the esophagus. No mediastinal, hilar, or axillary adenopathy. Lungs/Pleura: Again noted is biapical pleuroparenchymal scarring, right greater than left. A 3 millimeter nodule in the right middle lobe has been stable for more than 2 years. No new or suspicious nodules are identified. No new consolidations or pleural effusions. Upper Abdomen: No acute abnormality. Musculoskeletal: No suspicious bone lesions identified. Additional: There is a 6 millimeter nodule in the lateral portion of the left breast, just below the level of the nipple warranting further evaluation. IMPRESSION: 1. Stable appearance of 3 millimeter nodule within the right middle lobe. Given the stability, no additional follow-up is felt to be necessary. 2. The 6 millimeter mass within the lateral portion of the left breast warranting further evaluation. Diagnostic mammogram and left breast ultrasound recommended. 3. Coronary artery atherosclerosis. Electronically Signed   By: Nolon Nations M.D.   On: 02/19/2017 15:31   US Breast Ltd Uni Left Inc Axilla  Result Date: 03/04/2017 CLINICAL DATA:  Patient had a recent CT scan of the chest which showed an abnormality in the left breast. EXAM: DIGITAL DIAGNOSTIC LEFT MAMMOGRAM WITH CAD AND TOMO ULTRASOUND LEFT BREAST COMPARISON:  Previous exam(s). ACR Breast Density Category b: There are scattered areas of fibroglandular density. FINDINGS: In the middle third of the upper-outer quadrant of the left breast is a 4 mm spiculated mass. There are no malignant type  microcalcifications. Mammographic images were processed with CAD. On physical exam, I do not palpate a mass in the upper-outer quadrant of the left breast. Targeted ultrasound is performed, showing an irregular hypoechoic mass in the left breast at 2 o'clock 7 cm from the nipple measuring 3 x 4 x 4 mm. Sonographic evaluation of the left axilla does not show any enlarged adenopathy. IMPRESSION: Suspicious left breast mass. RECOMMENDATION: Ultrasound-guided core biopsy of the mass in the 2 o'clock region of the left breast is recommended. The biopsy will be scheduled the patient's convenience. I have discussed the findings and recommendations with the patient. Results were also provided in writing at the conclusion of the visit. If applicable, a reminder letter will be sent to the patient regarding the next appointment. BI-RADS CATEGORY  4: Suspicious. Electronically Signed   By: Lillia Mountain M.D.   On: 03/03/2017 12:18   Mm Diag Breast Tomo Uni Left  Result Date: 03/03/2017 CLINICAL DATA:  Patient had a recent CT scan of the chest which showed an abnormality in the left breast. EXAM: DIGITAL DIAGNOSTIC LEFT MAMMOGRAM WITH CAD  AND TOMO ULTRASOUND LEFT BREAST COMPARISON:  Previous exam(s). ACR Breast Density Category b: There are scattered areas of fibroglandular density. FINDINGS: In the middle third of the upper-outer quadrant of the left breast is a 4 mm spiculated mass. There are no malignant type microcalcifications. Mammographic images were processed with CAD. On physical exam, I do not palpate a mass in the upper-outer quadrant of the left breast. Targeted ultrasound is performed, showing an irregular hypoechoic mass in the left breast at 2 o'clock 7 cm from the nipple measuring 3 x 4 x 4 mm. Sonographic evaluation of the left axilla does not show any enlarged adenopathy. IMPRESSION: Suspicious left breast mass. RECOMMENDATION: Ultrasound-guided core biopsy of the mass in the 2 o'clock region of the left  breast is recommended. The biopsy will be scheduled the patient's convenience. I have discussed the findings and recommendations with the patient. Results were also provided in writing at the conclusion of the visit. If applicable, a reminder letter will be sent to the patient regarding the next appointment. BI-RADS CATEGORY  4: Suspicious. Electronically Signed   By: Lillia Mountain M.D.   On: 03/03/2017 12:18   Mm Clip Placement Left  Result Date: 03/04/2017 CLINICAL DATA:  Post ultrasound-guided biopsy of the mass in the left breast at the 2 o'clock position. EXAM: DIAGNOSTIC LEFT MAMMOGRAM POST ULTRASOUND BIOPSY COMPARISON:  Previous exam(s). FINDINGS: Mammographic images were obtained following ultrasound guided biopsy of a mass in the left breast at the 2 o'clock position. A ribbon shaped biopsy marking clip is present at the site of the biopsied mass in the left breast. IMPRESSION: Ribbon shaped biopsy marking clip at site of biopsied mass in the left breast at the 2 o'clock position. Final Assessment: Post Procedure Mammograms for Marker Placement Electronically Signed   By: Everlean Alstrom M.D.   On: 03/04/2017 16:47   [place] Lt Breast Bx W Loc Dev 1st Lesion Img Bx Spec US Guide  Addendum Date: 03/09/2017   ADDENDUM REPORT: 03/05/2017 14:46 ADDENDUM: Pathology revealed GRADE II INVASIVE DUCTAL CARCINOMA, DUCTAL CARCINOMA IN SITU WITH CALCIFICATIONS of the Left breast, 2 o'clock. This was found to be concordant by Dr. Everlean Alstrom. Pathology results were discussed with the patient by telephone. The patient reported doing well after the biopsy with tenderness at the site. Post biopsy instructions and care were reviewed and questions were answered. The patient was encouraged to call [facility] for any additional concerns. The patient was referred to [facility] on March 10, 2017. Pathology results  reported by Terie Purser, RN on 03/05/2017. Electronically Signed   By: Everlean Alstrom M.D.   On: 03/05/2017 14:46   Result Date: 03/09/2017 CLINICAL DATA:  56 year old female with a suspicious left breast mass. EXAM: ULTRASOUND GUIDED LEFT BREAST CORE NEEDLE BIOPSY COMPARISON:  Previous exam(s). FINDINGS: I met with the patient and we discussed the procedure of ultrasound-guided biopsy, including benefits and alternatives. We discussed the high likelihood of a successful procedure. We discussed the risks of the procedure, including infection, bleeding, tissue injury, clip migration, and inadequate sampling. Informed written consent was given. The usual time-out protocol was performed immediately prior to the procedure. Lesion quadrant: Upper-outer Using sterile technique and 1% Lidocaine as local anesthetic, under direct ultrasound visualization, a 12 gauge spring-loaded device was used to perform biopsy of the mass in the upper-outer left breast at the 2 o'clock position using a lateral to medial  approach. At the conclusion of the procedure a ribbon shaped tissue marker clip was deployed into the biopsy cavity. Follow up 2 view mammogram was performed and dictated separately. IMPRESSION: Ultrasound guided biopsy of the mass in the left breast at the 2 o'clock position. No apparent complications. Electronically Signed: By: Everlean Alstrom M.D. On: 03/04/2017 16:35      IMPRESSION/PLAN: Stage clinical 1a (T1a N0 M0) Left Breast UOQ Invasive Ductal Carcinoma with DCIS, ER+ / PR+ / Her2+, Grade II. The patient will proceed with left lumpectomy and sentinel lymph node biopsy. Chemotherapy decision will be determined by final size. This will be followed by radiation therapy and an antiestrogen pill.  She has been discussed at our multidisciplinary tumor board.  The consensus is that she would be a good candidate for breast conservation. I talked to her about the option of a mastectomy and informed her that her  expected overall survival would be equivalent between mastectomy and breast conservation, based upon randomized controlled data. She is enthusiastic about breast conservation.  Genetics referral to be made.  It was a pleasure meeting the patient today. We discussed the risks, benefits, and side effects of radiotherapy. I recommend radiotherapy to the left breast to reduce her risk of locoregional recurrence by 2/3.  We discussed that radiation would take approximately 4 weeks to complete and that I would give the patient a few weeks to heal following surgery before starting treatment planning. If chemotherapy were to be given, this would precede radiotherapy. We spoke about acute effects including skin irritation and fatigue as well as much less common late effects including internal organ injury or irritation. We spoke about the latest technology that is used to minimize the risk of late effects for patients undergoing radiotherapy to the breast or chest wall. No guarantees of treatment were given. The patient is enthusiastic about proceeding with treatment. I look forward to participating in the patient's care.  I will await her referral back to me for postoperative follow-up and eventual CT simulation/treatment planning.  __________________________________________   Eppie Gibson, MD   This document serves as a record of services personally performed by Eppie Gibson, MD. It was created on her behalf by Bethann Humble, a trained medical scribe. The creation of this record is based on the scribe's personal observations and the provider's statements to them. This document has been checked and approved by the attending provider.

## 2017-03-10 NOTE — Patient Instructions (Signed)

## 2017-03-10 NOTE — Assessment & Plan Note (Signed)
03/04/2017: Screening detected 4 mm spiculated mass: left breast biopsy 2 o'clock position: IDC grade 2 With DCIS ER 100%, PR 20%, Ki-67 10%, HER-2 positive ratio 2.96, T1 a N0 stage I a  Pathology and radiology counseling: Discussed with the patient, the details of pathology including the type of breast cancer,the clinical staging, the significance of ER, PR and HER-2/neu receptors and the implications for treatment. After reviewing the pathology in detail, we proceeded to discuss the different treatment options between surgery, radiation, chemotherapy, antiestrogen therapies.  Recommendation: 1. Breast conserving surgery 2. adjuvant systemic chemotherapy would be necessary if the final tumor size is greater than 6 mm 3.  Adjuvant radiation therapy 4. Followed by adjuvant antiestrogen therapy   return to clinic after surgery to discuss the final pathology report and to finalize the adjuvant treatment plan.

## 2017-03-10 NOTE — Progress Notes (Signed)
Nutrition Assessment  Reason for Assessment:  Pt seen in Breast Clinic  ASSESSMENT:   57 year old female with new diagnosis of breast cancer.  Past medical history of MS.  Patient reports good appetite  Medications:  reviewed  Labs: reviewed  Anthropometrics:   Height: 64 inches Weight: 168 lb 9.6 oz BMI: 29   NUTRITION DIAGNOSIS: Food and nutrition related knowledge deficit related to new diagnosis of breast cancer as evidenced by no prior need for nutrition related information.  INTERVENTION:   Discussed and provided packet of information regarding nutritional tips for breast cancer patients.  Questions answered.  Teachback method used.  Contact information provided and patient knows to contact me with questions/concerns.    MONITORING, EVALUATION, and GOAL: Pt will consume a healthy plant based diet to maintain lean body mass throughout treatment.   Taylor Hancock B. Zenia Resides, [place], [place] Registered Dietitian [phone] (pager)

## 2017-03-10 NOTE — Progress Notes (Signed)
[facility] CONSULT NOTE  Patient Care Team: Shirline Frees, MD as PCP - General (Family Medicine)  CHIEF COMPLAINTS/PURPOSE OF CONSULTATION:  Newly diagnosed breast cancer  HISTORY OF PRESENTING ILLNESS:  Taylor Hancock 57 y.o. female is here because of recent diagnosis of left breast cancer.  Patient had a routine screening mammogram that detected the middle third of the upper outer quadrant of the left breast a 4 mm spiculated mass.  This was detected primarily because she had a CT scan for her chest which incidentally found this mass.  She has a history of multiple sclerosis primary progressive type.  She is here today accompanied by her husband to discuss her treatment plan. She was discussed this morning in the multidisciplinary tumor board and she is here today to talk about  treatment options.  I reviewed her records extensively and collaborated the history with the patient.  SUMMARY OF ONCOLOGIC HISTORY:   Malignant neoplasm of upper-outer quadrant of left breast in female, estrogen receptor positive ([place])   03/04/2017 Initial Diagnosis    Screening detected 4 mm spiculated mass: left breast biopsy 2 o'clock position: IDC grade 2 With DCIS ER 100%, PR 20%, Ki-67 10%, HER-2 positive ratio 2.96, T1 a N0 stage I a      MEDICAL HISTORY:  Past Medical History:  Diagnosis Date  . Anxiety   . Concussion   . Depression   . Family history of breast cancer   . Family history of ovarian cancer   . Family history of pancreatic cancer   . Movement disorder   . MS (multiple sclerosis) ([place])   . Multiple sclerosis ([place])   . Neuropathy   . Vision abnormalities     SURGICAL HISTORY: Past Surgical History:  Procedure Laterality Date  . FRACTURE SURGERY Left   . I&D EXTREMITY  05/30/2011   Procedure: IRRIGATION AND DEBRIDEMENT EXTREMITY;  Surgeon: Tennis Must, MD;  Location: [facility];  Service: Orthopedics;;  Incision and drainage of open Proximal interphalangeal joint  with closed reduction of Proximal interphalangeal  joint left long finger  . KNEE ARTHROSCOPY      SOCIAL HISTORY: Social History   Socioeconomic History  . Marital status: Married    Spouse name: Not on file  . Number of children: Not on file  . Years of education: Not on file  . Highest education level: Not on file  Social Needs  . Financial resource strain: Not on file  . Food insecurity - worry: Not on file  . Food insecurity - inability: Not on file  . Transportation needs - medical: Not on file  . Transportation needs - non-medical: Not on file  Occupational History  . Not on file  Tobacco Use  . Smoking status: Former Smoker    Packs/day: 0.50    Types: Cigarettes    Last attempt to quit: 11/25/2012    Years since quitting: 4.2  . Smokeless tobacco: Never Used  Substance and Sexual Activity  . Alcohol use: Yes    Alcohol/week: 0.0 oz    Comment: 2 daily  . Drug use: No  . Sexual activity: Not on file  Other Topics Concern  . Not on file  Social History Narrative  . Not on file    FAMILY HISTORY: Family History  Problem Relation Age of Onset  . Hypertension Mother   . Breast cancer Mother 59  . Hypothyroidism Mother   . Hyperlipidemia Father   . Diabetes Father   .  Pancreatic cancer Father 74  . Hypothyroidism Sister   . Ovarian cancer Maternal Aunt        dx in her 24s  . Parkinson's disease Maternal Uncle   . Stroke Maternal Uncle   . Breast cancer Maternal Grandmother 100  . Breast cancer Maternal Aunt        dx under 19  . Breast cancer Maternal Aunt        dx over 38s  . Breast cancer Maternal Aunt        dx >50  . Breast cancer Other   . Hypothyroidism Brother   . Breast cancer Cousin   . Breast cancer Sister     ALLERGIES:  is allergic to levaquin [levofloxacin in d5w]; ppa-mma express [compleat]; zithromax [azithromycin]; and ultram [tramadol hcl].  MEDICATIONS:  Current Outpatient Medications  Medication Sig Dispense Refill  .  clonazePAM (KLONOPIN) 1 MG tablet TAKE 4 TABLETS BY MOUTH AT BEDTIME 360 tablet 1  . cyclobenzaprine (FLEXERIL) 5 MG tablet TAKE 1 TO 2 TABLETS AT BEDTIME 60 tablet 11  . tiZANidine (ZANAFLEX) 4 MG tablet TAKE 2 TABLETS (8 MG TOTAL) BY MOUTH AT BEDTIME. 180 tablet 3  . escitalopram (LEXAPRO) 10 MG tablet Take 1 tablet (10 mg total) by mouth daily. (Patient not taking: Reported on 03/10/2017) 30 tablet 11   No current facility-administered medications for this visit.     REVIEW OF SYSTEMS:   Constitutional: Denies fevers, chills or abnormal night sweats Eyes: Denies blurriness of vision, double vision or watery eyes Ears, nose, mouth, throat, and face: Denies mucositis or sore throat Respiratory: Denies cough, dyspnea or wheezes Cardiovascular: Denies palpitation, chest discomfort or lower extremity swelling Gastrointestinal:  Denies nausea, heartburn or change in bowel habits Skin: Denies abnormal skin rashes Lymphatics: Denies new lymphadenopathy or easy bruising Neurological:Denies numbness, tingling or new weaknesses Behavioral/Psych: Mood is stable, no new changes  Breast: Denies any palpable lumps or discharge All other systems were reviewed with the patient and are negative.  PHYSICAL EXAMINATION: ECOG PERFORMANCE STATUS: 1  Vitals:   03/10/17 1300  BP: (!) 151/89  Pulse: 66  Resp: 17  Temp: 97.9 F (36.6 C)  SpO2: 100%   Filed Weights   03/10/17 1300  Weight: 168 lb 9.6 oz (76.5 kg)    GENERAL:alert, no distress and comfortable SKIN: skin color, texture, turgor are normal, no rashes or significant lesions EYES: normal, conjunctiva are pink and non-injected, sclera clear OROPHARYNX:no exudate, no erythema and lips, buccal mucosa, and tongue normal  NECK: supple, thyroid normal size, non-tender, without nodularity LYMPH:  no palpable lymphadenopathy in the cervical, axillary or inguinal LUNGS: clear to auscultation and percussion with normal breathing effort HEART:  regular rate & rhythm and no murmurs and no lower extremity edema ABDOMEN:abdomen soft, non-tender and normal bowel sounds Musculoskeletal:no cyanosis of digits and no clubbing  PSYCH: alert & oriented x 3 with fluent speech NEURO: no focal motor/sensory deficits BREAST: No palpable nodules in breast. No palpable axillary or supraclavicular lymphadenopathy (exam performed in the presence of a chaperone)   LABORATORY DATA:  I have reviewed the data as listed Lab Results  Component Value Date   WBC 6.1 03/10/2017   HGB 15.0 01/09/2015   HCT 44.9 03/10/2017   MCV 105.0 (H) 03/10/2017   PLT 197 03/10/2017   Lab Results  Component Value Date   NA 143 03/10/2017   K 4.2 03/10/2017   CL 105 03/10/2017   CO2 24 03/10/2017  RADIOGRAPHIC STUDIES: I have personally reviewed the radiological reports and agreed with the findings in the report.  ASSESSMENT AND PLAN:  Malignant neoplasm of upper-outer quadrant of left breast in female, estrogen receptor positive ([place]) 03/04/2017: Screening detected 4 mm spiculated mass: left breast biopsy 2 o'clock position: IDC grade 2 With DCIS ER 100%, PR 20%, Ki-67 10%, HER-2 positive ratio 2.96, T1 a N0 stage I a  Pathology and radiology counseling: Discussed with the patient, the details of pathology including the type of breast cancer,the clinical staging, the significance of ER, PR and HER-2/neu receptors and the implications for treatment. After reviewing the pathology in detail, we proceeded to discuss the different treatment options between surgery, radiation, chemotherapy, antiestrogen therapies.  Recommendation: 1. Breast conserving surgery 2. adjuvant systemic chemotherapy would be necessary if the final tumor size is greater than 6 mm 3.  Adjuvant radiation therapy 4. Followed by adjuvant antiestrogen therapy   return to clinic after surgery to discuss the final pathology report and to finalize the adjuvant treatment plan.  All questions  were answered. The patient knows to call the clinic with any problems, questions or concerns.    Harriette Ohara, MD 03/10/17

## 2017-03-11 ENCOUNTER — Encounter: Payer: Self-pay | Admitting: General Practice

## 2017-03-11 ENCOUNTER — Encounter: Payer: Self-pay | Admitting: Physical Therapy

## 2017-03-11 NOTE — Therapy (Signed)
[place], [place], [address] Phone: [phone]   Fax:  [phone]  Physical Therapy Evaluation  Patient Details  Name: Taylor Hancock MRN: [id number] Date of Birth: 02-05-1960 Referring Provider: Dr. Stark Klein   Encounter Date: 03/10/2017  PT End of Session - 03/10/17 1402    Visit Number  1    Number of Visits  2    Date for PT Re-Evaluation  05/05/17    PT Start Time  1340    PT Stop Time  1356 Also saw pt from 1530 to 1547 for a total of 33 minutes    PT Time Calculation (min)  16 min    Activity Tolerance  Patient tolerated treatment well    Behavior During Therapy  [facility] for tasks assessed/performed       Past Medical History:  Diagnosis Date  . Anxiety   . Concussion   . Depression   . Family history of breast cancer   . Family history of ovarian cancer   . Family history of pancreatic cancer   . Movement disorder   . MS (multiple sclerosis) ([place])   . Multiple sclerosis ([place])   . Neuropathy   . Vision abnormalities     Past Surgical History:  Procedure Laterality Date  . FRACTURE SURGERY Left   . I&D EXTREMITY  05/30/2011   Procedure: IRRIGATION AND DEBRIDEMENT EXTREMITY;  Surgeon: Tennis Must, MD;  Location: [facility];  Service: Orthopedics;;  Incision and drainage of open Proximal interphalangeal joint with closed reduction of Proximal interphalangeal  joint left long finger  . KNEE ARTHROSCOPY      There were no vitals filed for this visit.   Subjective Assessment - 03/10/17 1353    Subjective  Patient reports she is here today to be seen by her medical team for her newly diagnosed left breast cancer.    Patient is accompained by:  Family member    Pertinent History  Patient was diagnosed on 02/19/17 with left triple positive grade 2 invasive ductal carcinoma breast cancer. It measures 4 mm and is located in the upper outer quadrant with a Ki67 of 10%. She also has a history  of osteoporosis and has progressive multiple sclerosis and uses a rolling walker to ambulate. She also has fibromyalgia. She reports a history of multiple concussions due to falls.    Patient Stated Goals  Reduce lymphedema risk and learn post op shoulder ROM HEP    Currently in Pain?  Yes    Pain Score  -- Pain throughout her body due to fibromylagia and MS         [facility] PT Assessment - 03/10/17 0001      Assessment   Medical Diagnosis  Left breast cancer    Referring Provider  Dr. Stark Klein    Onset Date/Surgical Date  02/19/17    Hand Dominance  Right    Prior Therapy  none related to breast cancer      Precautions   Precautions  Other (comment)    Precaution Comments  MS; active cancer; fall risk      Restrictions   Weight Bearing Restrictions  No      Balance Screen   Has the patient fallen in the past 6 months  Yes    How many times?  -- Reports having over 50 falls and many over the past 6 months    Has the patient had a decrease in activity  level because of a fear of falling?   Yes Reports multiple concussions as result of falls    Is the patient reluctant to leave their home because of a fear of falling?   Yes      [place] residence    Living Arrangements  Spouse/significant other    Available Help at Discharge  Family      Prior Function   Level of Independence  Independent with household mobility with device;Independent with community mobility with device High fall risk even with walker    Vocation  On disability    Leisure  Some some stretches and elliptical when able      Cognition   Overall Cognitive Status  Difficult to assess    Difficult to assess due to  -- Unable to formally assess in this setting      Posture/Postural Control   Posture/Postural Control  Postural limitations    Postural Limitations  Rounded Shoulders;Forward head      ROM / Strength   AROM / PROM / Strength  AROM      AROM   AROM  Assessment Site  Shoulder;Cervical    Right/Left Shoulder  Right;Left    Right Shoulder Extension  44 Degrees    Right Shoulder Flexion  148 Degrees    Right Shoulder ABduction  155 Degrees    Right Shoulder Internal Rotation  68 Degrees    Right Shoulder External Rotation  82 Degrees    Left Shoulder Extension  47 Degrees    Left Shoulder Flexion  141 Degrees    Left Shoulder ABduction  155 Degrees    Left Shoulder Internal Rotation  72 Degrees    Left Shoulder External Rotation  85 Degrees    Cervical Flexion  WNL    Cervical Extension  25% limited and c/o dizziness    Cervical - Right Side Bend  25% limited    Cervical - Left Side Bend  25% limited    Cervical - Right Rotation  WNL    Cervical - Left Rotation  WNL        LYMPHEDEMA/ONCOLOGY QUESTIONNAIRE - 03/10/17 1401      Type   Cancer Type  Left breast cancer      Lymphedema Assessments   Lymphedema Assessments  Upper extremities      Right Upper Extremity Lymphedema   10 cm Proximal to Olecranon Process  28.9 cm    Olecranon Process  26.3 cm    10 cm Proximal to Ulnar Styloid Process  22.4 cm    Just Proximal to Ulnar Styloid Process  15.5 cm    Across Hand at [employer]  17.5 cm    At [place] of 2nd Digit  6 cm      Left Upper Extremity Lymphedema   10 cm Proximal to Olecranon Process  29.5 cm    Olecranon Process  25.8 cm    10 cm Proximal to Ulnar Styloid Process  22.3 cm    Just Proximal to Ulnar Styloid Process  15.1 cm    Across Hand at [employer]  17.8 cm    At [place] of 2nd Digit  6.1 cm          Objective measurements completed on examination: See above findings.    Patient was instructed today in a home exercise program today for post op shoulder range of motion. These included active assist shoulder flexion in sitting, scapular  retraction, wall walking with shoulder abduction, and hands behind head external rotation.  She was encouraged to do these twice a day, holding 3 seconds and  repeating 5 times when permitted by her physician.     PT Education - 03/10/17 1401    Education provided  Yes    Education Details  Lymphedema risk reduction and post op shoulder ROM HEP    Person(s) Educated  Patient;Spouse    Methods  Explanation;Demonstration;Handout    Comprehension  Returned demonstration;Verbalized understanding          PT Long Term Goals - 03/10/17 1409      PT LONG TERM GOAL #1   Title  Patient demonstrates she has returned to baseline related to shoulder ROM and function.    Time  [facility] Goals - 03/10/17 1409      Patient will be able to verbalize understanding of pertinent lymphedema risk reduction practices relevant to her diagnosis specifically related to skin care.   Time  1    Period  Days    Status  Achieved      Patient will be able to return demonstrate and/or verbalize understanding of the post-op home exercise program related to regaining shoulder range of motion.   Time  1    Period  Days    Status  Achieved      Patient will be able to verbalize understanding of the importance of attending the postoperative After Breast Cancer Class for further lymphedema risk reduction education and therapeutic exercise.   Time  1    Period  Days    Status  Achieved            Plan - 03/10/17 1402    Clinical Impression Statement  Patient was diagnosed on 02/19/17 with left triple positive grade 2 invasive ductal carcinoma breast cancer. It measures 4 mm and is located in the upper outer quadrant with a Ki67 of 10%. She also has a history of osteoporosis and has progressive multiple sclerosis and uses a rolling walker to ambulate. She also has fibromyalgia. Her multidisciplinary medical team met prior to her assessments to determine a recommended treatment plan. She is planning to have a left lumpectomy with a sentinel node biopsy followed by radiation and anti-estrogen therapy. She will benefit  from a PT reassessment post operatively to determine PT needs. Physical therapy for balance and to reduce falls was initially recommended but patient reported she underwent extensive therapy last year at [facility] for those deficits and was discharged in 12/18.    History and Personal Factors relevant to plan of care:  Multiple sclerosis, fibromyalgia, multiple falls and concussions, osteoporosis    Clinical Presentation  Unstable    Clinical Presentation due to:  Patient's medical status appears unstable as she has many comorbidities and appears unsafe with ambulation. It is difficult to assess the effect of MS on other comorbidities. It is unclear how she will tolerate cancer treatment due to complicated medical history.    Clinical Decision Making  Moderate    Rehab Potential  Good    Clinical Impairments Affecting Rehab Potential  Above stated comorbidities    PT Frequency  -- Eval and 1 f/u visit    PT Treatment/Interventions  ADLs/Self Care Home Management;Therapeutic exercise;Patient/family education    PT Next Visit Plan  Will reassess 3-4 weeks post op to determine PT needs  PT Home Exercise Plan  post op shoulder ROM HEP    Consulted and Agree with Plan of Care  Patient;Family member/caregiver    Family Member Consulted  Husband       Patient will benefit from skilled therapeutic intervention in order to improve the following deficits and impairments:  Decreased knowledge of precautions, Impaired UE functional use, Decreased range of motion, Decreased strength, Postural dysfunction, Pain  Visit Diagnosis: Malignant neoplasm of upper-outer quadrant of left breast in female, estrogen receptor positive ([place]) - Plan: PT plan of care cert/re-cert  Abnormal posture - Plan: PT plan of care cert/re-cert  Muscle weakness (generalized) - Plan: PT plan of care cert/re-cert   Patient will follow up at outpatient cancer rehab 3-4 weeks following surgery.  If the patient requires physical  therapy at that time, a specific plan will be dictated and sent to the referring physician for approval. The patient was educated today on appropriate basic range of motion exercises to begin post operatively and the importance of attending the After Breast Cancer class following surgery.  Patient was educated today on lymphedema risk reduction practices as it pertains to recommendations that will benefit the patient immediately following surgery.  She verbalized good understanding.      Problem List Patient Active Problem List   Diagnosis Date Noted  . Malignant neoplasm of upper-outer quadrant of left breast in female, estrogen receptor positive ([place]) 03/09/2017  . Urinary hesitancy 01/27/2017  . Mild cognitive disorder 06/09/2016  . Genetic testing 12/17/2015  . Family history of breast cancer   . Family history of ovarian cancer   . Family history of pancreatic cancer   . Insomnia 06/10/2015  . Urinary frequency 06/10/2015  . Frequent falls 12/26/2014  . Snoring 12/26/2014  . Depression 08/01/2014  . Other fatigue 08/01/2014  . Multiple sclerosis, primary progressive ([place]) 02/07/2014  . Spastic gait 02/07/2014  . 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 Numbness 02/07/2014  . Foot drop, left 02/07/2014    Annia Friendly, PT 03/11/17 7:50 AM  [place], [place], [address] Phone: [phone]   Fax:  [phone]  Name: Taylor Hancock MRN: [id number] Date of Birth: [birth year]

## 2017-03-11 NOTE — Progress Notes (Signed)
[place] Psychosocial Distress Screening [place] by phone following [facility] to introduce [place] team/resources, reviewing distress screen per protocol.  The patient scored a [unspecified] on the Psychosocial Distress Thermometer which indicates [unspecified] distress. Also assessed for distress and other psychosocial needs.   ONCBCN DISTRESS SCREENING 03/11/2017  Screening Type Initial Screening  Practical problem type Transportation  Emotional problem type Nervousness/Anxiety;Depression;Adjusting to illness  Information Concerns Type Lack of info about diagnosis;Lack of info about treatment  Physical Problem type Getting around;Other (comment)  Referral to support programs Yes   Taylor Hancock is loquacious and enjoyed this 30-min f/u call. She cites her faith as a Land of coping and meaning-making. Adjusting to MS has given her a number of transferrable skills for coping with anxiety--but this new dx is also an additional stressor on top of many others.   Follow up needed: No. Taylor Hancock is aware of ongoing Support Team availability, but please also page as needs arise or circumstances change. Thank you.   [place], [place], [facility] Pager [phone] Voicemail [phone]

## 2017-03-16 ENCOUNTER — Telehealth: Payer: Self-pay | Admitting: *Deleted

## 2017-03-16 NOTE — Telephone Encounter (Signed)
  Oncology Nurse Navigator Documentation  Navigator Location: [facility] (03/16/17 1400)   )Navigator Encounter Type: Telephone;MDC Follow-up (03/16/17 1400) Telephone: Outgoing Call;Clinic/MDC Follow-up (03/16/17 1400)                                                  Time Spent with Patient: 15 (03/16/17 1400)

## 2017-03-17 ENCOUNTER — Other Ambulatory Visit: Payer: Self-pay | Admitting: General Surgery

## 2017-03-17 DIAGNOSIS — C50412 Malignant neoplasm of upper-outer quadrant of left female breast: Secondary | ICD-10-CM

## 2017-03-17 DIAGNOSIS — Z170 Estrogen receptor positive status [ER+]: Principal | ICD-10-CM

## 2017-03-18 DIAGNOSIS — N6489 Other specified disorders of breast: Secondary | ICD-10-CM | POA: Diagnosis not present

## 2017-03-19 ENCOUNTER — Telehealth: Payer: Self-pay | Admitting: Hematology and Oncology

## 2017-03-19 NOTE — Telephone Encounter (Signed)
Mailed patient calendar of upcoming [facility] appointments per 3/1 sch message

## 2017-03-22 NOTE — Pre-Procedure Instructions (Addendum)
Taylor Hancock  03/22/2017      [id number] - [place], [place] - [address] AT [address] [address] Phone: [phone] Fax: [phone]    Your procedure is scheduled on March 31, 2017.  Report to [facility] Admitting at 630 AM.  Call this number if you have problems the morning of surgery:  [phone]   Remember:  Do not eat food or drink liquids after midnight.  Take these medicines the morning of surgery with A SIP OF WATER flexeril-if needed.  Please finish your Ensure presurgery drink before leaving your house the morning of surgery.  Beginning now, STOP taking any Aspirin (unless otherwise instructed by your surgeon), Aleve, Naproxen, Ibuprofen, Motrin, Advil, Goody's, BC's, all herbal medications, fish oil, and all vitamins  Continue all other medications as instructed by your physician except follow the above medication instructions before surgery   Do not wear jewelry, make-up or nail polish.  Do not wear lotions, powders, or perfumes, or deodorant.  Do not shave 48 hours prior to surgery.   Do not bring valuables to the hospital.  [facility] is not responsible for any belongings or valuables.  Contacts, dentures or bridgework may not be worn into surgery.  Leave your suitcase in the car.  After surgery it may be brought to your room.  For patients admitted to the hospital, discharge time will be determined by your treatment team.  Patients discharged the day of surgery will not be allowed to drive home.   Special instructions:  Taylor Hancock- Preparing For Surgery  Before surgery, you can play an important role. Because skin is not sterile, your skin needs to be as free of germs as possible. You can reduce the number of germs on your skin by washing with CHG (chlorahexidine gluconate) Soap before surgery.  CHG is an antiseptic cleaner which kills germs and bonds with the skin to  continue killing germs even after washing.  Please do not use if you have an allergy to CHG or antibacterial soaps. If your skin becomes reddened/irritated stop using the CHG.  Do not shave (including legs and underarms) for at least 48 hours prior to first CHG shower. It is OK to shave your face.  Please follow these instructions carefully.   1. Shower the NIGHT BEFORE SURGERY and the MORNING OF SURGERY with CHG.   2. If you chose to wash your hair, wash your hair first as usual with your normal shampoo.  3. After you shampoo, rinse your hair and body thoroughly to remove the shampoo.  4. Use CHG as you would any other liquid soap. You can apply CHG directly to the skin and wash gently with a scrungie or a clean washcloth.   5. Apply the CHG Soap to your body ONLY FROM THE NECK DOWN.  Do not use on open wounds or open sores. Avoid contact with your eyes, ears, mouth and genitals (private parts). Wash Face and genitals (private parts)  with your normal soap.  6. Wash thoroughly, paying special attention to the area where your surgery will be performed.  7. Thoroughly rinse your body with warm water from the neck down.  8. DO NOT shower/wash with your normal soap after using and rinsing off the CHG Soap.  9. Pat yourself dry with a CLEAN TOWEL.  10. Wear CLEAN PAJAMAS to bed the night before surgery, wear comfortable clothes the morning of  surgery  11. Place CLEAN SHEETS on your bed the night of your first shower and DO NOT SLEEP WITH PETS.  Day of Surgery: Do not apply any deodorants/lotions. Please wear clean clothes to the hospital/surgery center.    Please read over the following fact sheets that you were given. Pain Booklet, Coughing and Deep Breathing and Surgical Site Infection Prevention

## 2017-03-23 ENCOUNTER — Telehealth: Payer: Self-pay | Admitting: Neurology

## 2017-03-23 ENCOUNTER — Encounter (HOSPITAL_COMMUNITY): Payer: Self-pay

## 2017-03-23 ENCOUNTER — Other Ambulatory Visit: Payer: Self-pay

## 2017-03-23 ENCOUNTER — Encounter (HOSPITAL_COMMUNITY)
Admission: RE | Admit: 2017-03-23 | Discharge: 2017-03-23 | Disposition: A | Discharge status: Home / Self Care | Payer: 59 | Source: Ambulatory Visit | Attending: General Surgery | Admitting: General Surgery

## 2017-03-23 DIAGNOSIS — Z01818 Encounter for other preprocedural examination: Secondary | ICD-10-CM | POA: Insufficient documentation

## 2017-03-23 DIAGNOSIS — C50912 Malignant neoplasm of unspecified site of left female breast: Secondary | ICD-10-CM | POA: Insufficient documentation

## 2017-03-23 HISTORY — DX: Other complications of anesthesia, initial encounter: T88.59XA

## 2017-03-23 HISTORY — DX: Fibromyalgia: M79.7

## 2017-03-23 HISTORY — DX: Adverse effect of unspecified anesthetic, initial encounter: T41.45XA

## 2017-03-23 LAB — CBC
HEMATOCRIT: 44.6 % (ref 36.0–46.0)
Hemoglobin: 15.9 g/dL — ABNORMAL HIGH (ref 12.0–15.0)
MCH: 36.9 pg — AB (ref 26.0–34.0)
MCHC: 35.7 g/dL (ref 30.0–36.0)
MCV: 103.5 fL — AB (ref 78.0–100.0)
PLATELETS: 287 10*3/uL (ref 150–400)
RBC: 4.31 MIL/uL (ref 3.87–5.11)
RDW: 12.7 % (ref 11.5–15.5)
WBC: 8.3 10*3/uL (ref 4.0–10.5)

## 2017-03-23 LAB — COMPREHENSIVE METABOLIC PANEL
ALT: 44 U/L (ref 14–54)
AST: 58 U/L — AB (ref 15–41)
Albumin: 3.9 g/dL (ref 3.5–5.0)
Alkaline Phosphatase: 53 U/L (ref 38–126)
Anion gap: 13 (ref 5–15)
BUN: 11 mg/dL (ref 6–20)
CHLORIDE: 101 mmol/L (ref 101–111)
CO2: 24 mmol/L (ref 22–32)
CREATININE: 0.79 mg/dL (ref 0.44–1.00)
Calcium: 9.6 mg/dL (ref 8.9–10.3)
GFR calc Af Amer: >60 mL/min (ref >60)
Glucose, Bld: 191 mg/dL — ABNORMAL HIGH (ref 65–99)
Potassium: 4.7 mmol/L (ref 3.5–5.1)
Sodium: 138 mmol/L (ref 135–145)
TOTAL PROTEIN: 7.3 g/dL (ref 6.5–8.1)
Total Bilirubin: 1.4 mg/dL — ABNORMAL HIGH (ref 0.3–1.2)

## 2017-03-23 MED ORDER — CHLORHEXIDINE GLUCONATE CLOTH 2 % EX PADS: 6.0000 | MEDICATED_PAD | Freq: Once | CUTANEOUS | Status: DC

## 2017-03-23 NOTE — Progress Notes (Addendum)
PCP: Shirline Frees, MD  Cardiologist: pt denies  Neurologist: Dr. Arlice Colt  EKG: pt denies past year,  Stress test: pt denies  ECHO: pt denies but is unsure what Dr. Kenton Kingfisher has done-requested records from his office   Cardiac Cath: pt denies ever  Chest x-ray: no x-ray, chest CT in Epic 02/19/17

## 2017-03-23 NOTE — Telephone Encounter (Signed)
Patient calling to advise she has breast cancer. A lump was found on a CT scan of her lung and had a biopsy which was positive.She will have surgery on 03-31-17 at [facility] A returned call is not needed unless there are questions.

## 2017-03-24 NOTE — Progress Notes (Signed)
Anesthesia Chart Review: Patient is a 57 year old female scheduled for left seed localized lumpectomy and sentinel lymph node biopsy on 03/31/17 by Dr. Stark Klein. Case is posted for general anesthesia with pectoral block. It appears that seed implant is scheduled for 03/29/17 at 2:00 PM.  History includes former smoker (quit '14), primary progressive Multiple Sclerosis (PPMS), gait disorder (leg weakness, gait is spastic, multiple falls), neuropathy, vision abnormalities, concussion, anxiety, depression, fibromyalgia. She has a RML lung nodule that was followed with serial imaging, but given stability no additional follow-up recommended per 02/19/17 chest CT. Of note, coronary calcifications were noted as well as a left breast nodule (which lead to recent left breast cancer diagnosis). Reported she was "awake" during colonoscopy--didn't feels drugs were effective.  - PCP is Dr. Shirline Frees. She does not see a cardiologist and denied prior stress test and cardiac cath. She is unsure, but doesn't think that she has had an echo either. - Neurologist is Dr. Arlice Colt. Last visit 01/27/17. He writes that "there are no great treatments for PPMS, she is off of any DMT." She has a combination fo physical and cognitive impairments and fatigue related to MS. She is on medications for her symptoms. 4-5 month follow-up planned.  - HEM-ONC is Dr. Nicholas Lose. RAD-ONC is Dr. Eppie Gibson. - She was seen by endocrinologist Dr. Elayne Snare on 07/13/16 for abnormal free T4. After repeat labs he wrote, "Please call to let patient know that the lab result indicates that there is no overactivity of the pituitary gland and T4 and T3 thyroid levels are falsely high because the body is trying to overcome a resistance to the hormones at the tissue level, no further evaluation is needed."  Meds include Klonopin, Flexeril, Zanaflex.  BP 132/82 Comment: manually rechecked by Ernesta Amble, RN  Pulse 82   Temp 36.4 C (Oral)    Resp 18   Wt 166 lb (75.3 kg)   SpO2 99%   BMI 28.49 kg/m    Last EKG seen was from 05/30/11 and showed SR, non-specific anterior T wave abnormalities.   Chest CT 02/19/17: IMPRESSION: 1. Stable appearance of 3 millimeter nodule within the right middle lobe. Given the stability, no additional follow-up is felt to be necessary. 2. The 6 millimeter mass within the lateral portion of the left breast warranting further evaluation. Diagnostic mammogram and left breast ultrasound recommended. 3. Coronary artery atherosclerosis.  Preoperative labs noted. Cr 0.79. Glucose 191 (non-fasting; no reported DM history). AST 58, ALT 44. Total bilirubin 1.4 (up from 0.6 on 03/10/17). H/H 15.9/44.6. PLT 287.   Patient with primary history of PPMS. No CAD, HTN, or DM history reported. No CV symptoms documented from PAT. She does have coronary calcifications on recent chest CT. Discussed with anesthesiologist Dr. Oren Bracket. If patient remains asymptomatic from a CV standpoint then it is felt that she would not require a pre-operative EKG (based solely on CT findings). If no acute changes then it is anticipated that she can proceed as planned. Anesthesiologist to discuss the definitve anesthesia plan on the day of surgery.  [facility]/Anesthesiology Phone [phone] 03/24/2017 12:48 PM

## 2017-03-29 ENCOUNTER — Ambulatory Visit
Admission: RE | Admit: 2017-03-29 | Discharge: 2017-03-29 | Disposition: A | Discharge status: Home / Self Care | Payer: 59 | Source: Ambulatory Visit | Attending: General Surgery | Admitting: General Surgery

## 2017-03-29 DIAGNOSIS — Z170 Estrogen receptor positive status [ER+]: Principal | ICD-10-CM

## 2017-03-29 DIAGNOSIS — C50412 Malignant neoplasm of upper-outer quadrant of left female breast: Secondary | ICD-10-CM | POA: Diagnosis not present

## 2017-03-29 NOTE — H&P (Signed)
[name] Documented: 03/10/2017 7:29 AM Location: [facility] Patient #: [id number] DOB: Jul 19, 1960 Undefined / Language: Cleophus Molt / Race: White Female   History of Present Illness Stark Klein MD; 03/10/2017 5:05 PM) The patient is a 57 year old female who presents with breast cancer. Pt is a 57 yo F referred for consultation by Dr. Shelly Bombard for a new diagnosis of left breast cancer. The patient had a normal screening mammogram in september 2018, but had a follow up chest CT 02/19/17. This was done to follow up pulmonary nodules given her history of smoking use. The scan showed a 6 mm breast nodule on the left. Diagnostic mammography was then performed which showed a 4 mm lesion at 2 o'clock. Core needle biopsy was performed which showed a grade 2 invasive ductal carcinoma wtih DCIS. This was +/+/+ with Ki 67 of 10%. There was no evidence of any other findings.   The patient does have a significant family history of cancer with her mother at age 20, three sisters, a great aunt, grandmother, and a father wtih pancreatic cancer. She has already had genetic testing which was negative.   She is a G1P1 with child born at age 7. She had menarche at age 40. She is post menopausal (around age 40). She did not use HRT or hormonal contraception. She has had a negative colonoscopy and bone density study. She is up to date wtih pap smear.   Of note, she has significant MS and is on disability for this. She has a stimulator for her peroneal nerve on the right because she has had foot drop causing quite a few falls. She also has fibromyalgia.    pathology 03/04/2017 NOSIS Diagnosis Breast, left, needle core biopsy, 2 o'clock - INVASIVE DUCTAL CARCINOMA. - DUCTAL CARCINOMA IN SITU WITH CALCIFICATIONS. - SEE COMMENT. Microscopic Comment The carcinoma appears grade II. Estrogen Receptor: 100%, POSITIVE, STRONG STAINING INTENSITY Progesterone Receptor: 20%, POSITIVE,  MODERATE STAINING INTENSITY Proliferation Marker Ki67: 10% Results: HER2 - **POSITIVE**IA  Dx mammogram/us 03/03/2017 ACR Breast Density Category b: There are scattered areas of fibroglandular density.  FINDINGS: In the middle third of the upper-outer quadrant of the left breast is a 4 mm spiculated mass. There are no malignant type microcalcifications.  Mammographic images were processed with CAD.  On physical exam, I do not palpate a mass in the upper-outer quadrant of the left breast.  Targeted ultrasound is performed, showing an irregular hypoechoic mass in the left breast at 2 o'clock 7 cm from the nipple measuring 3 x 4 x 4 mm. Sonographic evaluation of the left axilla does not show any enlarged adenopathy.  IMPRESSION: Suspicious left breast mass.  RECOMMENDATION: Ultrasound-guided core biopsy of the mass in the 2 o'clock region of the left breast is recommended. The biopsy will be scheduled the patient's convenience.  Chest Ct 02/19/17 further evaluation.  IMPRESSION: 1. Stable appearance of 3 millimeter nodule within the right middle lobe. Given the stability, no additional follow-up is felt to be necessary. 2. The 6 millimeter mass within the lateral portion of the left breast warranting further evaluation. Diagnostic mammogram and left breast ultrasound recommended. 3. Coronary artery atherosclerosis.  CMEt, CBC essentially normal except for AST 43.  screening mammogram 10/09/16 negative ([facility], [facility])    Past Surgical History Tawni Pummel, RN; 03/10/2017 7:29 AM) Knee Surgery  Left.  Diagnostic Studies History Tawni Pummel, RN; 03/10/2017 7:29 AM) Colonoscopy  1-5 years ago Mammogram  within last year Pap Smear  1-5 years ago  Medication History Tawni Pummel, RN; 03/10/2017 7:30 AM) Medications Reconciled  Social History Tawni Pummel, RN; 03/10/2017 7:29 AM) Alcohol use  Moderate alcohol use. Caffeine use   Coffee. Illicit drug use  Remotely quit drug use. Tobacco use  Former smoker.  Family History Tawni Pummel, RN; 03/10/2017 7:29 AM) Alcohol Abuse  Family Members In General. Breast Cancer  Family Members In General, Mother. Diabetes Mellitus  Father. Hypertension  Mother. Malignant Neoplasm Of Pancreas  Father. Ovarian Cancer  Family Members In General. Thyroid problems  Brother, Mother, Sister.  Other Problems Tawni Pummel, RN; 03/10/2017 7:29 AM) Anxiety Disorder  Arthritis  Bladder Problems  Depression  Hypercholesterolemia  Migraine Headache  Other disease, cancer, significant illness  Pancreatitis  Thyroid Disease     Review of Systems Sunday Spillers Ledford RN; 03/10/2017 7:29 AM) General Present- Fatigue and Weight Gain. Not Present- Appetite Loss, Chills, Fever, Night Sweats and Weight Loss. Skin Present- Dryness and Rash. Not Present- Change in Wart/Mole, Hives, Jaundice, New Lesions, Non-Healing Wounds and Ulcer. HEENT Present- Hoarseness, Seasonal Allergies, Visual Disturbances and Wears glasses/contact lenses. Not Present- Earache, Hearing Loss, Nose Bleed, Oral Ulcers, Ringing in the Ears, Sinus Pain, Sore Throat and Yellow Eyes. Respiratory Present- Wheezing. Not Present- Bloody sputum, Chronic Cough, Difficulty Breathing and Snoring. Breast Present- Breast Mass and Breast Pain. Not Present- Nipple Discharge and Skin Changes. Cardiovascular Present- Leg Cramps and Swelling of Extremities. Not Present- Chest Pain, Difficulty Breathing Lying Down, Palpitations, Rapid Heart Rate and Shortness of Breath. Gastrointestinal Present- Bloating, Difficulty Swallowing and Indigestion. Not Present- Abdominal Pain, Bloody Stool, Change in Bowel Habits, Chronic diarrhea, Constipation, Excessive gas, Gets full quickly at meals, Hemorrhoids, Nausea, Rectal Pain and Vomiting. Female Genitourinary Present- Nocturia and Urgency. Not Present- Frequency, Painful Urination and  Pelvic Pain. Musculoskeletal Present- Joint Pain, Joint Stiffness, Muscle Pain, Muscle Weakness and Swelling of Extremities. Not Present- Back Pain. Neurological Present- Decreased Memory, Headaches, Numbness, Tingling, Tremor, Trouble walking and Weakness. Not Present- Fainting and Seizures. Psychiatric Present- Anxiety, Depression and Fearful. Not Present- Bipolar, Change in Sleep Pattern and Frequent crying. Endocrine Present- Hair Changes, Heat Intolerance and Hot flashes. Not Present- Cold Intolerance, Excessive Hunger and New Diabetes. Hematology Present- Gland problems. Not Present- Blood Thinners, Easy Bruising, Excessive bleeding, HIV and Persistent Infections.  Vitals Stark Klein MD; 03/10/2017 4:51 PM) 03/10/2017 4:50 PM Weight: 168.6 lb Height: 64in Body Surface Area: 1.82 m Body Mass Index: 28.94 kg/m  Temp.: 97.28F  Pulse: 66 (Regular)  Resp.: 17 (Unlabored)  BP: 151/89 (Sitting, Left Arm, Standard)       Physical Exam Stark Klein MD; 03/10/2017 5:04 PM) General Mental Status-Alert. General Appearance-Consistent with stated age. Hydration-Well hydrated. Voice-Normal.  Head and Neck Head-normocephalic, atraumatic with no lesions or palpable masses. Trachea-midline. Thyroid Gland Characteristics - normal size and consistency.  Eye Eyeball - Bilateral-Extraocular movements intact. Sclera/Conjunctiva - Bilateral-No scleral icterus.  Chest and Lung Exam Chest and lung exam reveals -quiet, even and easy respiratory effort with no use of accessory muscles and on auscultation, normal breath sounds, no adventitious sounds and normal vocal resonance. Inspection Chest Wall - Normal. Back - normal.  Breast Note: bruising on lateral left breast. no palpable masses. no skin dimpling or nipple retraction. no nipple discharge. no LAD. right breast nl. breasts moderate ptosis and reasonably symmetric.   Cardiovascular Cardiovascular  examination reveals -normal heart sounds, regular rate and rhythm with no murmurs and normal pedal pulses bilaterally.  Abdomen Inspection Inspection of the abdomen reveals -  No Hernias. Palpation/Percussion Palpation and Percussion of the abdomen reveal - Soft, Non Tender, No Rebound tenderness, No Rigidity (guarding) and No hepatosplenomegaly. Auscultation Auscultation of the abdomen reveals - Bowel sounds normal.  Neurologic Neurologic evaluation reveals -alert and oriented x 3 with no impairment of recent or remote memory. Mental Status-Normal. Note: stimulator on upper left calf. difficulty getting on exam table wtihout help.   Musculoskeletal Normal Exam - Left-Upper Extremity Strength Normal. Note: antalgic gait Normal Exam - Right-Upper Extremity Strength Normal.  Lymphatic Head & Neck  General Head & Neck Lymphatics: Bilateral - Description - Normal. Axillary  General Axillary Region: Bilateral - Description - Normal. Tenderness - Non Tender. Femoral & Inguinal  Generalized Femoral & Inguinal Lymphatics: Bilateral - Description - No Generalized lymphadenopathy.    Assessment & Plan Stark Klein MD; 03/10/2017 5:12 PM) PRIMARY CANCER OF UPPER OUTER QUADRANT OF LEFT FEMALE BREAST (C50.412) Impression: Patient is a 57 year old female with significant medical comorbidity of progressive multiple sclerosis who has a new diagnosis of clinical T1 a left breast cancer. This is HER-2 positive, but is quite small. It also is low-grade and has a low proliferation index. She does have a significant family history, but has already had an extensive genetics panel which was negative for heritable cancer syndromes. She is a good candidate for lumpectomy based on breast size and location of the tumor. We will plan to do a seed localized lumpectomy with a sentinel lymph node biopsy. I reviewed surgery with the patient and her husband. I discussed the risks of surgery. I discussed  the timeline of the procedure is required to have this operation and anticipated recovery. She does have to use her upper extremities quite a bit because of her lower extremity weakness. I discussed that this does increase her risk of lymphedema somewhat, but is not avoidable. I did advise her not to go to the gym for a week after surgery.  The surgical procedure was described to the patient. I discussed the incision type and location and that we would need radiology involved on with a wire or seed marker and/or sentinel node.  The risks and benefits of the procedure were described to the patient and she wishes to proceed.  We discussed the risks bleeding, infection, damage to other structures, need for further procedures/surgeries. We discussed the risk of seroma. The patient was advised if the area in the breast in cancer, we may need to go back to surgery for additional tissue to obtain negative margins or for a lymph node biopsy. The patient was advised that these are the most common complications, but that others can occur as well. They were advised against taking aspirin or other anti-inflammatory agents/blood thinners the week before surgery.  We did have quite a discussion about her potential issues with IV sedation for block placement. She has not had any anesthes Current Plans You are being scheduled for surgery- Our schedulers will call you.  You should hear from our office's scheduling department within 5 working days about the location, date, and time of surgery. We try to make accommodations for patient's preferences in scheduling surgery, but sometimes the OR schedule or the surgeon's schedule prevents [place] from making those accommodations.  If you have not heard from our office [phone]) in 5 working days, call the office and ask for your surgeon's nurse.  If you have other questions about your diagnosis, plan, or surgery, call the office and ask for your surgeon's nurse.  Pt  Education -  [facility]S Breast Cancer Information Given - [employer] "Breast Journey" Package Pt Education - flb breast cancer surgery: discussed with patient and provided information.   Signed by Stark Klein, MD (03/10/2017 5:13 PM)

## 2017-03-31 ENCOUNTER — Ambulatory Visit (HOSPITAL_COMMUNITY): Payer: 59 | Admitting: Anesthesiology

## 2017-03-31 ENCOUNTER — Ambulatory Visit (HOSPITAL_COMMUNITY): Payer: 59 | Admitting: Vascular Surgery

## 2017-03-31 ENCOUNTER — Encounter (HOSPITAL_COMMUNITY)
Admission: RE | Admit: 2017-03-31 | Discharge: 2017-03-31 | Disposition: A | Discharge status: Home / Self Care | Payer: 59 | Source: Ambulatory Visit | Attending: General Surgery | Admitting: General Surgery

## 2017-03-31 ENCOUNTER — Encounter (HOSPITAL_COMMUNITY)
Admission: RE | Disposition: A | Discharge status: Home / Self Care | Payer: Self-pay | Source: Ambulatory Visit | Attending: General Surgery

## 2017-03-31 ENCOUNTER — Ambulatory Visit
Admission: RE | Admit: 2017-03-31 | Discharge: 2017-03-31 | Disposition: A | Discharge status: Home / Self Care | Payer: 59 | Source: Ambulatory Visit | Attending: General Surgery | Admitting: General Surgery

## 2017-03-31 ENCOUNTER — Ambulatory Visit (HOSPITAL_COMMUNITY)
Admission: RE | Admit: 2017-03-31 | Discharge: 2017-03-31 | Disposition: A | Discharge status: Home / Self Care | Payer: 59 | Source: Ambulatory Visit | Attending: General Surgery | Admitting: General Surgery

## 2017-03-31 ENCOUNTER — Encounter (HOSPITAL_COMMUNITY): Payer: Self-pay | Admitting: *Deleted

## 2017-03-31 DIAGNOSIS — Z87891 Personal history of nicotine dependence: Secondary | ICD-10-CM | POA: Insufficient documentation

## 2017-03-31 DIAGNOSIS — R1310 Dysphagia, unspecified: Secondary | ICD-10-CM | POA: Insufficient documentation

## 2017-03-31 DIAGNOSIS — Z803 Family history of malignant neoplasm of breast: Secondary | ICD-10-CM | POA: Insufficient documentation

## 2017-03-31 DIAGNOSIS — Z8041 Family history of malignant neoplasm of ovary: Secondary | ICD-10-CM | POA: Diagnosis not present

## 2017-03-31 DIAGNOSIS — C50412 Malignant neoplasm of upper-outer quadrant of left female breast: Secondary | ICD-10-CM

## 2017-03-31 DIAGNOSIS — K859 Acute pancreatitis without necrosis or infection, unspecified: Secondary | ICD-10-CM | POA: Diagnosis not present

## 2017-03-31 DIAGNOSIS — Z811 Family history of alcohol abuse and dependence: Secondary | ICD-10-CM | POA: Diagnosis not present

## 2017-03-31 DIAGNOSIS — J302 Other seasonal allergic rhinitis: Secondary | ICD-10-CM | POA: Insufficient documentation

## 2017-03-31 DIAGNOSIS — G8918 Other acute postprocedural pain: Secondary | ICD-10-CM | POA: Diagnosis not present

## 2017-03-31 DIAGNOSIS — N644 Mastodynia: Secondary | ICD-10-CM | POA: Diagnosis not present

## 2017-03-31 DIAGNOSIS — Z833 Family history of diabetes mellitus: Secondary | ICD-10-CM | POA: Diagnosis not present

## 2017-03-31 DIAGNOSIS — G43909 Migraine, unspecified, not intractable, without status migrainosus: Secondary | ICD-10-CM | POA: Diagnosis not present

## 2017-03-31 DIAGNOSIS — M1990 Unspecified osteoarthritis, unspecified site: Secondary | ICD-10-CM | POA: Insufficient documentation

## 2017-03-31 DIAGNOSIS — G801 Spastic diplegic cerebral palsy: Secondary | ICD-10-CM | POA: Diagnosis not present

## 2017-03-31 DIAGNOSIS — G35 Multiple sclerosis: Secondary | ICD-10-CM | POA: Diagnosis not present

## 2017-03-31 DIAGNOSIS — Z170 Estrogen receptor positive status [ER+]: Principal | ICD-10-CM

## 2017-03-31 DIAGNOSIS — E7800 Pure hypercholesterolemia, unspecified: Secondary | ICD-10-CM | POA: Insufficient documentation

## 2017-03-31 DIAGNOSIS — F419 Anxiety disorder, unspecified: Secondary | ICD-10-CM | POA: Diagnosis not present

## 2017-03-31 DIAGNOSIS — C50912 Malignant neoplasm of unspecified site of left female breast: Secondary | ICD-10-CM | POA: Diagnosis not present

## 2017-03-31 DIAGNOSIS — Z8349 Family history of other endocrine, nutritional and metabolic diseases: Secondary | ICD-10-CM | POA: Insufficient documentation

## 2017-03-31 DIAGNOSIS — I2510 Atherosclerotic heart disease of native coronary artery without angina pectoris: Secondary | ICD-10-CM | POA: Diagnosis not present

## 2017-03-31 DIAGNOSIS — Z8249 Family history of ischemic heart disease and other diseases of the circulatory system: Secondary | ICD-10-CM | POA: Insufficient documentation

## 2017-03-31 DIAGNOSIS — Z800 Family history of malignant neoplasm of digestive organs: Secondary | ICD-10-CM | POA: Insufficient documentation

## 2017-03-31 DIAGNOSIS — F329 Major depressive disorder, single episode, unspecified: Secondary | ICD-10-CM | POA: Insufficient documentation

## 2017-03-31 HISTORY — PX: BREAST LUMPECTOMY WITH RADIOACTIVE SEED AND SENTINEL LYMPH NODE BIOPSY: SHX6550

## 2017-03-31 SURGERY — BREAST LUMPECTOMY WITH RADIOACTIVE SEED AND SENTINEL LYMPH NODE BIOPSY
Anesthesia: General | Site: Breast | Laterality: Left

## 2017-03-31 MED ORDER — BUPIVACAINE-EPINEPHRINE (PF) 0.25% -1:200000 IJ SOLN
INTRAMUSCULAR | Status: DC | PRN
  Administered 2017-03-31: 30 mL

## 2017-03-31 MED ORDER — CELECOXIB 200 MG PO CAPS
200.0000 mg | ORAL_CAPSULE | ORAL | Status: AC
  Administered 2017-03-31: 200 mg via ORAL
  Filled 2017-03-31: qty 1

## 2017-03-31 MED ORDER — ACETAMINOPHEN 500 MG PO TABS
1000.0000 mg | ORAL_TABLET | ORAL | Status: AC
  Administered 2017-03-31: 1000 mg via ORAL
  Filled 2017-03-31: qty 2

## 2017-03-31 MED ORDER — PROMETHAZINE HCL 25 MG/ML IJ SOLN: 6.2500 mg | INTRAMUSCULAR | Status: DC | PRN

## 2017-03-31 MED ORDER — MIDAZOLAM HCL 2 MG/2ML IJ SOLN
INTRAMUSCULAR | Status: AC
Start: 2017-03-31 — End: ?
  Filled 2017-03-31: qty 2

## 2017-03-31 MED ORDER — BUPIVACAINE-EPINEPHRINE 0.25% -1:200000 IJ SOLN
INTRAMUSCULAR | Status: AC
  Filled 2017-03-31: qty 1

## 2017-03-31 MED ORDER — 0.9 % SODIUM CHLORIDE (POUR BTL) OPTIME
TOPICAL | Status: DC | PRN
  Administered 2017-03-31: 1000 mL

## 2017-03-31 MED ORDER — MIDAZOLAM HCL 5 MG/5ML IJ SOLN
INTRAMUSCULAR | Status: DC | PRN
  Administered 2017-03-31: 2 mg via INTRAVENOUS

## 2017-03-31 MED ORDER — ONDANSETRON HCL 4 MG/2ML IJ SOLN
INTRAMUSCULAR | Status: AC
  Filled 2017-03-31: qty 2

## 2017-03-31 MED ORDER — PROPOFOL 10 MG/ML IV BOLUS
INTRAVENOUS | Status: DC | PRN
  Administered 2017-03-31: 200 mg via INTRAVENOUS

## 2017-03-31 MED ORDER — PROPOFOL 10 MG/ML IV BOLUS
INTRAVENOUS | Status: AC
  Filled 2017-03-31: qty 20

## 2017-03-31 MED ORDER — FENTANYL CITRATE (PF) 100 MCG/2ML IJ SOLN
INTRAMUSCULAR | Status: DC | PRN
  Administered 2017-03-31: 50 ug via INTRAVENOUS
  Administered 2017-03-31: 25 ug via INTRAVENOUS
  Administered 2017-03-31: 50 ug via INTRAVENOUS
  Administered 2017-03-31: 25 ug via INTRAVENOUS

## 2017-03-31 MED ORDER — CEFAZOLIN SODIUM-DEXTROSE 2-4 GM/100ML-% IV SOLN
2.0000 g | INTRAVENOUS | Status: AC
  Administered 2017-03-31: 2 g via INTRAVENOUS
  Filled 2017-03-31: qty 100

## 2017-03-31 MED ORDER — LIDOCAINE HCL (CARDIAC) 20 MG/ML IV SOLN
INTRAVENOUS | Status: DC | PRN
  Administered 2017-03-31: 60 mg via INTRAVENOUS

## 2017-03-31 MED ORDER — LACTATED RINGERS IV SOLN
INTRAVENOUS | Status: DC | PRN
  Administered 2017-03-31: 08:00:00 via INTRAVENOUS

## 2017-03-31 MED ORDER — OXYCODONE HCL 5 MG PO TABS: 5.0000 mg | ORAL_TABLET | Freq: Four times a day (QID) | ORAL | 0 refills | Status: DC | PRN

## 2017-03-31 MED ORDER — GABAPENTIN 300 MG PO CAPS
300.0000 mg | ORAL_CAPSULE | ORAL | Status: AC
  Administered 2017-03-31: 300 mg via ORAL
  Filled 2017-03-31: qty 1

## 2017-03-31 MED ORDER — HYDROMORPHONE HCL 1 MG/ML IJ SOLN: 0.2500 mg | INTRAMUSCULAR | Status: DC | PRN

## 2017-03-31 MED ORDER — ONDANSETRON HCL 4 MG/2ML IJ SOLN
INTRAMUSCULAR | Status: DC | PRN
  Administered 2017-03-31: 4 mg via INTRAVENOUS

## 2017-03-31 MED ORDER — DEXAMETHASONE SODIUM PHOSPHATE 10 MG/ML IJ SOLN
INTRAMUSCULAR | Status: DC | PRN
  Administered 2017-03-31: 8 mg via INTRAVENOUS

## 2017-03-31 MED ORDER — LIDOCAINE HCL 1 % IJ SOLN
INTRAMUSCULAR | Status: AC
  Filled 2017-03-31: qty 20

## 2017-03-31 MED ORDER — KETOROLAC TROMETHAMINE 30 MG/ML IJ SOLN: 30.0000 mg | Freq: Once | INTRAMUSCULAR | Status: DC | PRN

## 2017-03-31 MED ORDER — DEXAMETHASONE SODIUM PHOSPHATE 10 MG/ML IJ SOLN
INTRAMUSCULAR | Status: AC
  Filled 2017-03-31: qty 1

## 2017-03-31 MED ORDER — LIDOCAINE HCL (CARDIAC) 20 MG/ML IV SOLN
INTRAVENOUS | Status: AC
  Filled 2017-03-31: qty 5

## 2017-03-31 MED ORDER — TECHNETIUM TC 99M SULFUR COLLOID FILTERED
1.0000 | Freq: Once | INTRAVENOUS | Status: AC | PRN
  Administered 2017-03-31: 1 via INTRADERMAL

## 2017-03-31 MED ORDER — BUPIVACAINE-EPINEPHRINE (PF) 0.5% -1:200000 IJ SOLN
INTRAMUSCULAR | Status: DC | PRN
  Administered 2017-03-31: 25 mL

## 2017-03-31 MED ORDER — FENTANYL CITRATE (PF) 250 MCG/5ML IJ SOLN
INTRAMUSCULAR | Status: AC
  Filled 2017-03-31: qty 5

## 2017-03-31 SURGICAL SUPPLY — 53 items
ADH SKN CLS APL DERMABOND .7 (GAUZE/BANDAGES/DRESSINGS) ×2
BINDER BREAST LRG (GAUZE/BANDAGES/DRESSINGS) IMPLANT
BINDER BREAST XLRG (GAUZE/BANDAGES/DRESSINGS) ×1 IMPLANT
BLADE SURG 10 STRL SS (BLADE) ×2 IMPLANT
BNDG COHESIVE 4X5 TAN STRL (GAUZE/BANDAGES/DRESSINGS) ×2 IMPLANT
CANISTER SUCT 3000ML PPV (MISCELLANEOUS) ×2 IMPLANT
CHLORAPREP W/TINT 26ML (MISCELLANEOUS) ×2 IMPLANT
CLIP VESOCCLUDE LG 6/CT (CLIP) ×2 IMPLANT
CLIP VESOCCLUDE MED 6/CT (CLIP) ×4 IMPLANT
CLIP VESOCCLUDE SM WIDE 6/CT (CLIP) ×2 IMPLANT
CONT SPEC 4OZ CLIKSEAL STRL BL (MISCELLANEOUS) IMPLANT
COVER PROBE W GEL 5X96 (DRAPES) ×2 IMPLANT
COVER SURGICAL LIGHT HANDLE (MISCELLANEOUS) ×2 IMPLANT
DERMABOND ADVANCED (GAUZE/BANDAGES/DRESSINGS) ×2
DERMABOND ADVANCED .7 DNX12 (GAUZE/BANDAGES/DRESSINGS) ×2 IMPLANT
DEVICE DUBIN SPECIMEN MAMMOGRA (MISCELLANEOUS) ×1 IMPLANT
DRAPE CHEST BREAST 15X10 FENES (DRAPES) IMPLANT
DRAPE UNIVERSAL PACK (DRAPES) ×2 IMPLANT
DRAPE UTILITY XL STRL (DRAPES) ×2 IMPLANT
DRSG PAD ABDOMINAL 8X10 ST (GAUZE/BANDAGES/DRESSINGS) ×2 IMPLANT
ELECT CAUTERY BLADE 6.4 (BLADE) ×2 IMPLANT
ELECT COATED BLADE 2.86 ST (ELECTRODE) ×1 IMPLANT
ELECT REM PT RETURN 9FT ADLT (ELECTROSURGICAL) ×2
ELECTRODE REM PT RTRN 9FT ADLT (ELECTROSURGICAL) ×1 IMPLANT
GAUZE SPONGE 4X4 12PLY STRL (GAUZE/BANDAGES/DRESSINGS) ×2 IMPLANT
GLOVE BIO SURGEON STRL SZ 6 (GLOVE) ×1 IMPLANT
GLOVE INDICATOR 6.5 STRL GRN (GLOVE) IMPLANT
GOWN STRL REUS W/ TWL LRG LVL3 (GOWN DISPOSABLE) ×1 IMPLANT
GOWN STRL REUS W/TWL 2XL LVL3 (GOWN DISPOSABLE) ×2 IMPLANT
GOWN STRL REUS W/TWL LRG LVL3 (GOWN DISPOSABLE) ×2
KIT BASIN OR (CUSTOM PROCEDURE TRAY) ×2 IMPLANT
KIT MARKER MARGIN INK (KITS) ×2 IMPLANT
LIGHT WAVEGUIDE WIDE FLAT (MISCELLANEOUS) ×2 IMPLANT
NDL FILTER BLUNT 18X1 1/2 (NEEDLE) IMPLANT
NDL HYPO 25GX1X1/2 BEV (NEEDLE) ×1 IMPLANT
NDL SAFETY ECLIPSE 18X1.5 (NEEDLE) IMPLANT
NEEDLE FILTER BLUNT 18X 1/2SAF (NEEDLE)
NEEDLE FILTER BLUNT 18X1 1/2 (NEEDLE) IMPLANT
NEEDLE HYPO 18GX1.5 SHARP (NEEDLE)
NEEDLE HYPO 25GX1X1/2 BEV (NEEDLE) ×2 IMPLANT
NS IRRIG 1000ML POUR BTL (IV SOLUTION) ×2 IMPLANT
PACK SURGICAL SETUP 50X90 (CUSTOM PROCEDURE TRAY) ×2 IMPLANT
PENCIL BUTTON HOLSTER BLD 10FT (ELECTRODE) ×2 IMPLANT
SPONGE LAP 18X18 X RAY DECT (DISPOSABLE) ×2 IMPLANT
STOCKINETTE IMPERVIOUS 9X36 MD (GAUZE/BANDAGES/DRESSINGS) ×2 IMPLANT
SUT MNCRL AB 4-0 PS2 18 (SUTURE) ×2 IMPLANT
SUT VIC AB 3-0 SH 8-18 (SUTURE) ×2 IMPLANT
SYR BULB 3OZ (MISCELLANEOUS) ×2 IMPLANT
SYR CONTROL 10ML LL (SYRINGE) ×2 IMPLANT
TOWEL OR 17X24 6PK STRL BLUE (TOWEL DISPOSABLE) ×2 IMPLANT
TOWEL OR 17X26 10 PK STRL BLUE (TOWEL DISPOSABLE) ×2 IMPLANT
TUBE CONNECTING 12X1/4 (SUCTIONS) ×2 IMPLANT
YANKAUER SUCT BULB TIP NO VENT (SUCTIONS) ×2 IMPLANT

## 2017-03-31 NOTE — Op Note (Signed)
Left Breast Radioactive seed localized lumpectomy and sentinel lymph node biopsy  Indications: This patient presents with history of left breast cancer, upper outer quadrant, [id number], +/+/+  Pre-operative Diagnosis: left breast cancer  Post-operative Diagnosis: Same  Surgeon: Stark Klein   Anesthesia: General endotracheal anesthesia  ASA Class: III  Procedure Details  The patient was seen in the Holding Room. The risks, benefits, complications, treatment options, and expected outcomes were discussed with the patient. The possibilities of bleeding, infection, the need for additional procedures, failure to diagnose a condition, and creating a complication requiring transfusion or operation were discussed with the patient. The patient concurred with the proposed plan, giving informed consent.  The site of surgery properly noted/marked. The patient was taken to Operating Room # 2, identified, and the procedure verified as Left Breast seed localized lumpectomy with sentinel lymph node biopsy. A Time Out was held and the above information confirmed.  The left arm, breast, and chest were prepped and draped in standard fashion. The lumpectomy was performed by creating an axillary incision near the previously placed radioactive seed.  Dissection was carried down to around the point of maximum signal intensity. The cautery was used to perform the dissection.  Hemostasis was achieved with cautery. The edges of the cavity were marked with large clips, with one each medial, lateral, inferior and superior, and two clips posteriorly.   The specimen was inked with the margin marker paint kit.    Specimen radiography confirmed inclusion of the mammographic lesion, the clip, and the seed.  The background signal in the breast was zero.  An additional inferior margin was taken and marked with the paint kit.  Using a hand-held gamma probe, two axillary sentinel nodes were identified.  The same incision was used.   Dissection was carried through the clavipectoral fascia.  Two deep level 2 axillary sentinel nodes were removed.  Counts per second were 1400 and 490.    The background count was 20 cps.  The wound was irrigated.  Hemostasis was achieved with cautery.  The axillary incision was closed with a 3-0 vicryl deep dermal interrupted sutures and a 4-0 monocryl subcuticular closure.    Sterile dressings were applied. At the end of the operation, all sponge, instrument, and needle counts were correct.  Findings: grossly clear surgical margins and no adenopathy  Estimated Blood Loss:  min         Specimens: Left breast tissue with seed, additional inferior margin, and 2 axillary sentinel lymph nodes.           Complications:  None; patient tolerated the procedure well.         Disposition: PACU - hemodynamically stable.         Condition: stable

## 2017-03-31 NOTE — Discharge Instructions (Addendum)
[facility] °Office Phone Number [phone] ° °BREAST BIOPSY/ PARTIAL MASTECTOMY: POST OP INSTRUCTIONS ° °Always review your discharge instruction sheet given to you by the facility where your surgery was performed. ° °IF YOU HAVE DISABILITY OR FAMILY LEAVE FORMS, YOU MUST BRING THEM TO THE OFFICE FOR PROCESSING.  DO NOT GIVE THEM TO YOUR DOCTOR. ° °1. A prescription for pain medication may be given to you upon discharge.  Take your pain medication as prescribed, if needed.  If narcotic pain medicine is not needed, then you may take acetaminophen (Tylenol) or ibuprofen (Advil) as needed. °2. Take your usually prescribed medications unless otherwise directed °3. If you need a refill on your pain medication, please contact your pharmacy.  They will contact our office to request authorization.  Prescriptions will not be filled after 5pm or on week-ends. °4. You should eat very light the first 24 hours after surgery, such as soup, crackers, pudding, etc.  Resume your normal diet the day after surgery. °5. Most patients will experience some swelling and bruising in the breast.  Ice packs and a good support bra will help.  Swelling and bruising can take several days to resolve.  °6. It is common to experience some constipation if taking pain medication after surgery.  Increasing fluid intake and taking a stool softener will usually help or prevent this problem from occurring.  A mild laxative (Milk of Magnesia or Miralax) should be taken according to package directions if there are no bowel movements after 48 hours. °7. Unless discharge instructions indicate otherwise, you may remove your bandages 48 hours after surgery, and you may shower at that time.  You may have steri-strips (small skin tapes) in place directly over the incision.  These strips should be left on the skin for 7-10 days.   Any sutures or staples will be removed at the office during your follow-up visit. °8. ACTIVITIES:  You may resume  regular daily activities (gradually increasing) beginning the next day.  Wearing a good support bra or sports bra (or the breast binder) minimizes pain and swelling.  You may have sexual intercourse when it is comfortable. °a. You may drive when you no longer are taking prescription pain medication, you can comfortably wear a seatbelt, and you can safely maneuver your car and apply brakes. °b. RETURN TO WORK:  __________1 week_______________ °9. You should see your doctor in the office for a follow-up appointment approximately two weeks after your surgery.  Your doctor’s nurse will typically make your follow-up appointment when she calls you with your pathology report.  Expect your pathology report 2-3 business days after your surgery.  You may call to check if you do not hear from us after three days. ° ° °WHEN TO CALL YOUR DOCTOR: °1. Fever over 101.0 °2. Nausea and/or vomiting. °3. Extreme swelling or bruising. °4. Continued bleeding from incision. °5. Increased pain, redness, or drainage from the incision. ° °The clinic staff is available to answer your questions during regular business hours.  Please don’t hesitate to call and ask to speak to one of the nurses for clinical concerns.  If you have a medical emergency, go to the nearest emergency room or call 911.  A surgeon from [facility] is always on call at the hospital. ° °For further questions, please visit [email or web address]  ° °

## 2017-03-31 NOTE — Anesthesia Procedure Notes (Signed)
Anesthesia Procedure Image    

## 2017-03-31 NOTE — Anesthesia Postprocedure Evaluation (Signed)
Anesthesia Post Note  Patient: Taylor Hancock  Procedure(s) Performed: BREAST LUMPECTOMY WITH RADIOACTIVE SEED AND SENTINEL LYMPH NODE BIOPSY (Left Breast)     Patient location during evaluation: PACU Anesthesia Type: General Level of consciousness: awake and alert Pain management: pain level controlled Vital Signs Assessment: post-procedure vital signs reviewed and stable Respiratory status: spontaneous breathing, nonlabored ventilation, respiratory function stable and patient connected to nasal cannula oxygen Cardiovascular status: blood pressure returned to baseline and stable Postop Assessment: no apparent nausea or vomiting Anesthetic complications: no    Last Vitals:  Vitals:   03/31/17 1035 03/31/17 1055  BP: (!) 151/95 (!) 166/99  Pulse: 82 83  Resp: (!) 22 20  Temp: 36.7 C   SpO2: 96% 97%    Last Pain:  Vitals:   03/31/17 1010  TempSrc:   PainSc: 0-No pain                 Jennings Corado S

## 2017-03-31 NOTE — Anesthesia Preprocedure Evaluation (Signed)
Anesthesia Evaluation  Patient identified by MRN, date of birth, ID band Patient awake    Reviewed: Allergy & Precautions, NPO status , Patient's Chart, lab work & pertinent test results  Airway Mallampati: II  TM Distance: >3 FB Neck ROM: Full    Dental no notable dental hx.    Pulmonary neg pulmonary ROS, former smoker,    Pulmonary exam normal breath sounds clear to auscultation       Cardiovascular negative cardio ROS Normal cardiovascular exam Rhythm:Regular Rate:Normal     Neuro/Psych MS  Neuromuscular disease negative psych ROS   GI/Hepatic negative GI ROS, Neg liver ROS,   Endo/Other  negative endocrine ROS  Renal/GU negative Renal ROS  negative genitourinary   Musculoskeletal negative musculoskeletal ROS (+)   Abdominal   Peds negative pediatric ROS (+)  Hematology negative hematology ROS (+)   Anesthesia Other Findings   Reproductive/Obstetrics negative OB ROS                             Anesthesia Physical Anesthesia Plan  ASA: III  Anesthesia Plan: General   Post-op Pain Management:  Regional for Post-op pain   Induction: Intravenous  PONV Risk Score and Plan: 3 and Ondansetron, Dexamethasone, Midazolam and Treatment may vary due to age or medical condition  Airway Management Planned: LMA  Additional Equipment:   Intra-op Plan:   Post-operative Plan: Extubation in OR  Informed Consent: I have reviewed the patients History and Physical, chart, labs and discussed the procedure including the risks, benefits and alternatives for the proposed anesthesia with the patient or authorized representative who has indicated his/her understanding and acceptance.   Dental advisory given  Plan Discussed with: CRNA and Surgeon  Anesthesia Plan Comments:         Anesthesia Quick Evaluation

## 2017-03-31 NOTE — Transfer of Care (Signed)
Immediate Anesthesia Transfer of Care Note  Patient: Taylor Hancock  Procedure(s) Performed: BREAST LUMPECTOMY WITH RADIOACTIVE SEED AND SENTINEL LYMPH NODE BIOPSY (Left Breast)  Patient Location: PACU  Anesthesia Type:GA combined with regional for post-op pain  Level of Consciousness: awake, alert  and oriented  Airway & Oxygen Therapy: Patient Spontanous Breathing and Patient connected to nasal cannula oxygen  Post-op Assessment: Report given to RN, Post -op Vital signs reviewed and stable and Patient moving all extremities X 4  Post vital signs: Reviewed and stable  Last Vitals:  Vitals:   03/31/17 0655  BP: (!) 166/107  Pulse: 85  Resp: 18  Temp: 36.5 C  SpO2: 100%    Last Pain:  Vitals:   03/31/17 0711  TempSrc:   PainSc: 3          Complications: No apparent anesthesia complications

## 2017-03-31 NOTE — Anesthesia Procedure Notes (Signed)
Anesthesia Regional Block: Pectoralis block   Pre-Anesthetic Checklist: ,, timeout performed, Correct Patient, Correct Site, Correct Laterality, Correct Procedure, Correct Position, site marked, Risks and benefits discussed,  Surgical consent,  Pre-op evaluation,  At surgeon's request and post-op pain management  Laterality: Left  Prep: chloraprep       Needles:  Injection technique: Single-shot  Needle Type: Echogenic Needle     Needle Length: 9cm      Additional Needles:   Procedures:,,,, ultrasound used (permanent image in chart),,,,  Narrative:  Start time: 03/31/2017 8:00 AM End time: 03/31/2017 8:12 AM Injection made incrementally with aspirations every 5 mL.  Performed by: Personally  Anesthesiologist: Myrtie Soman, MD  Additional Notes: Patient tolerated the procedure well without complications

## 2017-03-31 NOTE — Interval H&P Note (Signed)
History and Physical Interval Note:  03/31/2017 8:40 AM  Taylor Hancock  has presented today for surgery, with the diagnosis of LEFT BREAST CANCER  The various methods of treatment have been discussed with the patient and family. After consideration of risks, benefits and other options for treatment, the patient has consented to  Procedure(s): BREAST LUMPECTOMY WITH RADIOACTIVE SEED AND SENTINEL LYMPH NODE BIOPSY (Left) as a surgical intervention .  The patient's history has been reviewed, patient examined, no change in status, stable for surgery.  I have reviewed the patient's chart and labs.  Questions were answered to the patient's satisfaction.     Stark Klein

## 2017-03-31 NOTE — Anesthesia Procedure Notes (Signed)
Procedure Name: LMA Insertion Date/Time: 03/31/2017 9:00 AM Performed by: Kyung Rudd, CRNA Pre-anesthesia Checklist: Patient identified, Emergency Drugs available, Suction available and Patient being monitored Patient Re-evaluated:Patient Re-evaluated prior to induction Oxygen Delivery Method: Circle system utilized Preoxygenation: Pre-oxygenation with 100% oxygen Induction Type: IV induction LMA: LMA inserted LMA Size: 4.0 Placement Confirmation: positive ETCO2 and breath sounds checked- equal and bilateral Tube secured with: Tape Dental Injury: Teeth and Oropharynx as per pre-operative assessment

## 2017-04-01 ENCOUNTER — Encounter (HOSPITAL_COMMUNITY): Payer: Self-pay | Admitting: General Surgery

## 2017-04-08 ENCOUNTER — Inpatient Hospital Stay: Payer: 59 | Attending: Hematology and Oncology | Admitting: Hematology and Oncology

## 2017-04-08 ENCOUNTER — Encounter: Payer: Self-pay | Admitting: Radiation Oncology

## 2017-04-08 ENCOUNTER — Encounter: Payer: Self-pay | Admitting: *Deleted

## 2017-04-08 DIAGNOSIS — C50412 Malignant neoplasm of upper-outer quadrant of left female breast: Secondary | ICD-10-CM

## 2017-04-08 DIAGNOSIS — Z170 Estrogen receptor positive status [ER+]: Secondary | ICD-10-CM | POA: Insufficient documentation

## 2017-04-08 NOTE — Assessment & Plan Note (Signed)
03/31/2017:Left lumpectomy: IDC 0.5 cm, grade 3, DCIS high-grade, margins negative, 0/2 lymph nodes negative, ER 100%, PR 20%, Ki-67 10%, HER-2 positive ratio 2.96 T1 a N0 stage I a  Pathology counseling: I discussed the final pathology report of the patient provided  a copy of this report. I discussed the margins as well as lymph node surgeries. We also discussed the final staging along with previously performed ER/PR and HER-2/neu testing.  Recommendation: 1 adjuvant systemic chemotherapy with Taxol Herceptin followed by Herceptin maintenance for 1 year was offered.  Patient fully understands that the benefits of chemotherapy for tumors such as hers is small 2. adjuvant radiation therapy followed by 3. Adjuvant antiestrogen therapy   

## 2017-04-08 NOTE — Progress Notes (Signed)
Patient Care Team: Shirline Frees, MD as PCP - General (Family Medicine)  DIAGNOSIS:  Encounter Diagnosis  Name Primary?  . Malignant neoplasm of upper-outer quadrant of left breast in female, estrogen receptor positive ([place])     SUMMARY OF ONCOLOGIC HISTORY:   Malignant neoplasm of upper-outer quadrant of left breast in female, estrogen receptor positive ([place])   03/04/2017 Initial Diagnosis    Screening detected 4 mm spiculated mass: left breast biopsy 2 o'clock position: IDC grade 2 With DCIS ER 100%, PR 20%, Ki-67 10%, HER-2 positive ratio 2.96, T1 a N0 stage I a      03/31/2017 Surgery    Left lumpectomy: IDC 0.5 cm, grade 3, DCIS high-grade, margins negative, 0/2 lymph nodes negative, ER 100%, PR 20%, Ki-67 10%, HER-2 positive ratio 2.96 T1 a N0 stage I a       CHIEF COMPLIANT: Follow-up after recent left lumpectomy  INTERVAL HISTORY: Taylor Hancock is a 57 year old with above-mentioned history left breast cancer underwent left lumpectomy and is here today to discuss the pathology report.  She is recovering very well from recent surgery.  REVIEW OF SYSTEMS:   Constitutional: Denies fevers, chills or abnormal weight loss Eyes: Denies blurriness of vision Ears, nose, mouth, throat, and face: Denies mucositis or sore throat Respiratory: Denies cough, dyspnea or wheezes Cardiovascular: Denies palpitation, chest discomfort Gastrointestinal:  Denies nausea, heartburn or change in bowel habits Skin: Denies abnormal skin rashes Lymphatics: Denies new lymphadenopathy or easy bruising multiple sclerosis she may be denies numbness, tingling or new weaknesses Behavioral/Psych: Mood is stable, no new changes  Extremities: No lower extremity edema Breast: Left lumpectomy All other systems were reviewed with the patient and are negative.  I have reviewed the past medical history, past surgical history, social history and family history with the patient and they are unchanged from  previous note.  ALLERGIES:  is allergic to levaquin [levofloxacin in d5w]; ppa-mma express [compleat]; zithromax [azithromycin]; latex; and ultram [tramadol hcl].  MEDICATIONS:  Current Outpatient Medications  Medication Sig Dispense Refill  . clonazePAM (KLONOPIN) 1 MG tablet TAKE 4 TABLETS BY MOUTH AT BEDTIME 360 tablet 1  . cyclobenzaprine (FLEXERIL) 5 MG tablet TAKE 1 TO 2 TABLETS AT BEDTIME 60 tablet 11  . oxyCODONE (OXY IR/ROXICODONE) 5 MG immediate release tablet Take 1-2 tablets (5-10 mg total) by mouth every 6 (six) hours as needed. 20 tablet 0  . tiZANidine (ZANAFLEX) 4 MG tablet TAKE 2 TABLETS (8 MG TOTAL) BY MOUTH AT BEDTIME. 180 tablet 3   No current facility-administered medications for this visit.     PHYSICAL EXAMINATION: ECOG PERFORMANCE STATUS: 1 - Symptomatic but completely ambulatory  Vitals:   04/08/17 1413  BP: (!) 152/81  Pulse: 81  Resp: 18  Temp: 98.5 F (36.9 C)  SpO2: 100%   Filed Weights   04/08/17 1413  Weight: 167 lb 6.4 oz (75.9 kg)    GENERAL:alert, no distress and comfortable SKIN: skin color, texture, turgor are normal, no rashes or significant lesions EYES: normal, Conjunctiva are pink and non-injected, sclera clear OROPHARYNX:no exudate, no erythema and lips, buccal mucosa, and tongue normal  NECK: supple, thyroid normal size, non-tender, without nodularity LYMPH:  no palpable lymphadenopathy in the cervical, axillary or inguinal LUNGS: clear to auscultation and percussion with normal breathing effort HEART: regular rate & rhythm and no murmurs and no lower extremity edema ABDOMEN:abdomen soft, non-tender and normal bowel sounds MUSCULOSKELETAL:no cyanosis of digits and no clubbing  NEURO: alert & oriented x  3 with fluent speech, no focal motor/sensory deficits EXTREMITIES: No lower extremity edema  LABORATORY DATA:  I have reviewed the data as listed CMP Latest Ref Rng & Units 03/23/2017 03/10/2017 01/09/2015  Glucose 65 - 99 mg/dL  191(H) 102 98  BUN 6 - 20 mg/dL _0 Creatinine 0.44 - 1.00 mg/dL 0.79 0.77 0.63  Sodium 135 - 145 mmol/L 138 143 140  Potassium 3.5 - 5.1 mmol/L 4.7 4.2 4.2  Chloride 101 - 111 mmol/L 101 105 105  CO2 22 - 32 mmol/L _1 Calcium 8.9 - 10.3 mg/dL 9.6 9.6 8.6(L)  Total Protein 6.5 - 8.1 g/dL 7.3 7.5 7.4  Total Bilirubin 0.3 - 1.2 mg/dL 1.4(H) 0.6 0.4  Alkaline Phos 38 - 126 U/L 53 60 60  AST 15 - 41 U/L 58(H) 43(H) 98(H)  ALT 14 - 54 U/L 44 35 118(H)    Lab Results  Component Value Date   WBC 8.3 03/23/2017   HGB 15.9 (H) 03/23/2017   HCT 44.6 03/23/2017   MCV 103.5 (H) 03/23/2017   PLT 287 03/23/2017   NEUTROABS 3.3 03/10/2017    ASSESSMENT & PLAN:  Malignant neoplasm of upper-outer quadrant of left breast in female, estrogen receptor positive ([facility]) 03/31/2017:Left lumpectomy: IDC 0.5 cm, grade 3, DCIS high-grade, margins negative, 0/2 lymph nodes negative, ER 100%, PR 20%, Ki-67 10%, HER-2 positive ratio 2.96 T1 a N0 stage I a  Pathology counseling: I discussed the final pathology report of the patient provided  a copy of this report. I discussed the margins as well as lymph node surgeries. We also discussed the final staging along with previously performed ER/PR and HER-2/neu testing.  Recommendation: 1. adjuvant radiation therapy followed by 2. Adjuvant antiestrogen therapy  Return to clinic after radiation to start antiestrogen therapy.  We discussed different treatment options including tamoxifen versus letrozole therapy.  Patient apparently has osteoporosis and has had a fractures of the bones in the past.  I would like to see her most recent bone density test done by her primary care physician.  The bone density is extremely poor then we may have to consider using tamoxifen.  If the bone density is mild osteoporosis then we may be able to get away with Fosamax and vitamin D.  I spent 25 minutes talking to the patient of which more than half was spent in counseling  and coordination of care.  No orders of the defined types were placed in this encounter.  The patient has a good understanding of the overall plan. she agrees with it. she will call with any problems that may develop before the next visit here.   Harriette Ohara, MD 04/08/17

## 2017-04-22 NOTE — Progress Notes (Signed)
Location of Breast Cancer: Left Breast  Histology per Pathology Report:  03/04/17 Diagnosis Breast, left, needle core biopsy, 2 o'clock - INVASIVE DUCTAL CARCINOMA. - DUCTAL CARCINOMA IN SITU WITH CALCIFICATIONS.  Receptor Status: ER(100%), PR (20%), Her2-neu (POS), Ki-(10%)  03/31/17 Diagnosis 1. Breast, lumpectomy, Left w/seed - INVASIVE DUCTAL CARCINOMA, 0.5 CM, GRADE 3 OF 3 - DUCTAL CARCINOMA IN SITU, HIGH NUCLEAR GRADE - THE TUMOR IS LESS THAN 0.1 CM FROM THE INFEREIOR RESECTION EDGE (THE FINAL INFERIOR RESECTION MARGIN IS REPRESENTED BY PART 2, WHICH IS NEGATIVE FOR IN-SITU OR INVASIVE CARCINOMA) - SEE ONCOLOGY TABLE 2. Breast, excision, Left additional Inferior Margin - BENIGN BREAST TISSUE, NEGATIVE FOR IN-SITU OR INVASIVE CARCINOMA 3. Lymph node, sentinel, biopsy, Left Axillary #1 - LYMPH NODE, NEGATIVE FOR CARCINOMA (0/1) 4. Lymph node, sentinel, biopsy, Left Axillary #2 - LYMPH NODE, NEGATIVE FOR CARCINOMA (0/1)  Did patient present with symptoms or was this found on screening mammography?:  She presented for a CT scan on 02/19/17 given her history of pulmonary nodules for 3 years and history of being a former smoker for 2 years. CT scan showed the 3 mm nodule within the right middle lobe is stable; however, a 6 mm mass within the lateral portion of the left breast was noted.  Past/Anticipated interventions by surgeon, if any: 03/31/17 Dr. Barry Dienes Left Breast Radioactive seed localized lumpectomy and sentinel lymph node biopsy   Past/Anticipated interventions by medical oncology, if any:  04/08/17 Dr. Lindi Adie Recommendation: 1. adjuvant radiation therapy followed by 2. Adjuvant antiestrogen therapy   Lymphedema issues, if any:  She reports lymphedema to her Left Breast and Left nipple.   Pain issues, if any:  She reports pain to her Left Breast surgery site. She also has chronic pain from her MS.   SAFETY ISSUES:  Prior radiation? No  Pacemaker/ICD?  No  Possible current pregnancy? No  Is the patient on methotrexate? No  Current Complaints / other details:    BP (!) 151/93   Pulse 85   Temp 98.7 F (37.1 C)   Resp 20   Ht '5\' 4"'  (1.626 m)   Wt 153 lb (69.4 kg)   SpO2 99% Comment: room air  BMI 26.26 kg/m    Wt Readings from Last 3 Encounters:  04/27/17 153 lb (69.4 kg)  04/08/17 167 lb 6.4 oz (75.9 kg)  03/23/17 166 lb (75.3 kg)      Jesyca Weisenburger, Stephani Police, RN 04/22/2017,9:59 AM

## 2017-04-27 ENCOUNTER — Ambulatory Visit
Admission: RE | Admit: 2017-04-27 | Discharge: 2017-04-27 | Disposition: A | Discharge status: Home / Self Care | Payer: 59 | Source: Ambulatory Visit | Attending: Radiation Oncology | Admitting: Radiation Oncology

## 2017-04-27 ENCOUNTER — Encounter: Payer: Self-pay | Admitting: Radiation Oncology

## 2017-04-27 ENCOUNTER — Other Ambulatory Visit: Payer: Self-pay

## 2017-04-27 VITALS — BP 151/93 | HR 85 | Temp 98.7°F | Resp 20 | Ht 64.0 in | Wt 153.0 lb

## 2017-04-27 DIAGNOSIS — I890 Lymphedema, not elsewhere classified: Secondary | ICD-10-CM | POA: Insufficient documentation

## 2017-04-27 DIAGNOSIS — Z170 Estrogen receptor positive status [ER+]: Principal | ICD-10-CM

## 2017-04-27 DIAGNOSIS — C50412 Malignant neoplasm of upper-outer quadrant of left female breast: Secondary | ICD-10-CM | POA: Diagnosis not present

## 2017-04-27 DIAGNOSIS — G35 Multiple sclerosis: Secondary | ICD-10-CM | POA: Diagnosis not present

## 2017-04-27 DIAGNOSIS — C773 Secondary and unspecified malignant neoplasm of axilla and upper limb lymph nodes: Secondary | ICD-10-CM | POA: Diagnosis not present

## 2017-04-27 DIAGNOSIS — G893 Neoplasm related pain (acute) (chronic): Secondary | ICD-10-CM | POA: Diagnosis not present

## 2017-04-27 DIAGNOSIS — Z9889 Other specified postprocedural states: Secondary | ICD-10-CM | POA: Diagnosis not present

## 2017-04-27 HISTORY — DX: Age-related osteoporosis without current pathological fracture: M81.0

## 2017-04-27 NOTE — Progress Notes (Signed)
Radiation Oncology         [phone] ________________________________  Name: Taylor Hancock MRN: [id number]  Date: 04/27/2017  DOB: [birth year]  Follow-Up Visit Note  Outpatient  CC: Shirline Frees, MD  Stark Klein, MD  Diagnosis:      ICD-10-CM   1. Malignant neoplasm of upper-outer quadrant of left breast in female, estrogen receptor positive ([place]) C50.412    Z17.0     Stage clinical 1a (T1a N0 M0, pT1aN0, grade III) Left Breast UOQ Invasive Ductal Carcinoma with DCIS, ER+ / PR+ / Her2+  CHIEF COMPLAINT: Here to discuss management of her left breast cancer  Narrative:  The patient returns today for follow-up.     Since consultation, she underwent left breast lumpectomy with sentinel node biopsy on 03/31/17 by Dr. Barry Dienes. Pathology showed invasive ductal carcinoma, 0.5 x 0.5 x 0.5 cm, Grade 3 as well as DCIS, High grade. The carcinoma came less than 0.1 cm from the inferior resection edge. Additional resection resulted in all margins clear by >2 mm. 0/2 left axillary sentinel nodes were positive for malignancy.  The patient met with Dr. Lindi Adie on 04/08/17 and due to the small size of her tumor, it was felt that adjuvant chemotherapy would have only a small benefit. She is opting out of chemotherapy, but will eventually begin antiestrogen therapy.  On review of systems, the patient endorses lymphedema to her left breast and left nipple. She reports of pain to her left breast at surgery site and also endorses chronic pain resulting from [place]. She is concerned for daily transportation to radiation therapy. She endorses diminished endurance secondary to MS.           ALLERGIES:  is allergic to levaquin [levofloxacin in d5w]; ppa-mma express [compleat]; zithromax [azithromycin]; latex; and ultram [tramadol hcl].  Meds: Current Outpatient Medications  Medication Sig Dispense Refill  . calcium citrate-vitamin D (CITRACAL+D) 315-200 MG-UNIT tablet Take 1 tablet by mouth 2 (two) times  daily.    . cholecalciferol (VITAMIN D) 1000 units tablet Take 1,000 Units by mouth daily. Pt reports 2000 UNITS daily, somedays up to 8000 UNITS.    Marland Kitchen clonazePAM (KLONOPIN) 1 MG tablet TAKE 4 TABLETS BY MOUTH AT BEDTIME 360 tablet 1  . cyclobenzaprine (FLEXERIL) 5 MG tablet TAKE 1 TO 2 TABLETS AT BEDTIME 60 tablet 11  . Omega-3 Fatty Acids (FISH OIL) 1000 MG CAPS Take by mouth.    Marland Kitchen tiZANidine (ZANAFLEX) 4 MG tablet TAKE 2 TABLETS (8 MG TOTAL) BY MOUTH AT BEDTIME. 180 tablet 3  . oxyCODONE (OXY IR/ROXICODONE) 5 MG immediate release tablet Take 1-2 tablets (5-10 mg total) by mouth every 6 (six) hours as needed. (Patient not taking: Reported on 04/27/2017) 20 tablet 0   No current facility-administered medications for this encounter.     Physical Findings:  height is '5\' 4"'  (1.626 m) and weight is 153 lb (69.4 kg). Her temperature is 98.7 F (37.1 C). Her blood pressure is 151/93 (abnormal) and her pulse is 85. Her respiration is 20 and oxygen saturation is 99%. .     General: Alert and oriented, in no acute distress Skin: She has erythematous spots over her chest bilaterally consistent w/ dermatitis.  Musculoskeletal: She presents in a wheelchair. Psychiatric: Judgment and insight are intact. Affect is appropriate. Breast exam reveals she has diffuse erythema and moderate swelling in the left breast. Left breast is slightly warm. I do not believe there is an infection; she states this is improving since surgery.  Lab Findings: Lab Results  Component Value Date   WBC 8.3 03/23/2017   HGB 15.9 (H) 03/23/2017   HCT 44.6 03/23/2017   MCV 103.5 (H) 03/23/2017   PLT 287 03/23/2017    '@LASTCHEMISTRY' @  Radiographic Findings: Nm Sentinel Node Inj-no Rpt (breast)  Result Date: 03/31/2017 Sulfur colloid was injected by the nuclear medicine technologist for melanoma sentinel node.   Mm Breast Surgical Specimen  Result Date: 03/31/2017 CLINICAL DATA:  57 year old patient had preoperative  radioactive seed localization of a biopsy-proven left breast cancer. EXAM: SPECIMEN RADIOGRAPH OF THE LEFT BREAST COMPARISON:  Previous exam(s). FINDINGS: Status post excision of the left breast. The radioactive seed and biopsy marker clip are present, completely intact, and were marked for pathology. IMPRESSION: Specimen radiograph of the left breast. Electronically Signed   By: Curlene Dolphin M.D.   On: 03/31/2017 09:44   Mm Lt Radioactive Seed Loc Mammo Guide  Result Date: 03/29/2017 CLINICAL DATA:  57 year old female presents for preoperative radioactive seed placement for left breast mass. EXAM: MAMMOGRAPHIC GUIDED RADIOACTIVE SEED LOCALIZATION OF THE LEFT BREAST COMPARISON:  Previous exam(s). FINDINGS: Patient presents for radioactive seed localization prior to left breast lumpectomy. I met with the patient and we discussed the procedure of seed localization including benefits and alternatives. We discussed the high likelihood of a successful procedure. We discussed the risks of the procedure including infection, bleeding, tissue injury and further surgery. We discussed the low dose of radioactivity involved in the procedure. Informed, written consent was given. The usual time-out protocol was performed immediately prior to the procedure. Using mammographic guidance, sterile technique, 1% lidocaine and an I-125 radioactive seed, the mass and associated ribbon shaped biopsy marking clip was localized using a lateral to medial approach. The follow-up mammogram images confirm the seed in the expected location and were marked for Dr. Barry Dienes. Follow-up survey of the patient confirms presence of the radioactive seed. Order number of I-125 seed:  [id number]. Total activity:  2.831 millicuries reference Date: 03/22/2017 The patient tolerated the procedure well and was released from the [place]. She was given instructions regarding seed removal. IMPRESSION: Radioactive seed localization left breast. No apparent  complications. Electronically Signed   By: Everlean Alstrom M.D.   On: 03/29/2017 15:05    Impression/Plan: Left breast cancer We discussed adjuvant radiotherapy today.  I recommend radiation therapy to her left breast in order to reduce her risk of locoregional recurrence by 2/3s.  The risks, benefits and side effects of this treatment were discussed in detail.  She understands that radiotherapy is associated with skin irritation and fatigue in the acute setting. Late effects can include cosmetic changes and rare injury to internal organs.   She is enthusiastic about proceeding with treatment. A consent form has been signed and placed in her chart. Will simulate in a couple weeks for further healing.   A total of 3 medically necessary complex treatment devices will be fabricated and supervised by me: 2 fields with MLCs for custom blocks to protect heart, and lungs;  and, a Vac-lok. MORE COMPLEX DEVICES MAY BE MADE IN DOSIMETRY FOR FIELD IN FIELD BEAMS FOR DOSE HOMOGENEITY.  I have requested : 3D Simulation which is medically necessary to give adequate dose to at risk tissues while sparing lungs and heart.  I have requested a DVH of the following structures: lungs, heart, left lumpectomy cavity.    The patient will receive 40.05 Gy in 15 fractions to the left breast with 2 fields.  This will  be followed by a boost.  I spent 25 minutes minutes face to face with the patient and more than 50% of that time was spent in counseling and/or coordination of care.    _____________________________________   Eppie Gibson, MD   This document serves as a record of services personally performed by Eppie Gibson, MD. It was created on his behalf by Linward Natal, a trained medical scribe. The creation of this record is based on the scribe's personal observations and the provider's statements to them. This document has been checked and approved by the attending provider.

## 2017-04-29 ENCOUNTER — Ambulatory Visit: Payer: 59 | Attending: General Surgery | Admitting: Physical Therapy

## 2017-04-29 ENCOUNTER — Encounter: Payer: Self-pay | Admitting: Physical Therapy

## 2017-04-29 ENCOUNTER — Other Ambulatory Visit: Payer: Self-pay

## 2017-04-29 DIAGNOSIS — R293 Abnormal posture: Secondary | ICD-10-CM | POA: Insufficient documentation

## 2017-04-29 DIAGNOSIS — Z170 Estrogen receptor positive status [ER+]: Secondary | ICD-10-CM | POA: Insufficient documentation

## 2017-04-29 DIAGNOSIS — R600 Localized edema: Secondary | ICD-10-CM | POA: Insufficient documentation

## 2017-04-29 DIAGNOSIS — C50412 Malignant neoplasm of upper-outer quadrant of left female breast: Secondary | ICD-10-CM | POA: Insufficient documentation

## 2017-04-29 DIAGNOSIS — M6281 Muscle weakness (generalized): Secondary | ICD-10-CM | POA: Diagnosis present

## 2017-04-29 DIAGNOSIS — Z483 Aftercare following surgery for neoplasm: Secondary | ICD-10-CM | POA: Diagnosis present

## 2017-04-29 NOTE — Therapy (Addendum)
[place], [place], [address] Phone: [phone]   Fax:  [phone]  Physical Therapy Evaluation  Patient Details  Name: Taylor Hancock MRN: [id number] Date of Birth: 05-15-1960 Referring Provider: Dr. Stark Klein   Encounter Date: 04/29/2017  PT End of Session - 04/29/17 1048    Visit Number  2    Number of Visits  9    Date for PT Re-Evaluation  05/27/17    PT Start Time  1010    PT Stop Time  1102    PT Time Calculation (min)  52 min    Activity Tolerance  Patient tolerated treatment well    Behavior During Therapy  [facility] for tasks assessed/performed       Past Medical History:  Diagnosis Date  . Anxiety   . Complication of anesthesia    patient was awake during colonoscopy as drugs were not effective-reports that propofol is an effective analgesic for her  . Concussion   . Depression   . Family history of breast cancer   . Family history of ovarian cancer   . Family history of pancreatic cancer   . Fibromyalgia   . Movement disorder   . MS (multiple sclerosis) ([place])    primary progressive MS  . Multiple sclerosis ([place])   . Neuropathy   . Osteoporosis   . Vision abnormalities     Past Surgical History:  Procedure Laterality Date  . BREAST LUMPECTOMY WITH RADIOACTIVE SEED AND SENTINEL LYMPH NODE BIOPSY Left 03/31/2017   Procedure: BREAST LUMPECTOMY WITH RADIOACTIVE SEED AND SENTINEL LYMPH NODE BIOPSY;  Surgeon: Stark Klein, MD;  Location: [facility];  Service: General;  Laterality: Left;  . FRACTURE SURGERY Left    hand  . I&D EXTREMITY  05/30/2011   Procedure: IRRIGATION AND DEBRIDEMENT EXTREMITY;  Surgeon: Tennis Must, MD;  Location: [facility];  Service: Orthopedics;;  Incision and drainage of open Proximal interphalangeal joint with closed reduction of Proximal interphalangeal  joint left long finger  . KNEE ARTHROSCOPY      There were no vitals filed for this visit.   Subjective  Assessment - 04/29/17 1023    Subjective  Patient underwent a left lumpectomy and sentinel node biopsy (0/2 nodes) on 03/31/17. She has no need for chemo but will undergo radiation and anti-estrogen therapy. She has MS which makes fatigue worse.    Pertinent History  Patient was diagnosed on 02/19/17 with left triple positive grade 2 invasive ductal carcinoma breast cancer. It measured 4 mm and was located in the upper outer quadrant with a Ki67 of 10%. She also has a history of osteoporosis and has progressive multiple sclerosis and uses a rolling walker to ambulate. She also has fibromyalgia. She reports a history of multiple concussions due to falls. Patient underwent a left lumpectomy and sentinel node biopsy (0/2 nodes) on 03/31/17.     Patient Stated Goals  See why my breast is swollen    Currently in Pain?  Yes    Pain Score  8     Pain Location  Breast Nipple    Pain Orientation  Left    Pain Descriptors / Indicators  Tender Hot    Pain Type  Surgical pain    Pain Onset  1 to 4 weeks ago    Pain Frequency  Constant    Aggravating Factors   Touching breast    Pain Relieving Factors  Meds  [facility] PT Assessment - 04/29/17 0001      Assessment   Medical Diagnosis  s/p left lumpectomy and SLNB    Referring Provider  Dr. Stark Klein    Onset Date/Surgical Date  03/31/17    Hand Dominance  Right    Prior Therapy  Baseline assessment      Precautions   Precautions  Other (comment)    Precaution Comments  MS; recent breast surgery      Restrictions   Weight Bearing Restrictions  No      Home Environment   Living Environment  Private residence    Living Arrangements  Spouse/significant other    Available Help at Discharge  Family      Prior Function   Level of Independence  Independent with household mobility with device;Independent with community mobility with device High fall risk even with walker    Vocation  On disability    Leisure  Has not been exercising       Cognition   Overall Cognitive Status  Difficult to assess    Difficult to assess due to  -- Cognitive issues due to MS      Observation/Other Assessments   Observations  Some redness present left breast with edema (photo taken) mostly in her inferior breast.      Sensation   Additional Comments  Poor sensation in hands and feet due to peripheral neuropathy from MS      Posture/Postural Control   Posture/Postural Control  Postural limitations    Postural Limitations  Rounded Shoulders;Forward head      ROM / Strength   AROM / PROM / Strength  AROM      AROM   AROM Assessment Site  Shoulder    Right/Left Shoulder  Left    Left Shoulder Extension  41 Degrees    Left Shoulder Flexion  131 Degrees    Left Shoulder ABduction  153 Degrees    Left Shoulder Internal Rotation  57 Degrees    Left Shoulder External Rotation  77 Degrees    Cervical Flexion  WNL    Cervical Extension  Stopped cervical testing due to c/o dizziness      Ambulation/Gait   Gait Comments  Ambulates with rolling walker due to MS        LYMPHEDEMA/ONCOLOGY QUESTIONNAIRE - 04/29/17 1033      Type   Cancer Type  Left breast      Surgeries   Lumpectomy Date  03/31/17    Sentinel Lymph Node Biopsy Date  03/31/17    Number Lymph Nodes Removed  2      Treatment   Active Chemotherapy Treatment  No    Past Chemotherapy Treatment  No    Active Radiation Treatment  No    Past Radiation Treatment  No    Current Hormone Treatment  No    Past Hormone Therapy  No      What other symptoms do you have   Are you Having Heaviness or Tightness  Yes    Are you having Pain  Yes    Are you having pitting edema  No    Is it Hard or Difficult finding clothes that fit  No    Do you have infections  No    Is there Decreased scar mobility  Yes    [place] Sign  No      Lymphedema Assessments   Lymphedema Assessments  Upper extremities      Right Upper  Extremity Lymphedema   10 cm Proximal to Olecranon Process  30.6  cm    Olecranon Process  25.9 cm    10 cm Proximal to Ulnar Styloid Process  22 cm    Just Proximal to Ulnar Styloid Process  15.9 cm    Across Hand at [employer]  18.5 cm    At [place] of 2nd Digit  6.5 cm      Left Upper Extremity Lymphedema   10 cm Proximal to Olecranon Process  30.1 cm    Olecranon Process  26 cm    10 cm Proximal to Ulnar Styloid Process  22.7 cm    Just Proximal to Ulnar Styloid Process  15.4 cm    Across Hand at [employer]  18.7 cm    At [place] of 2nd Digit  6.2 cm            Quick Dash - 04/29/17 0001    Open a tight or new jar  Moderate difficulty    Do heavy household chores (wash walls, wash floors)  Unable    Carry a shopping bag or briefcase  Severe difficulty    Wash your back  Unable    Use a knife to cut food  Moderate difficulty    Recreational activities in which you take some force or impact through your arm, shoulder, or hand (golf, hammering, tennis)  Unable    During the past week, to what extent has your arm, shoulder or hand problem interfered with your normal social activities with family, friends, neighbors, or groups?  Not at all    During the past week, to what extent has your arm, shoulder or hand problem limited your work or other regular daily activities  Modererately    Arm, shoulder, or hand pain.  Moderate    Tingling (pins and needles) in your arm, shoulder, or hand  Moderate    Difficulty Sleeping  Severe difficulty    DASH Score  63.64 %        Objective measurements completed on examination: See above findings.      [place] Adult PT Treatment/Exercise - 04/29/17 0001      Manual Therapy   Manual therapy comments  Applied compression foam 1/2" to inferior left breast inside bra to reduce swelling.             PT Education - 04/29/17 1046    Education provided  Yes    Education Details  Breast lymphedema - causes, treatment    Person(s) Educated  Patient    Methods  Explanation    Comprehension   Verbalized understanding          PT Long Term Goals - 04/29/17 1146      PT LONG TERM GOAL #1   Title  Patient demonstrates she has returned to baseline related to shoulder ROM and function.    Time  8    Period  Weeks    Status  Achieved      PT LONG TERM GOAL #2   Title  Patient will verbalize understanding of self care for left breast lymphedema including self manual lymph drainage and appropriate compression garment.    Time  4    Period  Weeks    Status  New      PT LONG TERM GOAL #3   Title  Patient will report >/= 25% improvement in left breast swelling and pain.    Time  4  Period  Weeks    Status  New      PT LONG TERM GOAL #4   Title  Patient will score </= 40 on Quick DASH to allow for improved upper extremity function.    Baseline  63.64    Time  4    Period  Weeks    Status  New      Breast Clinic Goals - 03/10/17 1409      Patient will be able to verbalize understanding of pertinent lymphedema risk reduction practices relevant to her diagnosis specifically related to skin care.   Time  1    Period  Days    Status  Achieved      Patient will be able to return demonstrate and/or verbalize understanding of the post-op home exercise program related to regaining shoulder range of motion.   Time  1    Period  Days    Status  Achieved      Patient will be able to verbalize understanding of the importance of attending the postoperative After Breast Cancer Class for further lymphedema risk reduction education and therapeutic exercise.   Time  1    Period  Days    Status  Achieved       PLAN: Patient is s/p left lumpectomy and SLNB. She has no need for chemo but will undergo radiation. She has some breast swelling which would benefit from PT and manual lymph drainage. She also has some shoulder limitations but that is complicated by fibromyalgia and MS.   2x/week for 4 weeks   Patient will benefit from skilled therapeutic intervention in order to  improve on the following deficits: Decreased knowledge of precautions, decreased knowledge of DME, pain, range of motion, fascial restrictions,increased edema, impaired UE functional use, decreased strength, postural dysfunction.  PT Interventions: Therapeutic exercise, patient education, manual lymph drainage, manual techniques, orthotic fit/training, self care/home management, passive range of motion, scar mobilization, taping.     Patient will benefit from skilled therapeutic intervention in order to improve the followin deficits and impairments:     Visit Diagnosis: Malignant neoplasm of upper-outer quadrant of left breast in female, estrogen receptor positive ([place]) - Plan: PT plan of care cert/re-cert  Abnormal posture - Plan: PT plan of care cert/re-cert  Muscle weakness (generalized) - Plan: PT plan of care cert/re-cert  Aftercare following surgery for neoplasm - Plan: PT plan of care cert/re-cert  Localized edema - Plan: PT plan of care cert/re-cert     Problem List Patient Active Problem List   Diagnosis Date Noted  . Malignant neoplasm of upper-outer quadrant of left breast in female, estrogen receptor positive ([place]) 03/09/2017  . Urinary hesitancy 01/27/2017  . Mild cognitive disorder 06/09/2016  . Genetic testing 12/17/2015  . Family history of breast cancer   . Family history of ovarian cancer   . Family history of pancreatic cancer   . Insomnia 06/10/2015  . Urinary frequency 06/10/2015  . Frequent falls 12/26/2014  . Snoring 12/26/2014  . Depression 08/01/2014  . Other fatigue 08/01/2014  . Multiple sclerosis, primary progressive ([place]) 02/07/2014  . Spastic gait 02/07/2014  . 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 Numbness 02/07/2014  . Foot drop, left 02/07/2014    Annia Friendly, PT 04/29/17 11:52 AM  [place], [place], [address] Phone: [phone]   Fax:   [phone]  Name: PATRENA SANTALUCIA MRN: [id number] Date of Birth: 09-07-1960

## 2017-05-04 ENCOUNTER — Ambulatory Visit: Payer: 59 | Admitting: Physical Therapy

## 2017-05-04 DIAGNOSIS — C50412 Malignant neoplasm of upper-outer quadrant of left female breast: Secondary | ICD-10-CM

## 2017-05-04 DIAGNOSIS — R600 Localized edema: Secondary | ICD-10-CM

## 2017-05-04 DIAGNOSIS — Z170 Estrogen receptor positive status [ER+]: Principal | ICD-10-CM

## 2017-05-04 DIAGNOSIS — Z483 Aftercare following surgery for neoplasm: Secondary | ICD-10-CM

## 2017-05-04 NOTE — Therapy (Signed)
[place], [place], [address] Phone: [phone]   Fax:  [phone]  Physical Therapy Treatment  Patient Details  Name: Taylor Hancock MRN: [id number] Date of Birth: 20-Nov-1960 Referring Provider: Dr. Stark Klein   Encounter Date: 05/04/2017  PT End of Session - 05/04/17 1720    Visit Number  3    Number of Visits  9    Date for PT Re-Evaluation  05/27/17    PT Start Time  5465    PT Stop Time  1519    PT Time Calculation (min)  46 min    Activity Tolerance  Patient tolerated treatment well    Behavior During Therapy  [facility] for tasks assessed/performed       Past Medical History:  Diagnosis Date  . Anxiety   . Complication of anesthesia    patient was awake during colonoscopy as drugs were not effective-reports that propofol is an effective analgesic for her  . Concussion   . Depression   . Family history of breast cancer   . Family history of ovarian cancer   . Family history of pancreatic cancer   . Fibromyalgia   . Movement disorder   . MS (multiple sclerosis) ([place])    primary progressive MS  . Multiple sclerosis ([place])   . Neuropathy   . Osteoporosis   . Vision abnormalities     Past Surgical History:  Procedure Laterality Date  . BREAST LUMPECTOMY WITH RADIOACTIVE SEED AND SENTINEL LYMPH NODE BIOPSY Left 03/31/2017   Procedure: BREAST LUMPECTOMY WITH RADIOACTIVE SEED AND SENTINEL LYMPH NODE BIOPSY;  Surgeon: Stark Klein, MD;  Location: [place];  Service: General;  Laterality: Left;  . FRACTURE SURGERY Left    hand  . I&D EXTREMITY  05/30/2011   Procedure: IRRIGATION AND DEBRIDEMENT EXTREMITY;  Surgeon: Tennis Must, MD;  Location: [facility];  Service: Orthopedics;;  Incision and drainage of open Proximal interphalangeal joint with closed reduction of Proximal interphalangeal  joint left long finger  . KNEE ARTHROSCOPY      There were no vitals filed for this visit.  Subjective  Assessment - 05/04/17 1437    Subjective  I don't think it's perspiration, but it's like there's a smell there (left breast).  "I don't think it's quite as bad aswhen I saw [name], but the areola is still larger." Pt. has a sports bra that she says does provide some compression.    Pertinent History  Patient was diagnosed on 02/19/17 with left triple positive grade 2 invasive ductal carcinoma breast cancer. It measured 4 mm and was located in the upper outer quadrant with a Ki67 of 10%. She also has a history of osteoporosis and has progressive multiple sclerosis and uses a rolling walker to ambulate. She also has fibromyalgia. She reports a history of multiple concussions due to falls. Patient underwent a left lumpectomy and sentinel node biopsy (0/2 nodes) on 03/31/17.     Currently in Pain?  Yes    Pain Score  4     Pain Location  Breast    Pain Orientation  Left    Pain Descriptors / Indicators  Other (Comment);Tender like carrying a softball around    Aggravating Factors   sleeping on right side, the weight of the breast pulls    Pain Relieving Factors  a supportive bra  [facility] Adult PT Treatment/Exercise - 05/04/17 0001      Self-Care   Self-Care  Other Self-Care Comments    Other Self-Care Comments   Pt. wears an [employer] bra today, which does appear to compress her breasts a bit; the lateral aspect, however, is not snug      Manual Therapy   Manual Therapy  Soft tissue mobilization;Manual Lymphatic Drainage (MLD)    Soft tissue mobilization  scar mobilization at left upper outer breast scar    Manual Lymphatic Drainage (MLD)  Instructed patient in basics of principles and techniques of manual lymph drainage.  In supine, performed the following: short neck, superficial and deep abdomen, right axilla and anterior interaxillary anastomosis, left groin and axillo-inguinal anastomosis, and left breast, directing fluid toward pathways.                   PT Long Term Goals - 04/29/17 1146      PT LONG TERM GOAL #1   Title  Patient demonstrates she has returned to baseline related to shoulder ROM and function.    Time  8    Period  Weeks    Status  Achieved      PT LONG TERM GOAL #2   Title  Patient will verbalize understanding of self care for left breast lymphedema including self manual lymph drainage and appropriate compression garment.    Time  4    Period  Weeks    Status  New      PT LONG TERM GOAL #3   Title  Patient will report >/= 25% improvement in left breast swelling and pain.    Time  4    Period  Weeks    Status  New      PT LONG TERM GOAL #4   Title  Patient will score </= 40 on Quick DASH to allow for improved upper extremity function.    Baseline  63.64    Time  4    Period  Weeks    Status  New      Breast Clinic Goals - 03/10/17 1409      Patient will be able to verbalize understanding of pertinent lymphedema risk reduction practices relevant to her diagnosis specifically related to skin care.   Time  1    Period  Days    Status  Achieved      Patient will be able to return demonstrate and/or verbalize understanding of the post-op home exercise program related to regaining shoulder range of motion.   Time  1    Period  Days    Status  Achieved      Patient will be able to verbalize understanding of the importance of attending the postoperative After Breast Cancer Class for further lymphedema risk reduction education and therapeutic exercise.   Time  1    Period  Days    Status  Achieved           Plan - 05/04/17 1720    Clinical Impression Statement  Pt. tolerated initial manual lymph drainage treatment well today. She described smelling an odor from her left chest area that she said was not from perspiration, and seemed concerned about it.  This therapist did not note an unusual odor.    Rehab Potential  Good    Clinical Impairments Affecting Rehab Potential  Above  stated comorbidities    PT Frequency  2x / week    PT Duration  4 weeks  PT Treatment/Interventions  ADLs/Self Care Home Management;DME Instruction;Therapeutic exercise;Patient/family education;Orthotic Fit/Training;Manual techniques;Manual lymph drainage;Scar mobilization;Passive range of motion;Taping    PT Next Visit Plan  Discuss lymphedema pump option at next visit. Try instructing in self-manual lymph drainage--patient is not sure she will be able to do it because of numbness in her fingers from MS.    PT Home Exercise Plan  post op shoulder ROM HEP    Consulted and Agree with Plan of Care  Patient       Patient will benefit from skilled therapeutic intervention in order to improve the following deficits and impairments:  Decreased knowledge of precautions, Impaired UE functional use, Decreased range of motion, Decreased strength, Postural dysfunction, Pain, Decreased knowledge of use of DME, Increased edema  Visit Diagnosis: Malignant neoplasm of upper-outer quadrant of left breast in female, estrogen receptor positive ([place])  Localized edema  Aftercare following surgery for neoplasm     Problem List Patient Active Problem List   Diagnosis Date Noted  . Malignant neoplasm of upper-outer quadrant of left breast in female, estrogen receptor positive ([place]) 03/09/2017  . Urinary hesitancy 01/27/2017  . Mild cognitive disorder 06/09/2016  . Genetic testing 12/17/2015  . Family history of breast cancer   . Family history of ovarian cancer   . Family history of pancreatic cancer   . Insomnia 06/10/2015  . Urinary frequency 06/10/2015  . Frequent falls 12/26/2014  . Snoring 12/26/2014  . Depression 08/01/2014  . Other fatigue 08/01/2014  . Multiple sclerosis, primary progressive ([place]) 02/07/2014  . Spastic gait 02/07/2014  . 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 Numbness 02/07/2014  . Foot drop, left 02/07/2014    Nathanie Ottley 05/04/2017, 5:24 PM  [place], [place], [address] Phone: [phone]   Fax:  [phone]  Name: Taylor Hancock MRN: [id number] Date of Birth: [birth year]  Serafina Royals, PT 05/04/17 5:24 PM

## 2017-05-06 ENCOUNTER — Encounter: Payer: Self-pay | Admitting: Physical Therapy

## 2017-05-06 ENCOUNTER — Ambulatory Visit: Payer: 59 | Admitting: Physical Therapy

## 2017-05-06 DIAGNOSIS — R600 Localized edema: Secondary | ICD-10-CM

## 2017-05-06 DIAGNOSIS — C50412 Malignant neoplasm of upper-outer quadrant of left female breast: Secondary | ICD-10-CM | POA: Diagnosis not present

## 2017-05-06 NOTE — Therapy (Signed)
[place], [place], [address] Phone: [phone]   Fax:  [phone]  Physical Therapy Treatment  Patient Details  Name: Taylor Hancock MRN: [id number] Date of Birth: 1960-03-12 Referring Provider: Dr. Stark Klein   Encounter Date: 05/06/2017  PT End of Session - 05/06/17 1521    Visit Number  4    Number of Visits  9    Date for PT Re-Evaluation  05/27/17    PT Start Time  2248    PT Stop Time  1519    PT Time Calculation (min)  48 min    Activity Tolerance  Patient tolerated treatment well    Behavior During Therapy  [facility] for tasks assessed/performed       Past Medical History:  Diagnosis Date  . Anxiety   . Complication of anesthesia    patient was awake during colonoscopy as drugs were not effective-reports that propofol is an effective analgesic for her  . Concussion   . Depression   . Family history of breast cancer   . Family history of ovarian cancer   . Family history of pancreatic cancer   . Fibromyalgia   . Movement disorder   . MS (multiple sclerosis) ([place])    primary progressive MS  . Multiple sclerosis ([place])   . Neuropathy   . Osteoporosis   . Vision abnormalities     Past Surgical History:  Procedure Laterality Date  . BREAST LUMPECTOMY WITH RADIOACTIVE SEED AND SENTINEL LYMPH NODE BIOPSY Left 03/31/2017   Procedure: BREAST LUMPECTOMY WITH RADIOACTIVE SEED AND SENTINEL LYMPH NODE BIOPSY;  Surgeon: Stark Klein, MD;  Location: [place];  Service: General;  Laterality: Left;  . FRACTURE SURGERY Left    hand  . I&D EXTREMITY  05/30/2011   Procedure: IRRIGATION AND DEBRIDEMENT EXTREMITY;  Surgeon: Tennis Must, MD;  [facility];  Service: Orthopedics;;  Incision and drainage of open Proximal interphalangeal joint with closed reduction of Proximal interphalangeal  joint left long finger  . KNEE ARTHROSCOPY      There were no vitals filed for this visit.  Subjective  Assessment - 05/06/17 1433    Subjective  When I take my bra off I smell this odor. I just want to make sure it is not infected.     Pertinent History  Patient was diagnosed on 02/19/17 with left triple positive grade 2 invasive ductal carcinoma breast cancer. It measured 4 mm and was located in the upper outer quadrant with a Ki67 of 10%. She also has a history of osteoporosis and has progressive multiple sclerosis and uses a rolling walker to ambulate. She also has fibromyalgia. She reports a history of multiple concussions due to falls. Patient underwent a left lumpectomy and sentinel node biopsy (0/2 nodes) on 03/31/17.     Patient Stated Goals  See why my breast is swollen    Currently in Pain?  Yes    Pain Score  4     Pain Location  Breast    Pain Orientation  Left                       [facility] Adult PT Treatment/Exercise - 05/06/17 0001      Manual Therapy   Manual Therapy  Manual Lymphatic Drainage (MLD);Edema management    Edema Management  talked with pt about a compression pump and bra and pt was going to think about this over the weekend  Manual Lymphatic Drainage (MLD)  In supine, performed the following: short neck, superficial and deep abdomen, right axilla and anterior interaxillary anastomosis, left groin and axillo-inguinal anastomosis, and left breast, directing fluid toward pathways., attempted to have pt demnonstrate technique but due to decreased feeling in her hands she had difficulty stretching the skin                  PT Long Term Goals - 04/29/17 1146      PT LONG TERM GOAL #1   Title  Patient demonstrates she has returned to baseline related to shoulder ROM and function.    Time  8    Period  Weeks    Status  Achieved      PT LONG TERM GOAL #2   Title  Patient will verbalize understanding of self care for left breast lymphedema including self manual lymph drainage and appropriate compression garment.    Time  4    Period  Weeks     Status  New      PT LONG TERM GOAL #3   Title  Patient will report >/= 25% improvement in left breast swelling and pain.    Time  4    Period  Weeks    Status  New      PT LONG TERM GOAL #4   Title  Patient will score </= 40 on Quick DASH to allow for improved upper extremity function.    Baseline  63.64    Time  4    Period  Weeks    Status  New      Breast Clinic Goals - 03/10/17 1409      Patient will be able to verbalize understanding of pertinent lymphedema risk reduction practices relevant to her diagnosis specifically related to skin care.   Time  1    Period  Days    Status  Achieved      Patient will be able to return demonstrate and/or verbalize understanding of the post-op home exercise program related to regaining shoulder range of motion.   Time  1    Period  Days    Status  Achieved      Patient will be able to verbalize understanding of the importance of attending the postoperative After Breast Cancer Class for further lymphedema risk reduction education and therapeutic exercise.   Time  1    Period  Days    Status  Achieved           Plan - 05/06/17 1521    Clinical Impression Statement  Discussed the option of a compression pump with pt. She has the [employer]. I think this would be a good option for the pt because she has difficulty performing proper technique with self MLD due to decreased sensation in her hands. Also pt may benefit from a compression bra. Pt is still stating she notices an unusual odor when she removes her bra but therapist did not notice this today. Continued with MLD to left breast. Pt feels her swelling is improving since she began therapy.     Rehab Potential  Good    Clinical Impairments Affecting Rehab Potential  Above stated comorbidities    PT Frequency  2x / week    PT Duration  4 weeks    PT Treatment/Interventions  ADLs/Self Care Home Management;DME Instruction;Therapeutic exercise;Patient/family education;Orthotic  Fit/Training;Manual techniques;Manual lymph drainage;Scar mobilization;Passive range of motion;Taping    PT Next Visit Plan  see if pt  is interested in compression pump, send script for compression bra if pt is interested, continue Trying to  instruct in self-manual lymph drainage--patient is not sure she will be able to do it because of numbness in her fingers from MS.    PT Home Exercise Plan  post op shoulder ROM HEP    Consulted and Agree with Plan of Care  Patient       Patient will benefit from skilled therapeutic intervention in order to improve the following deficits and impairments:  Decreased knowledge of precautions, Impaired UE functional use, Decreased range of motion, Decreased strength, Postural dysfunction, Pain, Decreased knowledge of use of DME, Increased edema  Visit Diagnosis: Localized edema     Problem List Patient Active Problem List   Diagnosis Date Noted  . Malignant neoplasm of upper-outer quadrant of left breast in female, estrogen receptor positive ([place]) 03/09/2017  . Urinary hesitancy 01/27/2017  . Mild cognitive disorder 06/09/2016  . Genetic testing 12/17/2015  . Family history of breast cancer   . Family history of ovarian cancer   . Family history of pancreatic cancer   . Insomnia 06/10/2015  . Urinary frequency 06/10/2015  . Frequent falls 12/26/2014  . Snoring 12/26/2014  . Depression 08/01/2014  . Other fatigue 08/01/2014  . Multiple sclerosis, primary progressive ([place]) 02/07/2014  . Spastic gait 02/07/2014  . Spastic diplegia ([place]) 02/07/2014  . Numbness 02/07/2014  . Foot drop, left 02/07/2014    Allyson Sabal [facility] 05/06/2017, 3:24 PM  [place], [place], [address] Phone: [phone]   Fax:  [phone]  Name: Taylor Hancock MRN: [id number] Date of Birth: [birth year]  Manus Gunning, PT 05/06/17 3:24 PM

## 2017-05-10 ENCOUNTER — Ambulatory Visit: Payer: 59 | Admitting: Physical Therapy

## 2017-05-10 DIAGNOSIS — Z483 Aftercare following surgery for neoplasm: Secondary | ICD-10-CM

## 2017-05-10 DIAGNOSIS — R600 Localized edema: Secondary | ICD-10-CM

## 2017-05-10 DIAGNOSIS — C50412 Malignant neoplasm of upper-outer quadrant of left female breast: Secondary | ICD-10-CM | POA: Diagnosis not present

## 2017-05-10 NOTE — Therapy (Signed)
[place], [place], [address] Phone: [phone]   Fax:  [phone]  Physical Therapy Treatment  Patient Details  Name: Taylor Hancock MRN: [id number] Date of Birth: 1960-10-24 Referring Provider: Dr. Stark Klein   Encounter Date: 05/10/2017  PT End of Session - 05/10/17 1528    Visit Number  5    Number of Visits  9    Date for PT Re-Evaluation  05/27/17    PT Start Time  1446    PT Stop Time  1530    PT Time Calculation (min)  44 min    Activity Tolerance  Patient tolerated treatment well    Behavior During Therapy  [facility] for tasks assessed/performed       Past Medical History:  Diagnosis Date  . Anxiety   . Complication of anesthesia    patient was awake during colonoscopy as drugs were not effective-reports that propofol is an effective analgesic for her  . Concussion   . Depression   . Family history of breast cancer   . Family history of ovarian cancer   . Family history of pancreatic cancer   . Fibromyalgia   . Movement disorder   . MS (multiple sclerosis) ([place])    primary progressive MS  . Multiple sclerosis ([place])   . Neuropathy   . Osteoporosis   . Vision abnormalities     Past Surgical History:  Procedure Laterality Date  . BREAST LUMPECTOMY WITH RADIOACTIVE SEED AND SENTINEL LYMPH NODE BIOPSY Left 03/31/2017   Procedure: BREAST LUMPECTOMY WITH RADIOACTIVE SEED AND SENTINEL LYMPH NODE BIOPSY;  Surgeon: Stark Klein, MD;  Location: [place];  Service: General;  Laterality: Left;  . FRACTURE SURGERY Left    hand  . I&D EXTREMITY  05/30/2011   Procedure: IRRIGATION AND DEBRIDEMENT EXTREMITY;  Surgeon: Tennis Must, MD;  [facility];  Service: Orthopedics;;  Incision and drainage of open Proximal interphalangeal joint with closed reduction of Proximal interphalangeal  joint left long finger  . KNEE ARTHROSCOPY      There were no vitals filed for this visit.  Subjective  Assessment - 05/10/17 1448    Subjective  "My friend who has her doctorate would like me not to have radiation." The breast swelling got a lot better--I don't know if it's what the last therapist did or just time, but it's better." Wants to wait to think about the pump because of how much better it is, and also because of cost. Wearing the compression bra 24/7.    Pertinent History  Patient was diagnosed on 02/19/17 with left triple positive grade 2 invasive ductal carcinoma breast cancer. It measured 4 mm and was located in the upper outer quadrant with a Ki67 of 10%. She also has a history of osteoporosis and has progressive multiple sclerosis and uses a rolling walker to ambulate. She also has fibromyalgia. She reports a history of multiple concussions due to falls. Patient underwent a left lumpectomy and sentinel node biopsy (0/2 nodes) on 03/31/17.     Currently in Pain?  Yes    Pain Score  3     Pain Location  Breast    Pain Orientation  Left;Upper;Lateral                       [facility] Adult PT Treatment/Exercise - 05/10/17 0001      Manual Therapy   Manual Lymphatic Drainage (MLD)  In supine, performed the  following: short neck, superficial and deep abdomen, right axilla and anterior interaxillary anastomosis, left groin and axillo-inguinal anastomosis, and left breast, directing fluid toward pathways; in right sidelying, left lateral breast to posterior interaxillary anastomosis and left axillo-inguinal anastomosis                  PT Long Term Goals - 05/10/17 1452      PT LONG TERM GOAL #1   Title  Patient demonstrates she has returned to baseline related to shoulder ROM and function.    Status  Achieved      PT LONG TERM GOAL #2   Title  Patient will verbalize understanding of self care for left breast lymphedema including self manual lymph drainage and appropriate compression garment.    Status  Partially Met      PT LONG TERM GOAL #3   Title  Patient will  report >/= 25% improvement in left breast swelling and pain.    Baseline  On 05/10/17 reports 50-60% improvement--a big improvement in the last week.    Status  Achieved      PT LONG TERM GOAL #4   Title  Patient will score </= 40 on Quick DASH to allow for improved upper extremity function.    Status  On-going      Breast Clinic Goals - 03/10/17 1409      Patient will be able to verbalize understanding of pertinent lymphedema risk reduction practices relevant to her diagnosis specifically related to skin care.   Time  1    Period  Days    Status  Achieved      Patient will be able to return demonstrate and/or verbalize understanding of the post-op home exercise program related to regaining shoulder range of motion.   Time  1    Period  Days    Status  Achieved      Patient will be able to verbalize understanding of the importance of attending the postoperative After Breast Cancer Class for further lymphedema risk reduction education and therapeutic exercise.   Time  1    Period  Days    Status  Achieved           Plan - 05/10/17 1529    Clinical Impression Statement  Pt. notes significant improvement in left breast swelling since last session.  She has met the goal for decrease in discomfort/swelling. Pt. does not want to pursue a pump right now and also wants to hold off on compression bra prescription.    Rehab Potential  Good    Clinical Impairments Affecting Rehab Potential  Above stated comorbidities    PT Frequency  2x / week    PT Duration  4 weeks    PT Treatment/Interventions  ADLs/Self Care Home Management;DME Instruction;Therapeutic exercise;Patient/family education;Orthotic Fit/Training;Manual techniques;Manual lymph drainage;Scar mobilization;Passive range of motion;Taping    PT Next Visit Plan  Continue manual lymph drainage.    PT Home Exercise Plan  post op shoulder ROM HEP    Consulted and Agree with Plan of Care  Patient       Patient will benefit from  skilled therapeutic intervention in order to improve the following deficits and impairments:  Decreased knowledge of precautions, Impaired UE functional use, Decreased range of motion, Decreased strength, Postural dysfunction, Pain, Decreased knowledge of use of DME, Increased edema  Visit Diagnosis: Localized edema  Aftercare following surgery for neoplasm     Problem List Patient Active Problem List   Diagnosis Date  Noted  . Malignant neoplasm of upper-outer quadrant of left breast in female, estrogen receptor positive ([place]) 03/09/2017  . Urinary hesitancy 01/27/2017  . Mild cognitive disorder 06/09/2016  . Genetic testing 12/17/2015  . Family history of breast cancer   . Family history of ovarian cancer   . Family history of pancreatic cancer   . Insomnia 06/10/2015  . Urinary frequency 06/10/2015  . Frequent falls 12/26/2014  . Snoring 12/26/2014  . Depression 08/01/2014  . Other fatigue 08/01/2014  . Multiple sclerosis, primary progressive ([place]) 02/07/2014  . Spastic gait 02/07/2014  . 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 Numbness 02/07/2014  . Foot drop, left 02/07/2014    Tariana Moldovan 05/10/2017, 3:36 PM  [place], [place], [address] Phone: [phone]   Fax:  [phone]  Name: Taylor Hancock MRN: [id number] Date of Birth: 1960-01-23  Serafina Royals, PT 05/10/17 3:36 PM

## 2017-05-12 ENCOUNTER — Encounter: Payer: Self-pay | Admitting: Radiation Oncology

## 2017-05-12 ENCOUNTER — Other Ambulatory Visit: Payer: Self-pay | Admitting: Oncology

## 2017-05-12 ENCOUNTER — Ambulatory Visit
Admission: RE | Admit: 2017-05-12 | Discharge: 2017-05-12 | Disposition: A | Discharge status: Home / Self Care | Payer: 59 | Source: Ambulatory Visit | Attending: Radiation Oncology | Admitting: Radiation Oncology

## 2017-05-12 DIAGNOSIS — Z510 Encounter for antineoplastic radiation therapy: Secondary | ICD-10-CM | POA: Diagnosis not present

## 2017-05-12 DIAGNOSIS — C50412 Malignant neoplasm of upper-outer quadrant of left female breast: Secondary | ICD-10-CM | POA: Diagnosis not present

## 2017-05-12 DIAGNOSIS — Z170 Estrogen receptor positive status [ER+]: Secondary | ICD-10-CM | POA: Insufficient documentation

## 2017-05-12 NOTE — Progress Notes (Signed)
I was called by Dr. Isidore Moos to try to clarify the plan on Ms. Deblasi.  We have 2 notes on the same day 04/08/2017 1 of which says Taxol Herceptin and the other ones as antiestrogens only.  I believe from the later node in the day Dr. Sonny Dandy decided against anti-HER-2 and Taxol.  Note that he has written no orders for Taxol or Herceptin.  However the patient could receive anti-HER-2 treatment with antiestrogens and no Taxol and if she were to receive any anti-HER-2 treatment, which is optional given the size of her tumor, I would not give Taxol to this patient with given her comorbidities, instead she would receive Herceptin only with antiestrogens.  (I would be my recommendation even though we do not have data for that combination in this stage (we do know from stage IV that anti-HER-2 treatment and antiestrogens are synergistic).  Accordingly Dr. Isidore Moos will proceed with same at this point.

## 2017-05-12 NOTE — Progress Notes (Signed)
  Radiation Oncology         [phone] ________________________________  Name: Taylor Hancock MRN: [id number]  Date: 05/12/2017  DOB: December 27, 1960  SIMULATION AND TREATMENT PLANNING NOTE    Outpatient  DIAGNOSIS:     ICD-10-CM   1. Malignant neoplasm of upper-outer quadrant of left breast in female, estrogen receptor positive ([place]) C50.412    Z17.0     NARRATIVE:  The patient was brought to the [facility].  Identity was confirmed.  All relevant records and images related to the planned course of therapy were reviewed.  The patient freely provided informed written consent to proceed with treatment after reviewing the details related to the planned course of therapy. The consent form was witnessed and verified by the simulation staff.    Then, the patient was set-up in a stable reproducible supine position for radiation therapy with her ipsilateral arm over her head, and her upper body secured in a custom-made Vac-lok device.  CT images were obtained.  Surface markings were placed.  The CT images were loaded into the planning software.    TREATMENT PLANNING NOTE: Treatment planning then occurred.  The radiation prescription was entered and confirmed.     A total of 3 medically necessary complex treatment devices were fabricated and supervised by me: 2 fields with MLCs for custom blocks to protect heart, and lungs;  and, a Vac-lok. MORE COMPLEX DEVICES MAY BE MADE IN DOSIMETRY FOR FIELD IN FIELD BEAMS FOR DOSE HOMOGENEITY.  I have requested : 3D Simulation which is medically necessary to give adequate dose to at risk tissues while sparing lungs and heart.  I have requested a DVH of the following structures: lungs, heart, lumpectomy cavity.    The patient will receive 40.05 Gy in 15 fractions to the left breast with 2 tangential fields. This will be followed by a boost.  Optical Surface Tracking Plan:  Since intensity modulated radiotherapy (IMRT) and 3D conformal radiation  treatment methods are predicated on accurate and precise positioning for treatment, intrafraction motion monitoring is medically necessary to ensure accurate and safe treatment delivery. The ability to quantify intrafraction motion without excessive ionizing radiation dose can only be performed with optical surface tracking. Accordingly, surface imaging offers the opportunity to obtain 3D measurements of patient position throughout IMRT and 3D treatments without excessive radiation exposure. I am ordering optical surface tracking for this patient's upcoming course of radiotherapy.  ________________________________   Reference:  Ursula Alert, J, et al. Surface imaging-based analysis of intrafraction motion for breast radiotherapy patients.Journal of [place], n. 6, nov. 2014. ISSN [id number].  Available at: <http://www.jacmp.org/index.php/jacmp/article/view/4957>.    -----------------------------------  Eppie Gibson, MD

## 2017-05-13 ENCOUNTER — Telehealth: Payer: Self-pay | Admitting: Hematology and Oncology

## 2017-05-13 ENCOUNTER — Ambulatory Visit: Payer: 59 | Admitting: Physical Therapy

## 2017-05-13 DIAGNOSIS — Z483 Aftercare following surgery for neoplasm: Secondary | ICD-10-CM

## 2017-05-13 DIAGNOSIS — Z170 Estrogen receptor positive status [ER+]: Secondary | ICD-10-CM

## 2017-05-13 DIAGNOSIS — C50412 Malignant neoplasm of upper-outer quadrant of left female breast: Secondary | ICD-10-CM | POA: Diagnosis not present

## 2017-05-13 DIAGNOSIS — R600 Localized edema: Secondary | ICD-10-CM

## 2017-05-13 NOTE — Patient Instructions (Signed)
[facility] [address] [place], [place] [address]         [phone]  Why exercise?  So many benefits! Here are SOME of them: 1. Heart health, including raising your good cholesterol level and reducing heart rate and blood pressure 2. Lung health, including improved lung capacity 3. It burns fats, and most of us can stand to be leaner, whether or not we are overweight. 4. It increases the body's natural painkillers and mood elevators, so makes you feel better. 5. Not only makes you feel better, but look better too 6. Improves sleep 7. Takes a bite out of stress 8. May decrease your risk of many types of cancer 9. If you are currently undergoing cancer treatment, exercise may improve your ability to tolerate treatments including chemotherapy. 10. For everybody, it can improve your energy level. Those with cancer-related fatigue report a 40-50% reduction in this symptom when exercising regularly. 11. If you are a survivor of breast, colon, or prostate cancer, it may decrease your risk of a recurrence. (This may hold for other cancers too, but so far we have data just for these three types.)  How to exercise: 1. Get your doctor's okay. 2. Pick something you enjoy doing, like walking, Zumba, biking, swimming, or whatever. 3. Start at low intensity and time, then gradually increase.  (See walking program handout.) 4. Set a goal to achieve over time.  The American Cancer Society, American Heart Association, and U.S. Dept. of Health and Human Services recommend 150 minutes of moderate exercise, 75 minutes of vigorous exercise, or a combination of both per week. This should be done in episodes at least 10 minutes long, spread throughout the week.  Need help being motivated? 1. Pick something you enjoy doing, because you'll be more inclined to stick with that activity than something that feels like a chore. 2. Do it with a friend so that you are accountable to each  other. 3. Schedule it into your day. Place it on your calendar and keep that appointment just like you do any appointment that you make. 4. Join an exercise group that meets at a specific time.  That way, you have to show up on time, and that makes it harder to procrastinate about doing your workout.  It also keeps you accountable-people begin to expect you to be there. 5. Join a gym where you feel comfortable and not intimidated, at the right cost. 6. Sign up for something that you'll need to be in shape for on a specific date, like a 1K or a 5K to walk or run, a 20 or 30 mile bike ride, a mud run or something like that. If the date is looming, you know you'll need to train to be ready for it.  An added benefit is that many of these are fundraisers for good causes. 7. If you've already paid for a gym membership, group exercise class or event, you might as well work out, so you haven't wasted your money!  

## 2017-05-13 NOTE — Therapy (Signed)
[place], [place], [address] Phone: [phone]   Fax:  [phone]  Physical Therapy Treatment  Patient Details  Name: Taylor Hancock MRN: [id number] Date of Birth: 1960/07/15 Referring Provider: Dr. Stark Klein   Encounter Date: 05/13/2017  PT End of Session - 05/13/17 1521    Visit Number  6    Number of Visits  9    PT Start Time  0938 Pt. late today    PT Stop Time  1518    PT Time Calculation (min)  36 min    Activity Tolerance  Patient tolerated treatment well    Behavior During Therapy  [facility] for tasks assessed/performed       Past Medical History:  Diagnosis Date  . Anxiety   . Complication of anesthesia    patient was awake during colonoscopy as drugs were not effective-reports that propofol is an effective analgesic for her  . Concussion   . Depression   . Family history of breast cancer   . Family history of ovarian cancer   . Family history of pancreatic cancer   . Fibromyalgia   . Movement disorder   . MS (multiple sclerosis) ([place] [facility])    primary progressive MS  . Multiple sclerosis ([place])   . Neuropathy   . Osteoporosis   . Vision abnormalities     Past Surgical History:  Procedure Laterality Date  . BREAST LUMPECTOMY WITH RADIOACTIVE SEED AND SENTINEL LYMPH NODE BIOPSY Left 03/31/2017   Procedure: BREAST LUMPECTOMY WITH RADIOACTIVE SEED AND SENTINEL LYMPH NODE BIOPSY;  Surgeon: Stark Klein, MD;  Location: [facility];  Service: General;  Laterality: Left;  . FRACTURE SURGERY Left    hand  . I&D EXTREMITY  05/30/2011   Procedure: IRRIGATION AND DEBRIDEMENT EXTREMITY;  Surgeon: Tennis Must, MD;  Location: [facility];  Service: Orthopedics;;  Incision and drainage of open Proximal interphalangeal joint with closed reduction of Proximal interphalangeal  joint left long finger  . KNEE ARTHROSCOPY      There were no vitals filed for this visit.  Subjective Assessment - 05/13/17 1443     Subjective  "I'm marked an ready for next week. --Saw the radiation oncologist."    Pertinent History  Patient was diagnosed on 02/19/17 with left triple positive grade 2 invasive ductal carcinoma breast cancer. It measured 4 mm and was located in the upper outer quadrant with a Ki67 of 10%. She also has a history of osteoporosis and has progressive multiple sclerosis and uses a rolling walker to ambulate. She also has fibromyalgia. She reports a history of multiple concussions due to falls. Patient underwent a left lumpectomy and sentinel node biopsy (0/2 nodes) on 03/31/17.     Currently in Pain?  Yes    Pain Score  3     Pain Location  Breast    Pain Orientation  Left               Katina Dung - 05/13/17 0001    Open a tight or new jar  Moderate difficulty    Do heavy household chores (wash walls, wash floors)  Unable    Carry a shopping bag or briefcase  Unable    Wash your back  Moderate difficulty    Use a knife to cut food  Moderate difficulty    Recreational activities in which you take some force or impact through your arm, shoulder, or hand (golf, hammering, tennis)  Moderate difficulty  During the past week, to what extent has your arm, shoulder or hand problem interfered with your normal social activities with family, friends, neighbors, or groups?  Modererately    During the past week, to what extent has your arm, shoulder or hand problem limited your work or other regular daily activities  Modererately    Arm, shoulder, or hand pain.  Moderate    Tingling (pins and needles) in your arm, shoulder, or hand  Moderate    Difficulty Sleeping  Mild difficulty    DASH Score  56.82 %             [facility] Adult PT Treatment/Exercise - 05/13/17 0001      Manual Therapy   Manual Lymphatic Drainage (MLD)  In supine, performed the following: short neck, superficial and deep abdomen, right axilla and anterior interaxillary anastomosis, left groin and axillo-inguinal  anastomosis, and left breast, directing fluid toward pathways; in right sidelying, left lateral breast to posterior interaxillary anastomosis and left axillo-inguinal anastomosis             PT Education - 05/13/17 1520    Education provided  Yes    Education Details  about benefit of trying to stay active during radiation treatment    Person(s) Educated  Patient    Methods  Explanation;Handout CURE article on staying active, "Why exercise?" handout    Comprehension  Verbalized understanding          PT Long Term Goals - 05/13/17 1445      PT LONG TERM GOAL #1   Title  Patient demonstrates she has returned to baseline related to shoulder ROM and function.    Status  Achieved      PT LONG TERM GOAL #2   Title  Patient will verbalize understanding of self care for left breast lymphedema including self manual lymph drainage and appropriate compression garment.    Baseline  She is not able to do self-manual lymph drainage.    Status  Partially Met      PT LONG TERM GOAL #3   Title  Patient will report >/= 25% improvement in left breast swelling and pain.    Status  Achieved      PT LONG TERM GOAL #4   Title  Patient will score </= 40 on Quick DASH to allow for improved upper extremity function.    Baseline  63.64 on eval; 56 on 05/03/17, and patient says limitation is related to her MS    Status  Not Met      Breast Clinic Goals - 03/10/17 1409      Patient will be able to verbalize understanding of pertinent lymphedema risk reduction practices relevant to her diagnosis specifically related to skin care.   Time  1    Period  Days    Status  Achieved      Patient will be able to return demonstrate and/or verbalize understanding of the post-op home exercise program related to regaining shoulder range of motion.   Time  1    Period  Days    Status  Achieved      Patient will be able to verbalize understanding of the importance of attending the postoperative After Breast  Cancer Class for further lymphedema risk reduction education and therapeutic exercise.   Time  1    Period  Days    Status  Achieved           Plan - 05/13/17 1523  Clinical Impression Statement  Pt. is doing well currently and will be starting radiation shortly.  She chooses to be discharged at this time.  Goals have been partially met.    Rehab Potential  Good    Clinical Impairments Affecting Rehab Potential  Above stated comorbidities    PT Treatment/Interventions  ADLs/Self Care Home Management;DME Instruction;Therapeutic exercise;Patient/family education;Orthotic Fit/Training;Manual techniques;Manual lymph drainage;Scar mobilization;Passive range of motion;Taping    PT Next Visit Plan  Discharge    PT Home Exercise Plan  post op shoulder ROM HEP    Consulted and Agree with Plan of Care  Patient       Patient will benefit from skilled therapeutic intervention in order to improve the following deficits and impairments:  Decreased knowledge of precautions, Impaired UE functional use, Decreased range of motion, Decreased strength, Postural dysfunction, Pain, Decreased knowledge of use of DME, Increased edema  Visit Diagnosis: Localized edema  Aftercare following surgery for neoplasm  Malignant neoplasm of upper-outer quadrant of left breast in female, estrogen receptor positive ([facility])     Problem List Patient Active Problem List   Diagnosis Date Noted  . Malignant neoplasm of upper-outer quadrant of left breast in female, estrogen receptor positive ([facility]) 03/09/2017  . Urinary hesitancy 01/27/2017  . Mild cognitive disorder 06/09/2016  . Genetic testing 12/17/2015  . Family history of breast cancer   . Family history of ovarian cancer   . Family history of pancreatic cancer   . Insomnia 06/10/2015  . Urinary frequency 06/10/2015  . Frequent falls 12/26/2014  . Snoring 12/26/2014  . Depression 08/01/2014  . Other fatigue 08/01/2014  . Multiple sclerosis, primary  progressive ([facility]) 02/07/2014  . Spastic gait 02/07/2014  . Spastic diplegia ([facility] [facility]) 02/07/2014  . Numbness 02/07/2014  . Foot drop, left 02/07/2014    Yareth Macdonnell 05/13/2017, 3:24 PM  [place] [place], [place], [address] Phone: [phone]   Fax:  [phone]  Name: Taylor Hancock MRN: [id number] Date of Birth: 22-Jan-1960  PHYSICAL THERAPY DISCHARGE SUMMARY  Visits from Start of Care: 6  Current functional level related to goals / functional outcomes: Goals partially met as noted above.   Remaining deficits: No significant limitations other than those related to her MS diagnosis. She is not able to do self-manual lymph drainage due to numbness in her fingers from this.   Education / Equipment: Home exercise program. Plan: Patient agrees to discharge.  Patient goals were partially met. Patient is being discharged due to being pleased with the current functional level.  ?????    Serafina Royals, PT 05/13/17 3:26 PM

## 2017-05-13 NOTE — Telephone Encounter (Signed)
Scheduled appt per 4/24 sch message - left message with appt date and time .

## 2017-05-14 DIAGNOSIS — C50412 Malignant neoplasm of upper-outer quadrant of left female breast: Secondary | ICD-10-CM | POA: Diagnosis not present

## 2017-05-17 ENCOUNTER — Telehealth: Payer: Self-pay | Admitting: *Deleted

## 2017-05-17 NOTE — Telephone Encounter (Signed)
Call received from [name] in reference to "Tomorrow's radiation appointment with Dr. Lanell Persons.  I was tod tomorrow's appointment will be different.  they've already marked me.  What will be different?   Does my husband ned to take the day off to come?  Call transferred to Dr. Pearlie Oyster nurse for best help with this matter.

## 2017-05-19 ENCOUNTER — Ambulatory Visit
Admission: RE | Admit: 2017-05-19 | Discharge: 2017-05-19 | Disposition: A | Discharge status: Home / Self Care | Payer: 59 | Source: Ambulatory Visit | Attending: Radiation Oncology | Admitting: Radiation Oncology

## 2017-05-19 ENCOUNTER — Telehealth: Payer: Self-pay | Admitting: Hematology and Oncology

## 2017-05-19 DIAGNOSIS — Z923 Personal history of irradiation: Secondary | ICD-10-CM | POA: Diagnosis not present

## 2017-05-19 DIAGNOSIS — C50412 Malignant neoplasm of upper-outer quadrant of left female breast: Secondary | ICD-10-CM | POA: Diagnosis not present

## 2017-05-19 DIAGNOSIS — Z170 Estrogen receptor positive status [ER+]: Secondary | ICD-10-CM | POA: Diagnosis not present

## 2017-05-19 NOTE — Telephone Encounter (Signed)
Spoke to patient regarding upcoming may appointments per 4/30 sch message.

## 2017-05-20 ENCOUNTER — Inpatient Hospital Stay: Payer: 59 | Attending: Hematology and Oncology | Admitting: Hematology and Oncology

## 2017-05-20 ENCOUNTER — Ambulatory Visit
Admission: RE | Admit: 2017-05-20 | Discharge: 2017-05-20 | Disposition: A | Discharge status: Home / Self Care | Payer: 59 | Source: Ambulatory Visit | Attending: Radiation Oncology | Admitting: Radiation Oncology

## 2017-05-20 DIAGNOSIS — Z170 Estrogen receptor positive status [ER+]: Secondary | ICD-10-CM

## 2017-05-20 DIAGNOSIS — Z79899 Other long term (current) drug therapy: Secondary | ICD-10-CM | POA: Diagnosis not present

## 2017-05-20 DIAGNOSIS — Z79811 Long term (current) use of aromatase inhibitors: Secondary | ICD-10-CM | POA: Diagnosis not present

## 2017-05-20 DIAGNOSIS — G35 Multiple sclerosis: Secondary | ICD-10-CM | POA: Insufficient documentation

## 2017-05-20 DIAGNOSIS — C50412 Malignant neoplasm of upper-outer quadrant of left female breast: Secondary | ICD-10-CM | POA: Diagnosis not present

## 2017-05-20 NOTE — Progress Notes (Signed)
Patient Care Team: Shirline Frees, MD as PCP - General (Family Medicine)  DIAGNOSIS:  Encounter Diagnosis  Name Primary?  . Malignant neoplasm of upper-outer quadrant of left breast in female, estrogen receptor positive ([facility])     SUMMARY OF ONCOLOGIC HISTORY:   Malignant neoplasm of upper-outer quadrant of left breast in female, estrogen receptor positive ([facility])   03/04/2017 Initial Diagnosis    Screening detected 4 mm spiculated mass: left breast biopsy 2 o'clock position: IDC grade 2 With DCIS ER 100%, PR 20%, Ki-67 10%, HER-2 positive ratio 2.96, T1 a N0 stage I a      03/31/2017 Surgery    Left lumpectomy: IDC 0.5 cm, grade 3, DCIS high-grade, margins negative, 0/2 lymph nodes negative, ER 100%, PR 20%, Ki-67 10%, HER-2 positive ratio 2.96 T1 a N0 stage I a       CHIEF COMPLIANT: Follow-up to discuss adjuvant anti-HER-2 therapy plan.  INTERVAL HISTORY: Taylor Hancock is a 57 year old white lumpectomy that revealed a 0.5 cm HER-2 positive breast cancer.  She is seeing Dr. Isidore Moos for adjuvant radiation therapy.  There was a confusion regarding the adjuvant anti-HER-2 therapy plan and she is here today to clarify on these plans.  Previously we had discussed that because the tumor was fairly small, that we would skip the use of anti-HER-2 therapy for adjuvant treatment.  However patient is very keen on receiving anti-HER-2 therapy even though the potential benefits of fairly small.  She is here today to discuss the plan.  REVIEW OF SYSTEMS:   Constitutional: Denies fevers, chills or abnormal weight loss Eyes: Denies blurriness of vision Ears, nose, mouth, throat, and face: Denies mucositis or sore throat Respiratory: Denies cough, dyspnea or wheezes Cardiovascular: Denies palpitation, chest discomfort Gastrointestinal:  Denies nausea, heartburn or change in bowel habits Skin: Denies abnormal skin rashes Lymphatics: Denies new lymphadenopathy or easy bruising Neurological:Denies  numbness, tingling or new weaknesses Behavioral/Psych: Mood is stable, no new changes  Extremities: No lower extremity edema Breast: Prior left lumpectomy All other systems were reviewed with the patient and are negative.  I have reviewed the past medical history, past surgical history, social history and family history with the patient and they are unchanged from previous note.  ALLERGIES:  is allergic to levaquin [levofloxacin in d5w]; ppa-mma express [compleat]; zithromax [azithromycin]; latex; and ultram [tramadol hcl].  MEDICATIONS:  Current Outpatient Medications  Medication Sig Dispense Refill  . calcium citrate-vitamin D (CITRACAL+D) 315-200 MG-UNIT tablet Take 1 tablet by mouth 2 (two) times daily.    . cholecalciferol (VITAMIN D) 1000 units tablet Take 1,000 Units by mouth daily. Pt reports 2000 UNITS daily, somedays up to 8000 UNITS.    Marland Kitchen clonazePAM (KLONOPIN) 1 MG tablet TAKE 4 TABLETS BY MOUTH AT BEDTIME 360 tablet 1  . cyclobenzaprine (FLEXERIL) 5 MG tablet TAKE 1 TO 2 TABLETS AT BEDTIME 60 tablet 11  . Omega-3 Fatty Acids (FISH OIL) 1000 MG CAPS Take by mouth.    . oxyCODONE (OXY IR/ROXICODONE) 5 MG immediate release tablet Take 1-2 tablets (5-10 mg total) by mouth every 6 (six) hours as needed. (Patient not taking: Reported on 04/27/2017) 20 tablet 0  . tiZANidine (ZANAFLEX) 4 MG tablet TAKE 2 TABLETS (8 MG TOTAL) BY MOUTH AT BEDTIME. 180 tablet 3   No current facility-administered medications for this visit.     PHYSICAL EXAMINATION: ECOG PERFORMANCE STATUS: 1 - Symptomatic but completely ambulatory  Vitals:   05/20/17 1311  BP: (!) 141/88  Pulse: 79  Resp: 18  Temp: 97.8 F (36.6 C)  SpO2: 100%   Filed Weights   05/20/17 1311  Weight: 164 lb 6.4 oz (74.6 kg)    GENERAL:alert, no distress and comfortable SKIN: skin color, texture, turgor are normal, no rashes or significant lesions EYES: normal, Conjunctiva are pink and non-injected, sclera  clear OROPHARYNX:no exudate, no erythema and lips, buccal mucosa, and tongue normal  NECK: supple, thyroid normal size, non-tender, without nodularity LYMPH:  no palpable lymphadenopathy in the cervical, axillary or inguinal LUNGS: clear to auscultation and percussion with normal breathing effort HEART: regular rate & rhythm and no murmurs and no lower extremity edema ABDOMEN:abdomen soft, non-tender and normal bowel sounds MUSCULOSKELETAL:no cyanosis of digits and no clubbing  NEURO: alert & oriented x 3 with fluent speech, no focal motor/sensory deficits EXTREMITIES: No lower extremity edema  LABORATORY DATA:  I have reviewed the data as listed CMP Latest Ref Rng & Units 03/23/2017 03/10/2017 01/09/2015  Glucose 65 - 99 mg/dL 191(H) 102 98  BUN 6 - 20 mg/dL '11 7 9  ' Creatinine 0.44 - 1.00 mg/dL 0.79 0.77 0.63  Sodium 135 - 145 mmol/L 138 143 140  Potassium 3.5 - 5.1 mmol/L 4.7 4.2 4.2  Chloride 101 - 111 mmol/L 101 105 105  CO2 22 - 32 mmol/L '24 24 25  ' Calcium 8.9 - 10.3 mg/dL 9.6 9.6 8.6(L)  Total Protein 6.5 - 8.1 g/dL 7.3 7.5 7.4  Total Bilirubin 0.3 - 1.2 mg/dL 1.4(H) 0.6 0.4  Alkaline Phos 38 - 126 U/L 53 60 60  AST 15 - 41 U/L 58(H) 43(H) 98(H)  ALT 14 - 54 U/L 44 35 118(H)    Lab Results  Component Value Date   WBC 8.3 03/23/2017   HGB 15.9 (H) 03/23/2017   HCT 44.6 03/23/2017   MCV 103.5 (H) 03/23/2017   PLT 287 03/23/2017   NEUTROABS 3.3 03/10/2017    ASSESSMENT & PLAN:  Malignant neoplasm of upper-outer quadrant of left breast in female, estrogen receptor positive ([facility]) 03/31/2017:Left lumpectomy: IDC 0.5 cm, grade 3, DCIS high-grade, margins negative, 0/2 lymph nodes negative, ER 100%, PR 20%, Ki-67 10%, HER-2 positive ratio 2.96 T1 a N0 stage I a  Current treatment: Adjuvant radiation therapy Adjuvant anti-HER-2 therapy discussion: I discussed with the patient that the benefits of systemic anti-HER-2 therapy for tumors that are the small are very minimal.   Herceptin alone without chemotherapy has not been studied in the adjuvant setting and potentially has only minimal benefits.  I explained to her that Herceptin is given intravenously through a port every 3 weeks for a year.  I discussed the cardiac effects of Herceptin.  I provided her with literature on Herceptin. If she decides that she wants to receive the treatment then we can proceed accordingly. Patient does not appear to be very keen on it at this time.  But she will make a decision after going home and reading through our information.  If she wishes to receive Herceptin then we will make an appointment to get her started with port placement and echocardiogram.  Multiple sclerosis: Uses a wheelchair.  No orders of the defined types were placed in this encounter.  The patient has a good understanding of the overall plan. she agrees with it. she will call with any problems that may develop before the next visit here.   Harriette Ohara, MD 05/20/17

## 2017-05-20 NOTE — Assessment & Plan Note (Signed)
03/31/2017:Left lumpectomy: IDC 0.5 cm, grade 3, DCIS high-grade, margins negative, 0/2 lymph nodes negative, ER 100%, PR 20%, Ki-67 10%, HER-2 positive ratio 2.96 T1 a N0 stage I a  Current treatment: Adjuvant radiation therapy Adjuvant anti-HER-2 therapy discussion: I discussed with the patient that the benefits of systemic anti-HER-2 therapy for tumors that are the small are very minimal.  However, patient is interested in receiving adjuvant Herceptin.  I discussed with her that this is not considered standard of care and the likelihood of benefit tends to be fairly small.  She is willing and understands this and is willing to proceed.  I discussed the risks of anti-HER-2 therapy including the risk of reversible decrease in ejection fraction of the heart.  Patient will get an echocardiogram.  I will request a port placement and echocardiogram. She will start anti-HER-2 therapy after these are complete.

## 2017-05-21 ENCOUNTER — Ambulatory Visit
Admission: RE | Admit: 2017-05-21 | Discharge: 2017-05-21 | Disposition: A | Discharge status: Home / Self Care | Payer: 59 | Source: Ambulatory Visit | Attending: Radiation Oncology | Admitting: Radiation Oncology

## 2017-05-21 DIAGNOSIS — C50412 Malignant neoplasm of upper-outer quadrant of left female breast: Secondary | ICD-10-CM | POA: Diagnosis not present

## 2017-05-24 ENCOUNTER — Ambulatory Visit
Admission: RE | Admit: 2017-05-24 | Discharge: 2017-05-24 | Disposition: A | Discharge status: Home / Self Care | Payer: 59 | Source: Ambulatory Visit | Attending: Radiation Oncology | Admitting: Radiation Oncology

## 2017-05-24 DIAGNOSIS — C50412 Malignant neoplasm of upper-outer quadrant of left female breast: Secondary | ICD-10-CM | POA: Diagnosis not present

## 2017-05-24 DIAGNOSIS — Z170 Estrogen receptor positive status [ER+]: Principal | ICD-10-CM

## 2017-05-24 MED ORDER — RADIAPLEXRX EX GEL
Freq: Once | CUTANEOUS | Status: AC
  Administered 2017-05-24: 17:00:00 via TOPICAL

## 2017-05-24 NOTE — Progress Notes (Signed)
Pt here for patient teaching.  Pt given Radiation and You booklet, skin care instructions and Radiaplex gel.  Reviewed areas of pertinence such as fatigue, skin changes, breast tenderness and breast swelling . Pt able to give teach back of to pat skin, use unscented/gentle soap and drink plenty of water,apply Radiaplex bid, avoid applying anything to skin within 4 hours of treatment, avoid wearing an under wire bra and to use an electric razor if they must shave. Pt verbalizes understanding of information given and will contact nursing with any questions or concerns.     Http://rtanswers.org/treatmentinformation/whattoexpect/index      

## 2017-05-25 ENCOUNTER — Ambulatory Visit
Admission: RE | Admit: 2017-05-25 | Discharge: 2017-05-25 | Disposition: A | Discharge status: Home / Self Care | Payer: 59 | Source: Ambulatory Visit | Attending: Radiation Oncology | Admitting: Radiation Oncology

## 2017-05-25 DIAGNOSIS — C50412 Malignant neoplasm of upper-outer quadrant of left female breast: Secondary | ICD-10-CM | POA: Diagnosis not present

## 2017-05-26 ENCOUNTER — Encounter: Payer: Self-pay | Admitting: General Practice

## 2017-05-26 ENCOUNTER — Ambulatory Visit
Admission: RE | Admit: 2017-05-26 | Discharge: 2017-05-26 | Disposition: A | Discharge status: Home / Self Care | Payer: 59 | Source: Ambulatory Visit | Attending: Radiation Oncology | Admitting: Radiation Oncology

## 2017-05-26 DIAGNOSIS — C50412 Malignant neoplasm of upper-outer quadrant of left female breast: Secondary | ICD-10-CM | POA: Diagnosis not present

## 2017-05-26 NOTE — Progress Notes (Signed)
[place]  Request for help received from volunteer on patient behalf - patient uses [employer] and has mobility issues.  Currently driving herself to/from appointments as she has little community support, wondered about any additional help available.  Is signed up w [facility] - however must be able to either provide travel companion or get herself in/out of car without driver assistance.  Feels she may be too weak to do this.  CSW and patient discussed available options - non medical transport such as [facility] cannot assist patients into/out of car.  Medical transport is typically not covered by insurance and would be arranged by private pay.  Patient plans to drive herself to appointments but is concerned about increased fatigue.  CSW asked patient to keep up advised of her needs, and to continue to try to work w social support network to get to/from appointments.  Edwyna Shell, LCSW Clinical Social Worker Phone:  [phone] .

## 2017-05-27 ENCOUNTER — Ambulatory Visit
Admission: RE | Admit: 2017-05-27 | Discharge: 2017-05-27 | Disposition: A | Discharge status: Home / Self Care | Payer: 59 | Source: Ambulatory Visit | Attending: Radiation Oncology | Admitting: Radiation Oncology

## 2017-05-27 DIAGNOSIS — C50412 Malignant neoplasm of upper-outer quadrant of left female breast: Secondary | ICD-10-CM | POA: Diagnosis not present

## 2017-05-28 ENCOUNTER — Ambulatory Visit
Admission: RE | Admit: 2017-05-28 | Discharge: 2017-05-28 | Disposition: A | Discharge status: Home / Self Care | Payer: 59 | Source: Ambulatory Visit | Attending: Radiation Oncology | Admitting: Radiation Oncology

## 2017-05-28 DIAGNOSIS — C50412 Malignant neoplasm of upper-outer quadrant of left female breast: Secondary | ICD-10-CM | POA: Diagnosis not present

## 2017-05-31 ENCOUNTER — Ambulatory Visit
Admission: RE | Admit: 2017-05-31 | Discharge: 2017-05-31 | Disposition: A | Discharge status: Home / Self Care | Payer: 59 | Source: Ambulatory Visit | Attending: Radiation Oncology | Admitting: Radiation Oncology

## 2017-05-31 DIAGNOSIS — C50412 Malignant neoplasm of upper-outer quadrant of left female breast: Secondary | ICD-10-CM | POA: Diagnosis not present

## 2017-06-01 ENCOUNTER — Ambulatory Visit
Admission: RE | Admit: 2017-06-01 | Discharge: 2017-06-01 | Disposition: A | Discharge status: Home / Self Care | Payer: 59 | Source: Ambulatory Visit | Attending: Radiation Oncology | Admitting: Radiation Oncology

## 2017-06-01 DIAGNOSIS — L821 Other seborrheic keratosis: Secondary | ICD-10-CM | POA: Diagnosis not present

## 2017-06-01 DIAGNOSIS — C50412 Malignant neoplasm of upper-outer quadrant of left female breast: Secondary | ICD-10-CM | POA: Diagnosis not present

## 2017-06-01 DIAGNOSIS — I872 Venous insufficiency (chronic) (peripheral): Secondary | ICD-10-CM | POA: Diagnosis not present

## 2017-06-01 DIAGNOSIS — L218 Other seborrheic dermatitis: Secondary | ICD-10-CM | POA: Diagnosis not present

## 2017-06-02 ENCOUNTER — Ambulatory Visit
Admission: RE | Admit: 2017-06-02 | Discharge: 2017-06-02 | Disposition: A | Discharge status: Home / Self Care | Payer: 59 | Source: Ambulatory Visit | Attending: Radiation Oncology | Admitting: Radiation Oncology

## 2017-06-02 DIAGNOSIS — C50412 Malignant neoplasm of upper-outer quadrant of left female breast: Secondary | ICD-10-CM | POA: Diagnosis not present

## 2017-06-03 ENCOUNTER — Ambulatory Visit
Admission: RE | Admit: 2017-06-03 | Discharge: 2017-06-03 | Disposition: A | Discharge status: Home / Self Care | Payer: 59 | Source: Ambulatory Visit | Attending: Radiation Oncology | Admitting: Radiation Oncology

## 2017-06-03 ENCOUNTER — Other Ambulatory Visit: Payer: Self-pay | Admitting: Neurology

## 2017-06-03 DIAGNOSIS — C50412 Malignant neoplasm of upper-outer quadrant of left female breast: Secondary | ICD-10-CM | POA: Diagnosis not present

## 2017-06-04 ENCOUNTER — Ambulatory Visit
Admission: RE | Admit: 2017-06-04 | Discharge: 2017-06-04 | Disposition: A | Discharge status: Home / Self Care | Payer: 59 | Source: Ambulatory Visit | Attending: Radiation Oncology | Admitting: Radiation Oncology

## 2017-06-04 DIAGNOSIS — L218 Other seborrheic dermatitis: Secondary | ICD-10-CM | POA: Diagnosis not present

## 2017-06-04 DIAGNOSIS — C50412 Malignant neoplasm of upper-outer quadrant of left female breast: Secondary | ICD-10-CM | POA: Diagnosis not present

## 2017-06-04 DIAGNOSIS — L821 Other seborrheic keratosis: Secondary | ICD-10-CM | POA: Diagnosis not present

## 2017-06-04 DIAGNOSIS — I872 Venous insufficiency (chronic) (peripheral): Secondary | ICD-10-CM | POA: Diagnosis not present

## 2017-06-07 ENCOUNTER — Ambulatory Visit
Admission: RE | Admit: 2017-06-07 | Discharge: 2017-06-07 | Disposition: A | Discharge status: Home / Self Care | Payer: 59 | Source: Ambulatory Visit | Attending: Radiation Oncology | Admitting: Radiation Oncology

## 2017-06-07 ENCOUNTER — Ambulatory Visit: Payer: 59 | Admitting: Radiation Oncology

## 2017-06-07 DIAGNOSIS — C50412 Malignant neoplasm of upper-outer quadrant of left female breast: Secondary | ICD-10-CM | POA: Diagnosis not present

## 2017-06-08 ENCOUNTER — Ambulatory Visit
Admission: RE | Admit: 2017-06-08 | Discharge: 2017-06-08 | Disposition: A | Discharge status: Home / Self Care | Payer: 59 | Source: Ambulatory Visit | Attending: Radiation Oncology | Admitting: Radiation Oncology

## 2017-06-08 DIAGNOSIS — C50412 Malignant neoplasm of upper-outer quadrant of left female breast: Secondary | ICD-10-CM | POA: Diagnosis not present

## 2017-06-09 ENCOUNTER — Ambulatory Visit
Admission: RE | Admit: 2017-06-09 | Discharge: 2017-06-09 | Disposition: A | Discharge status: Home / Self Care | Payer: 59 | Source: Ambulatory Visit | Attending: Radiation Oncology | Admitting: Radiation Oncology

## 2017-06-09 DIAGNOSIS — C50412 Malignant neoplasm of upper-outer quadrant of left female breast: Secondary | ICD-10-CM | POA: Diagnosis not present

## 2017-06-10 ENCOUNTER — Ambulatory Visit
Admission: RE | Admit: 2017-06-10 | Discharge: 2017-06-10 | Disposition: A | Discharge status: Home / Self Care | Payer: 59 | Source: Ambulatory Visit | Attending: Radiation Oncology | Admitting: Radiation Oncology

## 2017-06-10 DIAGNOSIS — C50412 Malignant neoplasm of upper-outer quadrant of left female breast: Secondary | ICD-10-CM | POA: Diagnosis not present

## 2017-06-11 ENCOUNTER — Ambulatory Visit
Admission: RE | Admit: 2017-06-11 | Discharge: 2017-06-11 | Disposition: A | Discharge status: Home / Self Care | Payer: 59 | Source: Ambulatory Visit | Attending: Radiation Oncology | Admitting: Radiation Oncology

## 2017-06-11 DIAGNOSIS — Z79811 Long term (current) use of aromatase inhibitors: Secondary | ICD-10-CM | POA: Diagnosis not present

## 2017-06-11 DIAGNOSIS — C50412 Malignant neoplasm of upper-outer quadrant of left female breast: Secondary | ICD-10-CM | POA: Diagnosis not present

## 2017-06-11 DIAGNOSIS — Z170 Estrogen receptor positive status [ER+]: Secondary | ICD-10-CM | POA: Diagnosis not present

## 2017-06-12 ENCOUNTER — Other Ambulatory Visit: Payer: Self-pay | Admitting: Neurology

## 2017-06-15 ENCOUNTER — Inpatient Hospital Stay (HOSPITAL_BASED_OUTPATIENT_CLINIC_OR_DEPARTMENT_OTHER): Payer: 59 | Admitting: Hematology and Oncology

## 2017-06-15 ENCOUNTER — Ambulatory Visit
Admission: RE | Admit: 2017-06-15 | Discharge: 2017-06-15 | Disposition: A | Discharge status: Home / Self Care | Payer: 59 | Source: Ambulatory Visit | Attending: Radiation Oncology | Admitting: Radiation Oncology

## 2017-06-15 ENCOUNTER — Telehealth: Payer: Self-pay | Admitting: Hematology and Oncology

## 2017-06-15 DIAGNOSIS — C50412 Malignant neoplasm of upper-outer quadrant of left female breast: Secondary | ICD-10-CM

## 2017-06-15 DIAGNOSIS — Z79811 Long term (current) use of aromatase inhibitors: Secondary | ICD-10-CM

## 2017-06-15 DIAGNOSIS — Z170 Estrogen receptor positive status [ER+]: Secondary | ICD-10-CM

## 2017-06-15 MED ORDER — LETROZOLE 2.5 MG PO TABS: 2.5000 mg | ORAL_TABLET | Freq: Every day | ORAL | 3 refills | Status: DC

## 2017-06-15 NOTE — Assessment & Plan Note (Signed)
03/31/2017:Left lumpectomy: IDC 0.5 cm, grade 3, DCIS high-grade, margins negative, 0/2 lymph nodes negative, ER 100%, PR 20%, Ki-67 10%, HER-2 positive ratio 2.96 T1 a N0 stage I a  Adjuvant radiation therapy 05/20/2017-06/15/2017 Patient did not want to go on single agent Herceptin  Treatment plan:  1.  Antiestrogen therapy with letrozole 2.5 mg daily 2. Surveillance with breast exams and annual mammograms.  Return to clinic in 3 months for survivorship care plan visit

## 2017-06-15 NOTE — Telephone Encounter (Signed)
Gave avs and calendar ° °

## 2017-06-15 NOTE — Progress Notes (Signed)
Patient Care Team: Shirline Frees, MD as PCP - General (Family Medicine)  DIAGNOSIS:  Encounter Diagnosis  Name Primary?  . Malignant neoplasm of upper-outer quadrant of left breast in female, estrogen receptor positive ([place])     SUMMARY OF ONCOLOGIC HISTORY:   Malignant neoplasm of upper-outer quadrant of left breast in female, estrogen receptor positive ([place])   03/04/2017 Initial Diagnosis    Screening detected 4 mm spiculated mass: left breast biopsy 2 o'clock position: IDC grade 2 With DCIS ER 100%, PR 20%, Ki-67 10%, HER-2 positive ratio 2.96, T1 a N0 stage I a      03/31/2017 Surgery    Left lumpectomy: IDC 0.5 cm, grade 3, DCIS high-grade, margins negative, 0/2 lymph nodes negative, ER 100%, PR 20%, Ki-67 10%, HER-2 positive ratio 2.96 T1 a N0 stage I a      05/20/2017 - 06/15/2017 Radiation Therapy    Adjuvant radiation therapy       CHIEF COMPLIANT: Follow-up towards the completion of radiation  INTERVAL HISTORY: Taylor Hancock is a 57 year old with above-mentioned history of left breast cancer treated with left lumpectomy and radiation.  She has 2 more days of radiation left.  She is tolerated it fairly well.  She did not want to receive anti-HER-2 therapy.  She is interested in taking antiestrogen therapy.  She continues to struggle with multiple sclerosis and requires a wheelchair for ambulation.  She is concerned about the blood clot risk with tamoxifen.  She wishes to take antiestrogen therapy with letrozole.  REVIEW OF SYSTEMS:   Constitutional: Denies fevers, chills or abnormal weight loss Eyes: Denies blurriness of vision Ears, nose, mouth, throat, and face: Denies mucositis or sore throat Respiratory: Denies cough, dyspnea or wheezes Cardiovascular: Denies palpitation, chest discomfort Gastrointestinal:  Denies nausea, heartburn or change in bowel habits Skin: Denies abnormal skin rashes Lymphatics: Denies new lymphadenopathy or easy  bruising Neurological:Denies numbness, tingling or new weaknesses Behavioral/Psych: Mood is stable, no new changes  Extremities: No lower extremity edema Breast: Radiation dermatitis All other systems were reviewed with the patient and are negative.  I have reviewed the past medical history, past surgical history, social history and family history with the patient and they are unchanged from previous note.  ALLERGIES:  is allergic to levaquin [levofloxacin in d5w]; ppa-mma express [compleat]; zithromax [azithromycin]; latex; and ultram [tramadol hcl].  MEDICATIONS:  Current Outpatient Medications  Medication Sig Dispense Refill  . calcium citrate-vitamin D (CITRACAL+D) 315-200 MG-UNIT tablet Take 1 tablet by mouth 2 (two) times daily.    . cholecalciferol (VITAMIN D) 1000 units tablet Take 1,000 Units by mouth daily. Pt reports 2000 UNITS daily, somedays up to 8000 UNITS.    Marland Kitchen clonazePAM (KLONOPIN) 1 MG tablet TAKE 4 TABLETS BY MOUTH EVERY DAY AT BEDTIME 360 tablet 1  . cyclobenzaprine (FLEXERIL) 5 MG tablet TAKE 1 TO 2 TABLETS AT BEDTIME 60 tablet 0  . Omega-3 Fatty Acids (FISH OIL) 1000 MG CAPS Take by mouth.    . oxyCODONE (OXY IR/ROXICODONE) 5 MG immediate release tablet Take 1-2 tablets (5-10 mg total) by mouth every 6 (six) hours as needed. (Patient not taking: Reported on 04/27/2017) 20 tablet 0  . tiZANidine (ZANAFLEX) 4 MG tablet TAKE 2 TABLETS (8 MG TOTAL) BY MOUTH AT BEDTIME. 180 tablet 0   No current facility-administered medications for this visit.     PHYSICAL EXAMINATION: ECOG PERFORMANCE STATUS: 1 - Symptomatic but completely ambulatory  Vitals:   06/15/17 1431  BP: (!) 143/97  Pulse: 87  Resp: 17  Temp: 98 F (36.7 C)  SpO2: 100%   Filed Weights    GENERAL:alert, no distress and comfortable SKIN: skin color, texture, turgor are normal, no rashes or significant lesions EYES: normal, Conjunctiva are pink and non-injected, sclera clear OROPHARYNX:no exudate, no  erythema and lips, buccal mucosa, and tongue normal  NECK: supple, thyroid normal size, non-tender, without nodularity LYMPH:  no palpable lymphadenopathy in the cervical, axillary or inguinal LUNGS: clear to auscultation and percussion with normal breathing effort HEART: regular rate & rhythm and no murmurs and no lower extremity edema ABDOMEN:abdomen soft, non-tender and normal bowel sounds MUSCULOSKELETAL:no cyanosis of digits and no clubbing  NEURO: alert & oriented x 3 with fluent speech, no focal motor/sensory deficits EXTREMITIES: No lower extremity edema  LABORATORY DATA:  I have reviewed the data as listed CMP Latest Ref Rng & Units 03/23/2017 03/10/2017 01/09/2015  Glucose 65 - 99 mg/dL 191(H) 102 98  BUN 6 - 20 mg/dL '11 7 9  ' Creatinine 0.44 - 1.00 mg/dL 0.79 0.77 0.63  Sodium 135 - 145 mmol/L 138 143 140  Potassium 3.5 - 5.1 mmol/L 4.7 4.2 4.2  Chloride 101 - 111 mmol/L 101 105 105  CO2 22 - 32 mmol/L '24 24 25  ' Calcium 8.9 - 10.3 mg/dL 9.6 9.6 8.6(L)  Total Protein 6.5 - 8.1 g/dL 7.3 7.5 7.4  Total Bilirubin 0.3 - 1.2 mg/dL 1.4(H) 0.6 0.4  Alkaline Phos 38 - 126 U/L 53 60 60  AST 15 - 41 U/L 58(H) 43(H) 98(H)  ALT 14 - 54 U/L 44 35 118(H)    Lab Results  Component Value Date   WBC 8.3 03/23/2017   HGB 15.9 (H) 03/23/2017   HCT 44.6 03/23/2017   MCV 103.5 (H) 03/23/2017   PLT 287 03/23/2017   NEUTROABS 3.3 03/10/2017    ASSESSMENT & PLAN:  Malignant neoplasm of upper-outer quadrant of left breast in female, estrogen receptor positive ([facility]) 03/31/2017:Left lumpectomy: IDC 0.5 cm, grade 3, DCIS high-grade, margins negative, 0/2 lymph nodes negative, ER 100%, PR 20%, Ki-67 10%, HER-2 positive ratio 2.96 T1 a N0 stage I a  Adjuvant radiation therapy 05/20/2017-06/15/2017 Patient did not want to go on single agent Herceptin  Treatment plan:  1.  Antiestrogen therapy with letrozole 2.5 mg daily to be started in about 3 to 4 weeks from now 2. Surveillance with breast exams  and annual mammograms.  Return to clinic in 3 months for survivorship care plan visit  No orders of the defined types were placed in this encounter.  The patient has a good understanding of the overall plan. she agrees with it. she will call with any problems that may develop before the next visit here.   Harriette Ohara, MD 06/15/17

## 2017-06-16 ENCOUNTER — Ambulatory Visit
Admission: RE | Admit: 2017-06-16 | Discharge: 2017-06-16 | Disposition: A | Discharge status: Home / Self Care | Payer: 59 | Source: Ambulatory Visit | Attending: Radiation Oncology | Admitting: Radiation Oncology

## 2017-06-16 DIAGNOSIS — C50412 Malignant neoplasm of upper-outer quadrant of left female breast: Secondary | ICD-10-CM | POA: Diagnosis not present

## 2017-06-17 ENCOUNTER — Ambulatory Visit
Admission: RE | Admit: 2017-06-17 | Discharge: 2017-06-17 | Disposition: A | Discharge status: Home / Self Care | Payer: 59 | Source: Ambulatory Visit | Attending: Radiation Oncology | Admitting: Radiation Oncology

## 2017-06-17 ENCOUNTER — Encounter: Payer: Self-pay | Admitting: Radiation Oncology

## 2017-06-17 DIAGNOSIS — C50412 Malignant neoplasm of upper-outer quadrant of left female breast: Secondary | ICD-10-CM | POA: Diagnosis not present

## 2017-06-30 NOTE — Progress Notes (Signed)
  Radiation Oncology         [phone] ________________________________  Name: Taylor Hancock MRN: [id number]  Date: 06/17/2017  DOB: 1960/07/25  End of Treatment Note  Diagnosis:   57 y.o. female with StageIA (cT1aN0M0, pT1aN0, grade III)LeftBreast UOQ Invasive Ductal Carcinomawith DCIS, ER+/ PR+/ Her2+    Indication for treatment:  Curative       Radiation treatment dates:   05/20/2017 - 06/17/2017  Site/dose:    1. Left Breast / 40.05 Gy in 15 fractions 2. Left Breast Boost / 10 Gy in 5 fractions  Beams/energy:    1. 3D / 6X, 10X Photon 2. 3D / 10X, 6X Photon  Narrative: The patient tolerated radiation treatment relatively well.  She did experience soreness and itching and had bright erythema and dryness over her left breast. She is applying Radiaplex and 1% hydrocortisone cream as needed to her skin. She also reported increased fatigue and was encouraged to resume going to the gym and exercising as tolerated.  Plan: The patient has completed radiation treatment. The patient will return to radiation oncology clinic for routine followup in one month. I advised them to call or return sooner if they have any questions or concerns related to their recovery or treatment.  -----------------------------------  Eppie Gibson, MD  This document serves as a record of services personally performed by Eppie Gibson, MD. It was created on her behalf by Rae Lips, a trained medical scribe. The creation of this record is based on the scribe's personal observations and the provider's statements to them. This document has been checked and approved by the attending provider.

## 2017-07-03 ENCOUNTER — Other Ambulatory Visit: Payer: Self-pay | Admitting: Neurology

## 2017-07-14 ENCOUNTER — Encounter: Payer: Self-pay | Admitting: Radiation Oncology

## 2017-07-14 NOTE — Progress Notes (Signed)
Taylor Hancock presents for follow up of radiation completed 06/17/17 to her Left Breast. She reports pain to her Right Hip which started yesterday. She continues to have redness and irritation to her Left Breast. She is using radiaplex and aquaphor to her Left Breast. She will switch to a vitamin E containing lotion when the radiaplex is completed. She has several raised areas around her Left nipple. She reports aspirating "pus" from one of those areas about a week ago.  She saw Dr. Lindi Adie on 06/15/17 and began Letrozole 2.5 mg daily. She will see survivorship on 09/16/17.   BP 139/87   Pulse 77   Temp 97.6 F (36.4 C)   Resp 20   SpO2 98% Comment: room air   Wt Readings from Last 3 Encounters:  05/20/17 164 lb 6.4 oz (74.6 kg)  04/27/17 153 lb (69.4 kg)  04/08/17 167 lb 6.4 oz (75.9 kg)

## 2017-07-16 ENCOUNTER — Other Ambulatory Visit: Payer: Self-pay

## 2017-07-16 ENCOUNTER — Ambulatory Visit
Admission: RE | Admit: 2017-07-16 | Discharge: 2017-07-16 | Disposition: A | Discharge status: Home / Self Care | Payer: 59 | Source: Ambulatory Visit | Attending: Radiation Oncology | Admitting: Radiation Oncology

## 2017-07-16 ENCOUNTER — Encounter: Payer: Self-pay | Admitting: Radiation Oncology

## 2017-07-16 VITALS — BP 139/87 | HR 77 | Temp 97.6°F | Resp 20

## 2017-07-16 DIAGNOSIS — Z08 Encounter for follow-up examination after completed treatment for malignant neoplasm: Secondary | ICD-10-CM | POA: Insufficient documentation

## 2017-07-16 DIAGNOSIS — C50412 Malignant neoplasm of upper-outer quadrant of left female breast: Secondary | ICD-10-CM | POA: Diagnosis present

## 2017-07-16 DIAGNOSIS — Z79899 Other long term (current) drug therapy: Secondary | ICD-10-CM | POA: Diagnosis not present

## 2017-07-16 DIAGNOSIS — Z881 Allergy status to other antibiotic agents status: Secondary | ICD-10-CM | POA: Diagnosis not present

## 2017-07-16 DIAGNOSIS — M25521 Pain in right elbow: Secondary | ICD-10-CM | POA: Insufficient documentation

## 2017-07-16 DIAGNOSIS — R5383 Other fatigue: Secondary | ICD-10-CM | POA: Insufficient documentation

## 2017-07-16 DIAGNOSIS — Z853 Personal history of malignant neoplasm of breast: Secondary | ICD-10-CM | POA: Insufficient documentation

## 2017-07-16 DIAGNOSIS — M25522 Pain in left elbow: Secondary | ICD-10-CM | POA: Diagnosis not present

## 2017-07-16 DIAGNOSIS — Z170 Estrogen receptor positive status [ER+]: Secondary | ICD-10-CM

## 2017-07-16 DIAGNOSIS — Z923 Personal history of irradiation: Secondary | ICD-10-CM | POA: Insufficient documentation

## 2017-07-16 HISTORY — DX: Personal history of irradiation: Z92.3

## 2017-07-16 NOTE — Progress Notes (Signed)
Radiation Oncology         [phone]) [phone] ________________________________  Name: Taylor Hancock MRN: [id number]  Date: 07/16/2017  DOB: Aug 24, 1960  Follow-Up Visit Note  Outpatient  CC: Shirline Frees, MD  Nicholas Lose, MD  Diagnosis and Prior Radiotherapy:    ICD-10-CM   1. Malignant neoplasm of upper-outer quadrant of left breast in female, estrogen receptor positive ([place]) C50.412    Z17.0    57 y.o. female with StageIA (cT1aN0M0, pT1aN0, grade III)LeftBreast UOQ Invasive Ductal Carcinomawith DCIS, ER+/ PR+/ Her2+    05/20/17-06/17/17: 40.05 Gy to the left breast in 15 fractions followed by a 10 Gy boost to the left breast in 5 fractions  CHIEF COMPLAINT: Here for follow-up and surveillance of left breast cancer  Narrative:  The patient returns today for routine follow-up. She reports continued redness and irritation to the left breast. She is using Radiaplex and Aquaphor to the left breast. She will switch to Vitamin E containing lotion when the Radiaplex is completed. She notes several raised areas around the left nipple. She notes aspirating "pus" from one of those areas about a week ago. She also reports a new onset of right hip and bilateral elbow pain as of yesterday. She also notes continued fatigue.  The patient saw Dr. Lindi Adie on 05/20/17 and began Letrozole half dose daily. She will see survivorship on 09/16/17.                         ALLERGIES:  is allergic to levaquin [levofloxacin in d5w]; ppa-mma express [compleat]; zithromax [azithromycin]; latex; and ultram [tramadol hcl].  Meds: Current Outpatient Medications  Medication Sig Dispense Refill  . clonazePAM (KLONOPIN) 1 MG tablet TAKE 4 TABLETS BY MOUTH EVERY DAY AT BEDTIME 360 tablet 1  . cyclobenzaprine (FLEXERIL) 5 MG tablet TAKE 1 TO 2 TABLETS AT BEDTIME 60 tablet 0  . Omega-3 Fatty Acids (FISH OIL) 1000 MG CAPS Take by mouth.    Marland Kitchen tiZANidine (ZANAFLEX) 4 MG tablet TAKE 2 TABLETS (8 MG TOTAL) BY MOUTH AT  BEDTIME. 180 tablet 0  . calcium citrate-vitamin D (CITRACAL+D) 315-200 MG-UNIT tablet Take 1 tablet by mouth 2 (two) times daily.    . cholecalciferol (VITAMIN D) 1000 units tablet Take 1,000 Units by mouth daily. Pt reports 2000 UNITS daily, somedays up to 8000 UNITS.    Derrill Memo ON 07/22/2017] letrozole (FEMARA) 2.5 MG tablet Take 1 tablet (2.5 mg total) by mouth daily. (Patient not taking: Reported on 07/16/2017) 90 tablet 3  . oxyCODONE (OXY IR/ROXICODONE) 5 MG immediate release tablet Take 1-2 tablets (5-10 mg total) by mouth every 6 (six) hours as needed. (Patient not taking: Reported on 04/27/2017) 20 tablet 0   No current facility-administered medications for this encounter.     Physical Findings: The patient is in no acute distress. Patient is alert and oriented. She presents in a wheelchair.  temperature is 97.6 F (36.4 C). Her blood pressure is 139/87 and her pulse is 77. Her respiration is 20 and oxygen saturation is 98%.    Satisfactory skin healing in radiotherapy fields. Hyperpigmentation over the left breast with some erythema. The skin over the left breast is dry.    Lab Findings: Lab Results  Component Value Date   WBC 8.3 03/23/2017   HGB 15.9 (H) 03/23/2017   HCT 44.6 03/23/2017   MCV 103.5 (H) 03/23/2017   PLT 287 03/23/2017    Radiographic Findings: No results found.  Impression/Plan: Healing  well from radiotherapy to the breast tissue.  I advised the patient to follow-up with her PCP and neurologist in regards to her new joint pain in her elbows and right hip and generalized body.  Continue skin care with topical Vitamin E Oil and / or lotion for at least 2 more months for further healing.  I encouraged her to continue with yearly mammography as appropriate (for intact breast tissue) and followup with medical oncology. I will see her back on an as-needed basis. I have encouraged her to call if she has any issues or concerns in the future. I wished her the very  best.   I spent 10 minutes face to face with the patient and more than 50% of that time was spent in counseling and/or coordination of care. _____________________________________   Eppie Gibson, MD   This document serves as a record of services personally performed by Eppie Gibson, MD. It was created on her behalf by Bethann Humble, a trained medical scribe. The creation of this record is based on the scribe's personal observations and the provider's statements to them. This document has been checked and approved by the attending provider.

## 2017-07-23 DIAGNOSIS — R5383 Other fatigue: Secondary | ICD-10-CM | POA: Diagnosis not present

## 2017-07-23 DIAGNOSIS — L308 Other specified dermatitis: Secondary | ICD-10-CM | POA: Diagnosis not present

## 2017-07-27 ENCOUNTER — Ambulatory Visit: Payer: 59 | Admitting: Neurology

## 2017-07-29 ENCOUNTER — Telehealth: Payer: Self-pay

## 2017-07-29 NOTE — Telephone Encounter (Signed)
Returned pt's call regarding she was concerned about developing a blood clot with taking Letrozole and whether her joint pain will increase with taking Letrozole. She started prescription today - she took a half a tablet today. Per Dr Lindi Adie, I let pt know Letrozole was one of the lower risk for blood clots and for the joint pain, per Dr Lindi Adie- pt is to take a half tablet of Letrozole daily x 1 week and if doing well, she can start taking 1 tab daily as prescribed. Pt voiced understanding. Discussed s/s of blood clot to report.  Pt voiced understanding. I also let her know if has any further questions or concerns, to call our office back.  No other needs per pt at this time.

## 2017-08-06 DIAGNOSIS — L218 Other seborrheic dermatitis: Secondary | ICD-10-CM | POA: Diagnosis not present

## 2017-08-10 ENCOUNTER — Encounter: Payer: Self-pay | Admitting: Neurology

## 2017-08-10 ENCOUNTER — Other Ambulatory Visit: Payer: Self-pay

## 2017-08-10 ENCOUNTER — Ambulatory Visit (INDEPENDENT_AMBULATORY_CARE_PROVIDER_SITE_OTHER): Payer: 59 | Admitting: Neurology

## 2017-08-10 VITALS — BP 142/88 | HR 101 | Resp 18 | Ht 64.0 in | Wt 174.0 lb

## 2017-08-10 DIAGNOSIS — F09 Unspecified mental disorder due to known physiological condition: Secondary | ICD-10-CM | POA: Diagnosis not present

## 2017-08-10 DIAGNOSIS — G35 Multiple sclerosis: Secondary | ICD-10-CM | POA: Diagnosis not present

## 2017-08-10 DIAGNOSIS — G35B0 Primary progressive multiple sclerosis, unspecified: Secondary | ICD-10-CM

## 2017-08-10 DIAGNOSIS — R5383 Other fatigue: Secondary | ICD-10-CM

## 2017-08-10 DIAGNOSIS — C50412 Malignant neoplasm of upper-outer quadrant of left female breast: Secondary | ICD-10-CM | POA: Diagnosis not present

## 2017-08-10 DIAGNOSIS — M21372 Foot drop, left foot: Secondary | ICD-10-CM

## 2017-08-10 DIAGNOSIS — R261 Paralytic gait: Secondary | ICD-10-CM

## 2017-08-10 DIAGNOSIS — G801 Spastic diplegic cerebral palsy: Secondary | ICD-10-CM

## 2017-08-10 DIAGNOSIS — Z170 Estrogen receptor positive status [ER+]: Secondary | ICD-10-CM

## 2017-08-10 MED ORDER — ESCITALOPRAM OXALATE 5 MG PO TABS: 5.0000 mg | ORAL_TABLET | Freq: Every day | ORAL | 4 refills | Status: DC

## 2017-08-10 NOTE — Progress Notes (Signed)
[facility]  PATIENT: Taylor Hancock DOB: 07/19/1960  REFERRING CLINICIAN: Shirline Hancock   HISTORY FROM: Patient REASON FOR VISIT: PPMS   HISTORICAL  CHIEF COMPLAINT:  Chief Complaint  Patient presents with  . Multiple Sclerosis    Continues off of dmt.  Dx. with breast ca (left breast) since last ov.  Had a lumpectomy, then radiation in May, now on Femara.  Sts. she has joint aches, more fatigue with Femara.  Would like to discuss her need for a scooter/fim     [facility]  PATIENT: Taylor Hancock DOB: March 29, 1960  REFERRING CLINICIAN: Shirline Hancock   HISTORY FROM: Patient REASON FOR VISIT: PPMS   HISTORICAL  CHIEF COMPLAINT:  Chief Complaint  Patient presents with  . Multiple Sclerosis    Continues off of dmt.  Dx. with breast ca (left breast) since last ov.  Had a lumpectomy, then radiation in May, now on Femara.  Sts. she has joint aches, more fatigue with Femara.  Would like to discuss her need for a scooter/fim    HISTORY OF PRESENT ILLNESS:  Taylor Hancock is a 57 year old woman with MS diagnosed in 2005.     Update 08/10/2017: She has PPMS and has had progressively worsening gait due to left hemiplegia with leg > arm weakness.  Gait is poor and she has frequent falls.  Currently, she uses a walker but continues to have falls.    She is having more trouble with her ADL's   She falls frequently and fractured her toes last year with a fall.  Due  To her leg weakness and poor gait, she has trouble getting to the bathroom in a timely manner leading to incontinence (urinary and occasionally fecal).   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      She [place] has urinary urgency/frequency.   Vision is fine.   She reports fatigue on a daily basis.    She is noting some depression.   She has mild cognitive issues, unchanged this year.     To help her get from room to room and to do her household  chores, she needs a power scooter.   She cannot walk well with a cane.   She has used a walker the past couple year but is falling and can't move well enough.   She cannot self propel a standard or lightweight wheelchair due to left arm symptoms and rapid fatigue.    Therefore, she needs a powered vehicle.   A scooter should allow her to accomplish her ADL's and to get form room to room to use the bathroom and do her simple household chores.     She also has breast cancer and had RadRx and Femara.  It is ER+/PR+/Her+.  The LN's were negative.   She sees Dr. Lindi Hancock.   On the Femara or with radiation, she had a lot more myalgias.   She had not been on any MS DMT for many years.      Update 01/26/2017:   She has primary progressive MS.   As there are no great treatments for PPMS, she is off of any DMT.   Ocrelizumab has been discussed in the past but due to limited benefit and infectious risk she has decided not to start.    She is noting more weakness in her legs, left worse than right.  Has mild left arm weakness.  The right leg weakness has come  on much more over the past couple years.    Gait is spastic and off balanced and she has multiple falls.    She is no longer able to take any steps without falling.   Even with her walker, she is falling.   She is doing PT Advertising account executive center at [facility]) and feels they have helped her some but her gait continues to  progressively worsen.   They have helped her make more adaptions to her house, especially in the bathroom.   She is noting more difficulty with her hands and typing is slower with more mistakes.   Handwriting is worse and her hand spasms with writing.   Grip strength is reduced.    She has more difficulty raising her hands overhead.    She is on clonazepam, Zanaflex and Flexerill  Her fatigue continues and has worsened over the past year.  She also has had more cognitive issues with reduced executive function, focus and attention.    She often has  trouble with verbal fluency.   Multi-tasking is more difficult.     She has some depression, helped a little by Lexapro.  Constipation is a problem at times.   Has also had a few episodes of fecal incontinence.  Urinary hesitancy is worse.     She also has urinary frequency an occasional incontinence.     From 08/19/2016: MS:.  She has a primary progressive MS.  There has been steady progression of her spasticity, weakness and cognitive fog.     Gait/strength:    Hr gait is worse and the left leg seems weaker and more spastic.   She has a left foot drop but not right.    She uses a Rollator walker now almost exclusively.   The left leg is more spastic and spasticity I often painful.  Clonazepam at bedtime helps sleep and spasticity some and she now takes 4 mg at night.     Tizanidine also helped..  Baclofen and daytime muscle relaxants have been poorly tolerated.  Due to risk of side effects, she did not want to try Ampyra in the past. Her left foot drop does better with a Walk-Aide Neurostimulator on the left.      Sensory:  She gets right > left trunk dysesthesia.  This is stable.   Both of her feet feel numb but not painful  Vision:  She has not had optic neuritis.   She notes reduced depth perception compared to the past. She does not feel safe driving at night anymore.  Bowel/Bladder:    Bladder function is stable with urinary frequency and urgency.     Rarely she has urge incontinence and this has happened a few times at work.   .   She has rare UTI.   She notes bowel incontinence at times.    Fatigue/sleep;  She has fatigue, worse as day goes on and with heat.   The fatigue is both mental and physical.      She falls asleep easily but may wake up some at night.   She sometimes naps during the day (and if so, sleep is worse at night)  Mood:    She has some job and marital stress and has some depression.    She has some crying spells and can be irritable.   She reports she is not drinking  much.       She is not currently on an antidepressant.  She was once  on Lexapro and it helped a little bit.   We discussed restarting it (she has 10 mg pills at the house)  Cognitive:   She feels cognition is worse and she is more forgetful.    We discussed the MRI does show some atrophy from the MS which may be playing a large role.    Also fatigue is interacting with her cognitive issues.  .    She notes that she is often forgetful, worse in the afternoons.    She also has difficulties with verbal fluency. She has some issues with in executive function and has more difficulty with planning and doing complicated tasks.       We discussed work as it is becoming harder for her to focus and she has more errors (cognitive and typographical).  She sometimes forgets what she is doing and needs to start over.   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     She has physical, fatigue and cognitive issues that are all making employment difficulty.    She feels she makes errors all day but this is worse after a few hours when the fatigue kicks in.     MS History:  She had a [place] sign in the 1990's a few times and she felt her neck was weaker at that time.  However, gait was not affected at that time.  She was diagnosed with multiple sclerosis in 2005 after presenting with several years of worsening gait. Initially, she was diagnosed with relapsing remitting MS and was placed on Betaseron. She had difficulty tolerating Betaseron but did continue to take it. Her first MRI after the diagnosis was unchanged from her previous one. A couple years later she transferred her care to me. After review of her time course of disease, it was apparent that she had a progressive MS and not a relapsing form of MS. Therefore, the Betaseron was discontinued.    REVIEW OF SYSTEMS:  Constitutional: No fevers, chills, sweats, or change in appetite.  Notes fatigue.   Has insomnia and  sleepiness Eyes: No visual changes, double vision.  Notes light sensitivity and eye pain Ear, nose and throat: No hearing loss, ear pain, nasal congestion, sore throat.    Cardiovascular: No chest pain, palpitations Respiratory:  No shortness of breath at rest or with exertion.   No wheezes GastrointestinaI: No nausea, vomiting, diarrhea, abdominal pain.  Occ fecal incontinence Genitourinary:  see above.    Musculoskeletal:  She has myalgias Integumentary: No rash, pruritus, skin lesions.   Edema in feet Neurological: as above Psychiatric: Notes depression and anxiety  Endocrine: No palpitations, diaphoresis, change in appetite, change in weigh or increased thirst Hematologic/Lymphatic:  No anemia, purpura, petechiae. Allergic/Immunologic: No itchy/runny eyes, nasal congestion, recent allergic reactions, rashes  ALLERGIES: Allergies  Allergen Reactions  . Levaquin [Levofloxacin In D5w]   . Ppa-Mma Express [Compleat] Nausea And Vomiting    Blood in vomit and back felt like it was breaking.   Marland Kitchen Zithromax [Azithromycin] Other (See Comments)    abd cramping  . Latex Itching  . Ultram [Tramadol Hcl] Anxiety    HOME MEDICATIONS: Outpatient Medications Prior to Visit  Medication Sig Dispense Refill  . calcium citrate-vitamin D (CITRACAL+D) 315-200 MG-UNIT tablet Take 1 tablet by mouth 2 (two) times daily.    . cholecalciferol (VITAMIN D) 1000 units tablet Take 1,000 Units by mouth daily. Pt reports 2000 UNITS daily, somedays up to 8000 UNITS.    Marland Kitchen  clonazePAM (KLONOPIN) 1 MG tablet TAKE 4 TABLETS BY MOUTH EVERY DAY AT BEDTIME 360 tablet 1  . cyclobenzaprine (FLEXERIL) 5 MG tablet TAKE 1 TO 2 TABLETS AT BEDTIME 60 tablet 0  . letrozole (FEMARA) 2.5 MG tablet Take 1 tablet (2.5 mg total) by mouth daily. 90 tablet 3  . Omega-3 Fatty Acids (FISH OIL) 1000 MG CAPS Take by mouth.    . oxyCODONE (OXY IR/ROXICODONE) 5 MG immediate release tablet Take 1-2 tablets (5-10 mg total) by mouth every 6  (six) hours as needed. 20 tablet 0  . tiZANidine (ZANAFLEX) 4 MG tablet TAKE 2 TABLETS (8 MG TOTAL) BY MOUTH AT BEDTIME. 180 tablet 0   No facility-administered medications prior to visit.     PAST MEDICAL HISTORY: Past Medical History:  Diagnosis Date  . Anxiety   . Complication of anesthesia    patient was awake during colonoscopy as drugs were not effective-reports that propofol is an effective analgesic for her  . Concussion   . Depression   . Family history of breast cancer   . Family history of ovarian cancer   . Family history of pancreatic cancer   . Fibromyalgia   . History of radiation therapy 05/20/17- 06/17/17   Left Breast/ 40.05 Gy in 15 fractions, Left Breast Boost/ 10 Gy in 5 fractions.   . Movement disorder   . MS (multiple sclerosis) ([place])    primary progressive MS  . Multiple sclerosis ([place])   . Neuropathy   . Osteoporosis   . Vision abnormalities     PAST SURGICAL HISTORY: Past Surgical History:  Procedure Laterality Date  . BREAST LUMPECTOMY WITH RADIOACTIVE SEED AND SENTINEL LYMPH NODE BIOPSY Left 03/31/2017   Procedure: BREAST LUMPECTOMY WITH RADIOACTIVE SEED AND SENTINEL LYMPH NODE BIOPSY;  Surgeon: Stark Klein, MD;  Location: [facility];  Service: General;  Laterality: Left;  . FRACTURE SURGERY Left    hand  . I&D EXTREMITY  05/30/2011   Procedure: IRRIGATION AND DEBRIDEMENT EXTREMITY;  Surgeon: Tennis Must, MD;  Location: [facility];  Service: Orthopedics;;  Incision and drainage of open Proximal interphalangeal joint with closed reduction of Proximal interphalangeal  joint left long finger  . KNEE ARTHROSCOPY      FAMILY HISTORY: Family History  Problem Relation Age of Onset  . Hypertension Mother   . Breast cancer Mother 56  . Hypothyroidism Mother   . Hyperlipidemia Father   . Diabetes Father   . Pancreatic cancer Father 2  . Hypothyroidism Sister   . Ovarian cancer Maternal Aunt        dx in her 69s  . Parkinson's disease Maternal Uncle   .  Stroke Maternal Uncle   . Breast cancer Maternal Grandmother 100  . Breast cancer Maternal Aunt        dx under 74  . Breast cancer Maternal Aunt        dx over 84s  . Breast cancer Maternal Aunt        dx >50  . Breast cancer Other   . Hypothyroidism Brother   . Breast cancer Cousin   . Breast cancer Sister     SOCIAL HISTORY:  Social History   Socioeconomic History  . Marital status: Married    Spouse name: Not on file  . Number of children: Not on file  . Years of education: Not on file  . Highest education level: Not on file  Occupational History  . Not on file  Social Needs  .  Financial resource strain: Not on file  . Food insecurity:    Worry: Not on file    Inability: Not on file  . Transportation needs:    Medical: Not on file    Non-medical: Not on file  Tobacco Use  . Smoking status: Former Smoker    Packs/day: 0.50    Types: Cigarettes    Last attempt to quit: 11/25/2012    Years since quitting: 4.7  . Smokeless tobacco: Never Used  Substance and Sexual Activity  . Alcohol use: Yes    Comment: 12 alcohol drinks weekly  . Drug use: No  . Sexual activity: Not on file  Lifestyle  . Physical activity:    Days per week: Not on file    Minutes per session: Not on file  . Stress: Not on file  Relationships  . Social connections:    Talks on phone: Not on file    Gets together: Not on file    Attends religious service: Not on file    Active member of club or organization: Not on file    Attends meetings of clubs or organizations: Not on file    Relationship status: Not on file  . Intimate partner violence:    Fear of current or ex partner: Not on file    Emotionally abused: Not on file    Physically abused: Not on file    Forced sexual activity: Not on file  Other Topics Concern  . Not on file  Social History Narrative  . Not on file     PHYSICAL EXAM  Vitals:   08/10/17 1605  BP: (!) 142/88  Pulse: (!) 101  Resp: 18  Weight: 174 lb  (78.9 kg)  Height: 5\' 4"  (1.626 m)    Body mass index is 29.87 kg/m.   General: The patient is well-developed and well-nourished and in mild distress.   Neurologic Exam  Mental status: The patient is alert and oriented x 3 at the time of the examination. She has reduced concentration and had trouble coming up with words at times.    Cranial nerves: Extraocular movements are full.    Facial strength and sensation is normal. Trapezius strength is norma Palatal elevation. Tongue protrusion is midline. Hearing is symmetrically   Motor:  Muscle bulk is normal. She has moderate left and mild right increased tone. Strength is 2+to 3/5 in the left leg and hip flexor and 4/5 in the right upper leg and 4+/5 in the lower right .   Right arm is 5/5 but left arm and hand are 4/5.     Sensory: She has reduced vibration in her left leg.  Touch is more symmetric  Coordination: Cerebellar testing reveals reduced left left finger-nose-finger.   She has reduced heel-to-shin on the right and isunable to do on her left  Gait and station: Station is unstable and requires bilateral support.  She can take some steps with her walker and is spastic on the left with wide stance and reduced stride.     Reflexes: Deep tendon reflexes are symmetric and normal in the arms but she has increased reflexes in both legs. Left > right,  with nonsustained clonus at the ankles, worse on the left    DIAGNOSTIC DATA (LABS, IMAGING, TESTING) - I reviewed patient records, labs, notes, testing and imaging myself where available.     ASSESSMENT AND PLAN   Multiple sclerosis, primary progressive ([place])  Malignant neoplasm of upper-outer quadrant of left breast  in female, estrogen receptor positive ([facility])  Spastic diplegia ([facility])  Mild cognitive disorder  Spastic gait  Foot drop, left  Other fatigue   1.     Due to worsening gait and frequent falls,  she needs to have a power scooter (see details above) 2.    Continue medications for MS symptoms.     3.   She will return to see me in 4-5 months, sooner if she has new or worsening neurologic symptoms.      45 minute face to face evaluation with greater than 1/2 the time counseling or coordinating care about her MS and mobility issues.  Richard A. Felecia Shelling, MD, PhD 08/29/9145, 8:29 PM Certified in Neurology, Clinical Neurophysiology, Sleep Medicine, Pain Medicine and Neuroimaging  [facility] [address] [place], [place] [address] [phone]'

## 2017-08-11 ENCOUNTER — Telehealth: Payer: Self-pay

## 2017-08-11 NOTE — Telephone Encounter (Signed)
Returned patient's call regarding concerns with letrozole.  Patient stated that since increasing to full dose (2.5mg ) she is having severe joint pain, just feeling down and severe fatigue.  Patient is not able to exercise d/t having primary progressive MS.  Patient takes medication in the morning, with her other medications.  Per Dr. Lindi Adie recommendations: Patient to go back to Letrozole 1/2 tablet daily at night.  Patient voiced agreement.  Patient will call to f/u in 2 weeks to see if symptoms have subsided.  Patient knows to call with any concerns or questions prior to, if needed.

## 2017-08-17 ENCOUNTER — Telehealth: Payer: Self-pay | Admitting: Neurology

## 2017-08-17 NOTE — Telephone Encounter (Signed)
Patient calling to get an order for a power scooter as discussed in her last visit with Dr. Felecia Shelling.

## 2017-08-17 NOTE — Telephone Encounter (Signed)
I called to confirm exactly what she is needing and she says that she just needs an order for an electric scooter (not a power wheelchair), that includes the diagnosis codes. She would like this mailed to her home address. I confirmed address. Dr. Felecia Shelling, could you please place and print an order to be mailed to patient. Thank you.

## 2017-08-18 ENCOUNTER — Other Ambulatory Visit: Payer: Self-pay | Admitting: Neurology

## 2017-08-18 NOTE — Telephone Encounter (Signed)
I have mailed written order and last visit note to patient.

## 2017-08-18 NOTE — Telephone Encounter (Signed)
I wrote out a script and her last visit note was printed

## 2017-08-25 ENCOUNTER — Telehealth: Payer: Self-pay

## 2017-08-25 NOTE — Telephone Encounter (Signed)
Returned patient's call regarding follow up with Letrozole 2.5mg .  Patient reports that she has been taking 1/2 the tablet as recommended by Dr. Lindi Adie, symptoms have subsided at a level of toleration for patient.  Patient will start back taking the full dose and see how she tolerates.  Nurse informed patient to follow up with [place] if symptoms return for further recommendations.  Per recommendations may have to continue with half dose if symptoms return.  No further needs at this time.

## 2017-08-26 NOTE — Telephone Encounter (Signed)
Spoke with Sharyn Lull and explained that things mailed from our office take longer to arrive, I think b/c they go thru the [facility] mail system before going into the regular mail. She verbalized understanding of same and will call back if not received in the next wk/fim

## 2017-08-26 NOTE — Telephone Encounter (Signed)
Pt called stating she still hasn't received her order for a powered scooter. Pt requesting a call back to make sure nothing got lost in the mail.

## 2017-08-31 ENCOUNTER — Other Ambulatory Visit: Payer: Self-pay | Admitting: Neurology

## 2017-09-02 ENCOUNTER — Ambulatory Visit: Payer: 59 | Admitting: Neurology

## 2017-09-08 ENCOUNTER — Telehealth: Payer: Self-pay

## 2017-09-08 NOTE — Telephone Encounter (Signed)
Spoke with pt reminding of SCP visit with NP on 8/29 at 2 pm.  She has c/o of Letrozole side effects: nausea, acid reflux, aching joints.  Pt voiced that that some of it may be coming from her MS. She says she is not doing well at all. This nurse encouraged her to make sure she keeps appt.

## 2017-09-15 NOTE — Progress Notes (Deleted)
CLINIC:  Survivorship   REASON FOR VISIT:  Routine follow-up post-treatment for a recent history of breast cancer.  BRIEF ONCOLOGIC HISTORY:    Malignant neoplasm of upper-outer quadrant of left breast in female, estrogen receptor positive ([place])   03/04/2017 Initial Diagnosis    Screening detected 4 mm spiculated mass: left breast biopsy 2 o'clock position: IDC grade 2 With DCIS ER 100%, PR 20%, Ki-67 10%, HER-2 positive ratio 2.96, T1 a N0 stage I a    03/31/2017 Surgery    Left lumpectomy: IDC 0.5 cm, grade 3, DCIS high-grade, margins negative, 0/2 lymph nodes negative, ER 100%, PR 20%, Ki-67 10%, HER-2 positive ratio 2.96 T1 a N0 stage I a    05/20/2017 - 06/17/2017 Radiation Therapy    Adjuvant radiation therapy: 1. Left Breast / 40.05 Gy in 15 fractions.  2. Left Breast Boost / 10 Gy in 5 fractions    09/15/2017 Cancer Staging    Staging form: Breast, AJCC 8th Edition - Pathologic: Stage IA (pT1a, pN0, cM0, G3, ER+, PR+, HER2-) - Signed by Gardenia Phlegm, NP on 09/15/2017     INTERVAL HISTORY:  Taylor Hancock presents to the [facility] today for our initial meeting to review her survivorship care plan detailing her treatment course for breast cancer, as well as monitoring long-term side effects of that treatment, education regarding health maintenance, screening, and overall wellness and health promotion.     Overall, Taylor Hancock reports feeling quite well since completing her radiation therapy approximately 3 months ago.  She ***    REVIEW OF SYSTEMS:  Review of Systems - Oncology Breast: Denies any new nodularity, masses, tenderness, nipple changes, or nipple discharge.      ONCOLOGY TREATMENT TEAM:  1. Surgeon:  Dr. Marland Kitchen at [facility] 2. Medical Oncologist: Dr. Marland Kitchen  3. Radiation Oncologist: Dr. Marland Kitchen    PAST MEDICAL/SURGICAL HISTORY:  Past Medical History:  Diagnosis Date  . Anxiety   . Complication of anesthesia    patient was awake during  colonoscopy as drugs were not effective-reports that propofol is an effective analgesic for her  . Concussion   . Depression   . Family history of breast cancer   . Family history of ovarian cancer   . Family history of pancreatic cancer   . Fibromyalgia   . History of radiation therapy 05/20/17- 06/17/17   Left Breast/ 40.05 Gy in 15 fractions, Left Breast Boost/ 10 Gy in 5 fractions.   . Movement disorder   . MS (multiple sclerosis) ([place])    primary progressive MS  . Multiple sclerosis ([place])   . Neuropathy   . Osteoporosis   . Vision abnormalities    Past Surgical History:  Procedure Laterality Date  . BREAST LUMPECTOMY WITH RADIOACTIVE SEED AND SENTINEL LYMPH NODE BIOPSY Left 03/31/2017   Procedure: BREAST LUMPECTOMY WITH RADIOACTIVE SEED AND SENTINEL LYMPH NODE BIOPSY;  Surgeon: Stark Klein, MD;  Location: [facility];  Service: General;  Laterality: Left;  . FRACTURE SURGERY Left    hand  . I&D EXTREMITY  05/30/2011   Procedure: IRRIGATION AND DEBRIDEMENT EXTREMITY;  Surgeon: Tennis Must, MD;  Location: [facility];  Service: Orthopedics;;  Incision and drainage of open Proximal interphalangeal joint with closed reduction of Proximal interphalangeal  joint left long finger  . KNEE ARTHROSCOPY       ALLERGIES:  Allergies  Allergen Reactions  . Levaquin [Levofloxacin In D5w]   . Ppa-Mma Express [Compleat] Nausea And Vomiting  Blood in vomit and back felt like it was breaking.   Marland Kitchen Zithromax [Azithromycin] Other (See Comments)    abd cramping  . Latex Itching  . Ultram [Tramadol Hcl] Anxiety     CURRENT MEDICATIONS:  Outpatient Encounter Medications as of 09/16/2017  Medication Sig  . calcium citrate-vitamin D (CITRACAL+D) 315-200 MG-UNIT tablet Take 1 tablet by mouth 2 (two) times daily.  . cholecalciferol (VITAMIN D) 1000 units tablet Take 1,000 Units by mouth daily. Pt reports 2000 UNITS daily, somedays up to 8000 UNITS.  Marland Kitchen clonazePAM (KLONOPIN) 1 MG tablet TAKE 4 TABLETS BY  MOUTH EVERY DAY AT BEDTIME  . cyclobenzaprine (FLEXERIL) 5 MG tablet TAKE 1 TO 2 TABLETS AT BEDTIME  . escitalopram (LEXAPRO) 5 MG tablet Take 1 tablet (5 mg total) by mouth daily.  Marland Kitchen letrozole (FEMARA) 2.5 MG tablet Take 1 tablet (2.5 mg total) by mouth daily.  . Omega-3 Fatty Acids (FISH OIL) 1000 MG CAPS Take by mouth.  . oxyCODONE (OXY IR/ROXICODONE) 5 MG immediate release tablet Take 1-2 tablets (5-10 mg total) by mouth every 6 (six) hours as needed.  Marland Kitchen tiZANidine (ZANAFLEX) 4 MG tablet TAKE 2 TABLETS (8 MG TOTAL) BY MOUTH AT BEDTIME.   No facility-administered encounter medications on file as of 09/16/2017.      ONCOLOGIC FAMILY HISTORY:  Family History  Problem Relation Age of Onset  . Hypertension Mother   . Breast cancer Mother 59  . Hypothyroidism Mother   . Hyperlipidemia Father   . Diabetes Father   . Pancreatic cancer Father 51  . Hypothyroidism Sister   . Ovarian cancer Maternal Aunt        dx in her 2s  . Parkinson's disease Maternal Uncle   . Stroke Maternal Uncle   . Breast cancer Maternal Grandmother 100  . Breast cancer Maternal Aunt        dx under 6  . Breast cancer Maternal Aunt        dx over 42s  . Breast cancer Maternal Aunt        dx >50  . Breast cancer Other   . Hypothyroidism Brother   . Breast cancer Cousin   . Breast cancer Sister      GENETIC COUNSELING/TESTING: ***  SOCIAL HISTORY:  Taylor Hancock is /single/married/divorced/widowed/separated and lives alone/with her spouse/family/friend in (city), [place].  She has (#) children and they live in (city).  Taylor Hancock is currently retired/disabled/working part-time/full-time as ***.  She denies any current or history of tobacco, alcohol, or illicit drug use.     PHYSICAL EXAMINATION:  Vital Signs:  There were no vitals filed for this visit. There were no vitals filed for this visit. General: Well-nourished, well-appearing female in no acute distress.  She is  unaccompanied/accompanied in clinic by her ***** today.   HEENT: Head is normocephalic.  Pupils equal and reactive to light. Conjunctivae clear without exudate.  Sclerae anicteric. Oral mucosa is pink, moist.  Oropharynx is pink without lesions or erythema.  Lymph: No cervical, supraclavicular, or infraclavicular lymphadenopathy noted on palpation.  Cardiovascular: Regular rate and rhythm.Marland Kitchen Respiratory: Clear to auscultation bilaterally. Chest expansion symmetric; breathing non-labored.  GI: Abdomen soft and round; non-tender, non-distended. Bowel sounds normoactive.  GU: Deferred.  Neuro: No focal deficits. Steady gait.  Psych: Mood and affect normal and appropriate for situation.  Extremities: No edema. MSK: No focal spinal tenderness to palpation.  Full range of motion in bilateral upper extremities Skin: Warm and dry.  LABORATORY  DATA:  None for this visit.  DIAGNOSTIC IMAGING:  None for this visit.      ASSESSMENT AND PLAN:  Ms.. Hancock is a pleasant 57 y.o. female with Stage IA /left breast invasive ductal carcinoma, ER+/PR+/HER2-, diagnosed in 02/2017, treated with lumpectomy, adjuvant radiation therapy, and anti-estrogen therapy with Letrozole beginning in 06/2017.  She presents to the Survivorship Clinic for our initial meeting and routine follow-up post-completion of treatment for breast cancer.    1. Stage IA left breast cancer:  Taylor Hancock is continuing to recover from definitive treatment for breast cancer. She will follow-up with her medical oncologist, Dr. Ross Ludwig in (month) /2017 with history and physical exam per surveillance protocol.  She will continue her anti-estrogen therapy with (drug). Thus far, she is tolerating the *** well, with minimal side effects. She was instructed to make Dr. Lindi Adie or myself aware if she begins to experience any worsening side effects of the medication and I could see her back in clinic to help manage those side effects, as needed.  Though the incidence is low, there is an associated risk of endometrial cancer with anti-estrogen therapies like Tamoxifen.  Taylor Hancock was encouraged to contact Dr. Carrington Clamp or myself with any vaginal bleeding while taking Tamoxifen. Other side effects of Tamoxifen were again reviewed with her as well. Today, a comprehensive survivorship care plan and treatment summary was reviewed with the patient today detailing her breast cancer diagnosis, treatment course, potential late/long-term effects of treatment, appropriate follow-up care with recommendations for the future, and patient education resources.  A copy of this summary, along with a letter will be sent to the patient's primary care provider via mail/fax/In Basket message after today's visit.    #. Problem(s) at Visit______________  #. Bone health:  Given Taylor Hancock age/history of breast cancer and her current treatment regimen including anti-estrogen therapy with _______, she is at risk for bone demineralization.  Her last DEXA scan was **/**/20**, which showed (results).***  In the meantime, she was encouraged to increase her consumption of foods rich in calcium, as well as increase her weight-bearing activities.  She was given education on specific activities to promote bone health.  #. Cancer screening:  Due to Taylor Hancock history and her age, she should receive screening for skin cancers, colon cancer, and gynecologic cancers.  The information and recommendations are listed on the patient's comprehensive care plan/treatment summary and were reviewed in detail with the patient.    #. Health maintenance and wellness promotion: Taylor Hancock was encouraged to consume 5-7 servings of fruits and vegetables per day. We reviewed the "Nutrition Rainbow" handout, as well as the handout "Take Control of Your Health and Reduce Your Cancer Risk" from the [facility].  She was also encouraged to engage in moderate to vigorous exercise for 30 minutes  per day most days of the week. We discussed the [employer] fitness program, which is designed for cancer survivors to help them become more physically fit after cancer treatments.  She was instructed to limit her alcohol consumption and continue to abstain from tobacco use/***was encouraged stop smoking.     #. Support services/counseling: It is not uncommon for this period of the patient's cancer care trajectory to be one of many emotions and stressors.  We discussed an opportunity for her to participate in the next session of [facility] ("Finding Your New Normal") support group series designed for patients after they have completed treatment.   Taylor Hancock was encouraged to take  advantage of our many other support services programs, support groups, and/or counseling in coping with her new life as a cancer survivor after completing anti-cancer treatment.  She was offered support today through active listening and expressive supportive counseling.  She was given information regarding our available services and encouraged to contact me with any questions or for help enrolling in any of our support group/programs.    Dispo:   -Return to cancer center ***  -Mammogram due in *** -Follow up with surgery *** -She is welcome to return back to the Survivorship Clinic at any time; no additional follow-up needed at this time.  -Consider referral back to survivorship as a long-term survivor for continued surveillance  A total of (30) minutes of face-to-face time was spent with this patient with greater than 50% of that time in counseling and care-coordination.   Gardenia Phlegm, [place] [phone]   Note: PRIMARY CARE PROVIDER Shirline Frees, [place] [phone]

## 2017-09-16 ENCOUNTER — Telehealth: Payer: Self-pay | Admitting: *Deleted

## 2017-09-16 ENCOUNTER — Telehealth: Payer: Self-pay

## 2017-09-16 ENCOUNTER — Inpatient Hospital Stay: Payer: 59 | Admitting: Adult Health

## 2017-09-16 ENCOUNTER — Telehealth: Payer: Self-pay | Admitting: Adult Health

## 2017-09-16 NOTE — Telephone Encounter (Signed)
Returned pt's call to r./s appt

## 2017-09-16 NOTE — Telephone Encounter (Signed)
Pt called lmovm " I cant come into, I'm hurting and I'm npt going to make it today." Returned call to pt advised a message will be sent to scheduling. Pt requesting afternoon apt. Pt requesting to s/w desk RN to discuss additional concerns. Message to scheduling. Call transferred to Dr. Lindi Adie RN per pt request.

## 2017-09-16 NOTE — Telephone Encounter (Signed)
Called pt per triage call. Pt states that she took herself off the letrozole due to immobility and constant pain that she has been experiencing over the past few weeks. Pt started letrozole in July and had cut down the dose in half. She states that every single joint is hurting and also worsening of indigestion. She is off of letrozole x2 weeks now. Pt has hx of osteoporosis and MS.    She cancelled her appt today due to her pain and stress. She just found out that her husband has cancer and will be going through chemo in [place]. Pt very tearful at this time. Provided pt with emotional support and told pt that we will cancel her appt with Mendel Ryder today. Will discuss with Mendel Ryder if she would like pt to take anything for her heartburn symptoms and if she will need to be rescheduled with Dr.Gudena to discuss anti estrogen treatment, moving forward.   Pt verbalized understanding and thankful for the call.

## 2017-09-21 ENCOUNTER — Inpatient Hospital Stay: Payer: 59 | Admitting: Adult Health

## 2017-09-21 ENCOUNTER — Telehealth: Payer: Self-pay | Admitting: Adult Health

## 2017-09-21 ENCOUNTER — Telehealth: Payer: Self-pay

## 2017-09-21 NOTE — Telephone Encounter (Signed)
Called patientt regarding 9/13

## 2017-09-21 NOTE — Telephone Encounter (Signed)
Returned call to patient in regards to rescheduling SCP visit with [place].  Patient expressed that she is having issues with taking Letrozole and needed to come to discuss with NP.  She is not feeling well while taking.  However, She is unable to reschedule at this time d/t flare up with primary progressive  MS.  She stated that she fell 3 times this past Sunday and is unable to stand, shower, etc on her own.  Patient is also dealing with her husband who has cancer.  Told patient will discuss with NP and get back with her.

## 2017-09-22 NOTE — Telephone Encounter (Signed)
LVM on Taylor Hancock's phone to stop Letrozole until next appt (10/01/17) and to call neurologist about recent bout with MS symptoms.  Number left for patient to call back is she has questions.

## 2017-09-22 NOTE — Telephone Encounter (Signed)
Tell patient to hold Letrozole until we can see her again.  Needs to call neuro about these symptoms ASAP.

## 2017-10-01 ENCOUNTER — Encounter: Payer: 59 | Admitting: Adult Health

## 2017-10-01 NOTE — Progress Notes (Deleted)
CLINIC:  Survivorship   REASON FOR VISIT:  Routine follow-up post-treatment for a recent history of breast cancer.  BRIEF ONCOLOGIC HISTORY:    Malignant neoplasm of upper-outer quadrant of left breast in female, estrogen receptor positive ([place])   12/18/2015 Genetic Testing    Seen by genetic counselors for testing due to [place] of pancreatic, ovarian, and breast cancer.  Genetic testing was negative and revealed no deleterious mutation.  Genes tested include: APC, ATM, BARD1, BRCA1, BRCA2, BRIP1, CDH1, CDK4, CDKN2A, CHEK2, EPCAM, FANCC, MLH1, MSH2, MSH6, NBN, PALB2, PMS2, PTEN, RAD51C, RAD51D, STK11, TP53, VHL, and XRCC2.    03/04/2017 Initial Diagnosis    Screening detected 4 mm spiculated mass: left breast biopsy 2 o'clock position: IDC grade 2 With DCIS ER 100%, PR 20%, Ki-67 10%, HER-2 positive ratio 2.96, T1 a N0 stage I a    03/31/2017 Surgery    Left lumpectomy: IDC 0.5 cm, grade 3, DCIS high-grade, margins negative, 0/2 lymph nodes negative, ER 100%, PR 20%, Ki-67 10%, HER-2 positive ratio 2.96 T1 a N0 stage I a    05/20/2017 - 06/17/2017 Radiation Therapy    Adjuvant radiation therapy: 1. Left Breast / 40.05 Gy in 15 fractions.  2. Left Breast Boost / 10 Gy in 5 fractions    06/2017 -  Anti-estrogen oral therapy    Letrozole daily    09/15/2017 Cancer Staging    Staging form: Breast, AJCC 8th Edition - Pathologic: Stage IA (pT1a, pN0, cM0, G3, ER+, PR+, HER2-) - Signed by Gardenia Phlegm, NP on 09/15/2017     INTERVAL HISTORY:  Taylor Hancock presents to the [facility] today for our initial meeting to review her survivorship care plan detailing her treatment course for breast cancer, as well as monitoring long-term side effects of that treatment, education regarding health maintenance, screening, and overall wellness and health promotion.     Overall, Taylor Hancock reports feeling quite well since completing her radiation therapy approximately 3 months ago.  She  ***    REVIEW OF SYSTEMS:  Review of Systems - Oncology Breast: Denies any new nodularity, masses, tenderness, nipple changes, or nipple discharge.      ONCOLOGY TREATMENT TEAM:  1. Surgeon:  Dr. Marland Kitchen at [facility] 2. Medical Oncologist: Dr. Marland Kitchen  3. Radiation Oncologist: Dr. Marland Kitchen    PAST MEDICAL/SURGICAL HISTORY:  Past Medical History:  Diagnosis Date  . Anxiety   . Complication of anesthesia    patient was awake during colonoscopy as drugs were not effective-reports that propofol is an effective analgesic for her  . Concussion   . Depression   . Family history of breast cancer   . Family history of ovarian cancer   . Family history of pancreatic cancer   . Fibromyalgia   . History of radiation therapy 05/20/17- 06/17/17   Left Breast/ 40.05 Gy in 15 fractions, Left Breast Boost/ 10 Gy in 5 fractions.   . Movement disorder   . MS (multiple sclerosis) ([place])    primary progressive MS  . Multiple sclerosis ([place])   . Neuropathy   . Osteoporosis   . Vision abnormalities    Past Surgical History:  Procedure Laterality Date  . BREAST LUMPECTOMY WITH RADIOACTIVE SEED AND SENTINEL LYMPH NODE BIOPSY Left 03/31/2017   Procedure: BREAST LUMPECTOMY WITH RADIOACTIVE SEED AND SENTINEL LYMPH NODE BIOPSY;  Surgeon: Stark Klein, MD;  Location: [facility];  Service: General;  Laterality: Left;  . FRACTURE SURGERY Left    hand  .  I&D EXTREMITY  05/30/2011   Procedure: IRRIGATION AND DEBRIDEMENT EXTREMITY;  Surgeon: Tennis Must, MD;  Location: [facility];  Service: Orthopedics;;  Incision and drainage of open Proximal interphalangeal joint with closed reduction of Proximal interphalangeal  joint left long finger  . KNEE ARTHROSCOPY       ALLERGIES:  Allergies  Allergen Reactions  . Levaquin [Levofloxacin In D5w]   . Ppa-Mma Express [Compleat] Nausea And Vomiting    Blood in vomit and back felt like it was breaking.   Marland Kitchen Zithromax [Azithromycin] Other (See Comments)    abd  cramping  . Latex Itching  . Ultram [Tramadol Hcl] Anxiety     CURRENT MEDICATIONS:  Outpatient Encounter Medications as of 10/01/2017  Medication Sig  . calcium citrate-vitamin D (CITRACAL+D) 315-200 MG-UNIT tablet Take 1 tablet by mouth 2 (two) times daily.  . cholecalciferol (VITAMIN D) 1000 units tablet Take 1,000 Units by mouth daily. Pt reports 2000 UNITS daily, somedays up to 8000 UNITS.  Marland Kitchen clonazePAM (KLONOPIN) 1 MG tablet TAKE 4 TABLETS BY MOUTH EVERY DAY AT BEDTIME  . cyclobenzaprine (FLEXERIL) 5 MG tablet TAKE 1 TO 2 TABLETS AT BEDTIME  . escitalopram (LEXAPRO) 5 MG tablet Take 1 tablet (5 mg total) by mouth daily.  Marland Kitchen letrozole (FEMARA) 2.5 MG tablet Take 1 tablet (2.5 mg total) by mouth daily.  . Omega-3 Fatty Acids (FISH OIL) 1000 MG CAPS Take by mouth.  . oxyCODONE (OXY IR/ROXICODONE) 5 MG immediate release tablet Take 1-2 tablets (5-10 mg total) by mouth every 6 (six) hours as needed.  Marland Kitchen tiZANidine (ZANAFLEX) 4 MG tablet TAKE 2 TABLETS (8 MG TOTAL) BY MOUTH AT BEDTIME.   No facility-administered encounter medications on file as of 10/01/2017.      ONCOLOGIC FAMILY HISTORY:  Family History  Problem Relation Age of Onset  . Hypertension Mother   . Breast cancer Mother 39  . Hypothyroidism Mother   . Hyperlipidemia Father   . Diabetes Father   . Pancreatic cancer Father 57  . Hypothyroidism Sister   . Ovarian cancer Maternal Aunt        dx in her 63s  . Parkinson's disease Maternal Uncle   . Stroke Maternal Uncle   . Breast cancer Maternal Grandmother 100  . Breast cancer Maternal Aunt        dx under 12  . Breast cancer Maternal Aunt        dx over 63s  . Breast cancer Maternal Aunt        dx >50  . Breast cancer Other   . Hypothyroidism Brother   . Breast cancer Cousin   . Breast cancer Sister      GENETIC COUNSELING/TESTING: ***  SOCIAL HISTORY:  [name] is /single/married/divorced/widowed/separated and lives alone/with her  spouse/family/friend in (city), [place].  She has (#) children and they live in (city).  Ms. Willig is currently retired/disabled/working part-time/full-time as ***.  She denies any current or history of tobacco, alcohol, or illicit drug use.     PHYSICAL EXAMINATION:  Vital Signs:  There were no vitals filed for this visit. There were no vitals filed for this visit. General: Well-nourished, well-appearing female in no acute distress.  She is unaccompanied/accompanied in clinic by her ***** today.   HEENT: Head is normocephalic.  Pupils equal and reactive to light. Conjunctivae clear without exudate.  Sclerae anicteric. Oral mucosa is pink, moist.  Oropharynx is pink without lesions or erythema.  Lymph: No cervical, supraclavicular, or  infraclavicular lymphadenopathy noted on palpation.  Cardiovascular: Regular rate and rhythm.Marland Kitchen Respiratory: Clear to auscultation bilaterally. Chest expansion symmetric; breathing non-labored.  GI: Abdomen soft and round; non-tender, non-distended. Bowel sounds normoactive.  GU: Deferred.  Neuro: No focal deficits. Steady gait.  Psych: Mood and affect normal and appropriate for situation.  Extremities: No edema. MSK: No focal spinal tenderness to palpation.  Full range of motion in bilateral upper extremities Skin: Warm and dry.  LABORATORY DATA:  None for this visit.  DIAGNOSTIC IMAGING:  None for this visit.      ASSESSMENT AND PLAN:  Ms.. Aguallo is a pleasant 57 y.o. female with Stage IA left breast invasive ductal carcinoma, ER+/PR+/HER2+, diagnosed in 02/2017, treated with lumpectomy, adjuvant radiation therapy, and anti-estrogen therapy with Letrozole beginning in 06/2017 (declined Her-2 therapy).  She presents to the Survivorship Clinic for our initial meeting and routine follow-up post-completion of treatment for breast cancer.    1. Stage IA left breast cancer:  Ms. Lynn is continuing to recover from definitive treatment for breast cancer. She  will follow-up with her medical oncologist, Dr. Lindi Adie in 6 months with history and physical exam per surveillance protocol.  She will continue her anti-estrogen therapy with (drug). Thus far, she is tolerating the *** well, with minimal side effects. She was instructed to make Dr. Lindi Adie or myself aware if she begins to experience any worsening side effects of the medication and I could see her back in clinic to help manage those side effects, as needed. Though the incidence is low, there is an associated risk of endometrial cancer with anti-estrogen therapies like Tamoxifen.  Ms. Riccobono was encouraged to contact Dr. Carrington Clamp or myself with any vaginal bleeding while taking Tamoxifen. Other side effects of Tamoxifen were again reviewed with her as well. Today, a comprehensive survivorship care plan and treatment summary was reviewed with the patient today detailing her breast cancer diagnosis, treatment course, potential late/long-term effects of treatment, appropriate follow-up care with recommendations for the future, and patient education resources.  A copy of this summary, along with a letter will be sent to the patient's primary care provider via mail/fax/In Basket message after today's visit.    #. Problem(s) at Visit______________  #. Bone health:  Given Ms. Serratore age/history of breast cancer and her current treatment regimen including anti-estrogen therapy with Letrozole, she is at risk for bone demineralization.  Her last DEXA scan was in 05/2016 that shows a h/o osteoporosis with a  T score of -2.7 in the left femur.  In the meantime, she was encouraged to increase her consumption of foods rich in calcium, as well as increase her weight-bearing activities.  She was given education on specific activities to promote bone health.  #. Cancer screening:  Due to Ms. Shiffer history and her age, she should receive screening for skin cancers, colon cancer, and gynecologic cancers.  The information and  recommendations are listed on the patient's comprehensive care plan/treatment summary and were reviewed in detail with the patient.    #. Health maintenance and wellness promotion: Ms. Dehaan was encouraged to consume 5-7 servings of fruits and vegetables per day. We reviewed the "Nutrition Rainbow" handout, as well as the handout "Take Control of Your Health and Reduce Your Cancer Risk" from the [facility].  She was also encouraged to engage in moderate to vigorous exercise for 30 minutes per day most days of the week. We discussed the [employer] fitness program, which is designed for cancer survivors  to help them become more physically fit after cancer treatments.  She was instructed to limit her alcohol consumption and continue to abstain from tobacco use/***was encouraged stop smoking.     #. Support services/counseling: It is not uncommon for this period of the patient's cancer care trajectory to be one of many emotions and stressors.  We discussed an opportunity for her to participate in the next session of [facility] ("Finding Your New Normal") support group series designed for patients after they have completed treatment.   Ms. Renovato was encouraged to take advantage of our many other support services programs, support groups, and/or counseling in coping with her new life as a cancer survivor after completing anti-cancer treatment.  She was offered support today through active listening and expressive supportive counseling.  She was given information regarding our available services and encouraged to contact me with any questions or for help enrolling in any of our support group/programs.    Dispo:   -Return to cancer center ***  -Mammogram due in *** -Bone density due 05/2018 -Follow up with surgery *** -She is welcome to return back to the Survivorship Clinic at any time; no additional follow-up needed at this time.  -Consider referral back to survivorship as a long-term survivor for  continued surveillance  A total of (30) minutes of face-to-face time was spent with this patient with greater than 50% of that time in counseling and care-coordination.   Gardenia Phlegm, JAARS [phone]   Note: PRIMARY CARE PROVIDER Shirline Frees, [place] [phone]

## 2017-10-05 DIAGNOSIS — R399 Unspecified symptoms and signs involving the genitourinary system: Secondary | ICD-10-CM | POA: Diagnosis not present

## 2017-10-05 DIAGNOSIS — R609 Edema, unspecified: Secondary | ICD-10-CM | POA: Diagnosis not present

## 2017-10-05 DIAGNOSIS — R030 Elevated blood-pressure reading, without diagnosis of hypertension: Secondary | ICD-10-CM | POA: Diagnosis not present

## 2017-10-15 ENCOUNTER — Other Ambulatory Visit: Payer: Self-pay | Admitting: Neurology

## 2017-10-21 DIAGNOSIS — I10 Essential (primary) hypertension: Secondary | ICD-10-CM | POA: Diagnosis not present

## 2017-10-21 DIAGNOSIS — G35 Multiple sclerosis: Secondary | ICD-10-CM | POA: Diagnosis not present

## 2017-10-22 ENCOUNTER — Telehealth: Payer: Self-pay | Admitting: Adult Health

## 2017-10-22 ENCOUNTER — Encounter: Payer: Self-pay | Admitting: Adult Health

## 2017-10-22 ENCOUNTER — Inpatient Hospital Stay: Payer: 59 | Attending: Adult Health | Admitting: Adult Health

## 2017-10-22 VITALS — BP 121/92 | HR 98 | Temp 98.4°F | Resp 18 | Ht 64.0 in

## 2017-10-22 DIAGNOSIS — C50412 Malignant neoplasm of upper-outer quadrant of left female breast: Secondary | ICD-10-CM | POA: Diagnosis present

## 2017-10-22 DIAGNOSIS — Z87891 Personal history of nicotine dependence: Secondary | ICD-10-CM | POA: Diagnosis not present

## 2017-10-22 DIAGNOSIS — M810 Age-related osteoporosis without current pathological fracture: Secondary | ICD-10-CM | POA: Insufficient documentation

## 2017-10-22 DIAGNOSIS — Z800 Family history of malignant neoplasm of digestive organs: Secondary | ICD-10-CM | POA: Diagnosis not present

## 2017-10-22 DIAGNOSIS — R109 Unspecified abdominal pain: Secondary | ICD-10-CM | POA: Insufficient documentation

## 2017-10-22 DIAGNOSIS — Z803 Family history of malignant neoplasm of breast: Secondary | ICD-10-CM | POA: Diagnosis not present

## 2017-10-22 DIAGNOSIS — Z510 Encounter for antineoplastic radiation therapy: Secondary | ICD-10-CM

## 2017-10-22 DIAGNOSIS — J811 Chronic pulmonary edema: Secondary | ICD-10-CM | POA: Diagnosis not present

## 2017-10-22 DIAGNOSIS — Z8041 Family history of malignant neoplasm of ovary: Secondary | ICD-10-CM

## 2017-10-22 DIAGNOSIS — Z79811 Long term (current) use of aromatase inhibitors: Secondary | ICD-10-CM | POA: Diagnosis not present

## 2017-10-22 DIAGNOSIS — K5900 Constipation, unspecified: Secondary | ICD-10-CM

## 2017-10-22 DIAGNOSIS — Z79899 Other long term (current) drug therapy: Secondary | ICD-10-CM | POA: Insufficient documentation

## 2017-10-22 DIAGNOSIS — Z170 Estrogen receptor positive status [ER+]: Secondary | ICD-10-CM

## 2017-10-22 DIAGNOSIS — R197 Diarrhea, unspecified: Secondary | ICD-10-CM

## 2017-10-22 DIAGNOSIS — I890 Lymphedema, not elsewhere classified: Secondary | ICD-10-CM | POA: Diagnosis not present

## 2017-10-22 NOTE — Telephone Encounter (Signed)
Gave pt avs and calendar  °

## 2017-10-22 NOTE — Progress Notes (Signed)
CLINIC:  Survivorship   REASON FOR VISIT:  Routine follow-up post-treatment for a recent history of breast cancer.  BRIEF ONCOLOGIC HISTORY:    Malignant neoplasm of upper-outer quadrant of left breast in female, estrogen receptor positive ([place])   12/18/2015 Genetic Testing    Seen by genetic counselors for testing due to [place] of pancreatic, ovarian, and breast cancer.  Genetic testing was negative and revealed no deleterious mutation.  Genes tested include: APC, ATM, BARD1, BRCA1, BRCA2, BRIP1, CDH1, CDK4, CDKN2A, CHEK2, EPCAM, FANCC, MLH1, MSH2, MSH6, NBN, PALB2, PMS2, PTEN, RAD51C, RAD51D, STK11, TP53, VHL, and XRCC2.    03/04/2017 Initial Diagnosis    Screening detected 4 mm spiculated mass: left breast biopsy 2 o'clock position: IDC grade 2 With DCIS ER 100%, PR 20%, Ki-67 10%, HER-2 positive ratio 2.96, T1 a N0 stage I a    03/31/2017 Surgery    Left lumpectomy: IDC 0.5 cm, grade 3, DCIS high-grade, margins negative, 0/2 lymph nodes negative, ER 100%, PR 20%, Ki-67 10%, HER-2 positive ratio 2.96 T1 a N0 stage I a    05/20/2017 - 06/17/2017 Radiation Therapy    Adjuvant radiation therapy: 1. Left Breast / 40.05 Gy in 15 fractions.  2. Left Breast Boost / 10 Gy in 5 fractions    06/2017 -  Anti-estrogen oral therapy    Letrozole daily    09/15/2017 Cancer Staging    Staging form: Breast, AJCC 8th Edition - Pathologic: Stage IA (pT1a, pN0, cM0, G3, ER+, PR+, HER2-) - Signed by Gardenia Phlegm, NP on 09/15/2017     INTERVAL HISTORY:  Ms. Taylor Hancock presents to the [facility] today for our initial meeting to review her survivorship care plan detailing her treatment course for breast cancer, as well as monitoring long-term side effects of that treatment, education regarding health maintenance, screening, and overall wellness and health promotion.     Overall, Taylor Hancock reports feeling moderately well.  She has primary progressive MS and has had a tough time with this recently.   She started on Letrozole at half dose in July 2019, at first she tolerated it well, however when she went up to the full dose a few weeks later she had more difficulty.  She was tearful, depressed, and developed increased indigestion.  She called up here and she went back down to a half dose, however her symptoms remained. She stopped completely in August and is just now back to normal.    Taylor Hancock tells me that she has lymphedema in her breast.  She wonders if she doesn't have lymphedema in her legs.  She also wonders why she didn't have a oncotype test on her breast cancer.   She is doing well otherwise.     REVIEW OF SYSTEMS:  Review of Systems  Constitutional: Positive for fatigue (related to MS). Negative for appetite change, chills, fever and unexpected weight change.  HENT:   Negative for hearing loss, lump/mass and sore throat.   Eyes: Negative for eye problems and icterus.  Respiratory: Negative for chest tightness and cough.   Cardiovascular: Negative for chest pain, leg swelling and palpitations.  Gastrointestinal: Positive for constipation and diarrhea (chronic back and forth between diarrhea and constipation). Negative for abdominal distention, abdominal pain, nausea and vomiting.  Endocrine: Negative for hot flashes.  Skin: Negative for itching and rash.  Neurological: Positive for extremity weakness (related to MS). Negative for dizziness, headaches and numbness.  Hematological: Negative for adenopathy. Does not bruise/bleed easily.  Psychiatric/Behavioral: Negative  for depression. The patient is not nervous/anxious.    Breast: Denies any new nodularity, masses, tenderness, nipple changes, or nipple discharge.      ONCOLOGY TREATMENT TEAM:  1. Surgeon:  Dr. Barry Dienes at [facility] 2. Medical Oncologist: Dr. Lindi Adie  3. Radiation Oncologist: Dr. Isidore Moos    PAST MEDICAL/SURGICAL HISTORY:  Past Medical History:  Diagnosis Date  . Anxiety   . Complication of  anesthesia    patient was awake during colonoscopy as drugs were not effective-reports that propofol is an effective analgesic for her  . Concussion   . Depression   . Family history of breast cancer   . Family history of ovarian cancer   . Family history of pancreatic cancer   . Fibromyalgia   . History of radiation therapy 05/20/17- 06/17/17   Left Breast/ 40.05 Gy in 15 fractions, Left Breast Boost/ 10 Gy in 5 fractions.   . Movement disorder   . MS (multiple sclerosis) ([place])    primary progressive MS  . Multiple sclerosis ([place])   . Neuropathy   . Osteoporosis   . Vision abnormalities    Past Surgical History:  Procedure Laterality Date  . BREAST LUMPECTOMY WITH RADIOACTIVE SEED AND SENTINEL LYMPH NODE BIOPSY Left 03/31/2017   Procedure: BREAST LUMPECTOMY WITH RADIOACTIVE SEED AND SENTINEL LYMPH NODE BIOPSY;  Surgeon: Stark Klein, MD;  Location: [facility];  Service: General;  Laterality: Left;  . FRACTURE SURGERY Left    hand  . I&D EXTREMITY  05/30/2011   Procedure: IRRIGATION AND DEBRIDEMENT EXTREMITY;  Surgeon: Tennis Must, MD;  Location: [facility];  Service: Orthopedics;;  Incision and drainage of open Proximal interphalangeal joint with closed reduction of Proximal interphalangeal  joint left long finger  . KNEE ARTHROSCOPY       ALLERGIES:  Allergies  Allergen Reactions  . Levaquin [Levofloxacin In D5w]   . Ppa-Mma Express [Compleat] Nausea And Vomiting    Blood in vomit and back felt like it was breaking.   Marland Kitchen Zithromax [Azithromycin] Other (See Comments)    abd cramping  . Latex Itching  . Ultram [Tramadol Hcl] Anxiety     CURRENT MEDICATIONS:  Outpatient Encounter Medications as of 10/22/2017  Medication Sig  . calcium citrate-vitamin D (CITRACAL+D) 315-200 MG-UNIT tablet Take 1 tablet by mouth 2 (two) times daily.  . cholecalciferol (VITAMIN D) 1000 units tablet Take 1,000 Units by mouth daily. Pt reports 2000 UNITS daily, somedays up to 8000 UNITS.  Marland Kitchen clonazePAM  (KLONOPIN) 1 MG tablet TAKE 4 TABLETS BY MOUTH EVERY DAY AT BEDTIME  . cyclobenzaprine (FLEXERIL) 5 MG tablet TAKE 1 TO 2 TABLETS AT BEDTIME  . escitalopram (LEXAPRO) 5 MG tablet Take 1 tablet (5 mg total) by mouth daily.  . hydrochlorothiazide (HYDRODIURIL) 12.5 MG tablet TAKE 1 TABLET BY MOUTH EVERY DAY IN THE MORNING  . letrozole (FEMARA) 2.5 MG tablet Take 1 tablet (2.5 mg total) by mouth daily.  . Omega-3 Fatty Acids (FISH OIL) 1000 MG CAPS Take by mouth.  . oxyCODONE (OXY IR/ROXICODONE) 5 MG immediate release tablet Take 1-2 tablets (5-10 mg total) by mouth every 6 (six) hours as needed.  Marland Kitchen tiZANidine (ZANAFLEX) 4 MG tablet TAKE 2 TABLETS (8 MG TOTAL) BY MOUTH AT BEDTIME.   No facility-administered encounter medications on file as of 10/22/2017.      ONCOLOGIC FAMILY HISTORY:  Family History  Problem Relation Age of Onset  . Hypertension Mother   . Breast cancer Mother 50  . Hypothyroidism  Mother   . Hyperlipidemia Father   . Diabetes Father   . Pancreatic cancer Father 62  . Hypothyroidism Sister   . Ovarian cancer Maternal Aunt        dx in her 41s  . Parkinson's disease Maternal Uncle   . Stroke Maternal Uncle   . Breast cancer Maternal Grandmother 100  . Breast cancer Maternal Aunt        dx under 71  . Breast cancer Maternal Aunt        dx over 46s  . Breast cancer Maternal Aunt        dx >50  . Breast cancer Other   . Hypothyroidism Brother   . Breast cancer Cousin   . Breast cancer Sister      GENETIC COUNSELING/TESTING: See above  SOCIAL HISTORY:  Social History   Socioeconomic History  . Marital status: Married    Spouse name: Not on file  . Number of children: Not on file  . Years of education: Not on file  . Highest education level: Not on file  Occupational History  . Not on file  Social Needs  . Financial resource strain: Not on file  . Food insecurity:    Worry: Not on file    Inability: Not on file  . Transportation needs:    Medical:  Not on file    Non-medical: Not on file  Tobacco Use  . Smoking status: Former Smoker    Packs/day: 0.50    Types: Cigarettes    Last attempt to quit: 11/25/2012    Years since quitting: 4.9  . Smokeless tobacco: Never Used  Substance and Sexual Activity  . Alcohol use: Yes    Comment: 12 alcohol drinks weekly  . Drug use: No  . Sexual activity: Not on file  Lifestyle  . Physical activity:    Days per week: Not on file    Minutes per session: Not on file  . Stress: Not on file  Relationships  . Social connections:    Talks on phone: Not on file    Gets together: Not on file    Attends religious service: Not on file    Active member of club or organization: Not on file    Attends meetings of clubs or organizations: Not on file    Relationship status: Not on file  . Intimate partner violence:    Fear of current or ex partner: Not on file    Emotionally abused: Not on file    Physically abused: Not on file    Forced sexual activity: Not on file  Other Topics Concern  . Not on file  Social History Narrative  . Not on file      PHYSICAL EXAMINATION:  Vital Signs:   Vitals:   10/22/17 1305  BP: (!) 121/92  Pulse: 98  Resp: 18  Temp: 98.4 F (36.9 C)  SpO2: 97%   Filed Weights   General: Well-nourished, well-appearing female in no acute distress.  She is unaccompanied today.   HEENT: Head is normocephalic.  Pupils equal and reactive to light. Conjunctivae clear without exudate.  Sclerae anicteric. Oral mucosa is pink, moist.  Oropharynx is pink without lesions or erythema.  Lymph: No cervical, supraclavicular, or infraclavicular lymphadenopathy noted on palpation.  Cardiovascular: Regular rate and rhythm.Marland Kitchen Respiratory: Clear to auscultation bilaterally. Chest expansion symmetric; breathing non-labored.  Breasts: right breast benign, left breast s/p lumpectomy and radiation, slightly swollen, no sign of recurrence GI: Abdomen soft  and round; non-tender,  non-distended. Bowel sounds normoactive.  GU: Deferred.  Neuro: No focal deficits. Steady gait.  Psych: Mood and affect normal and appropriate for situation.  Extremities: No edema. MSK: No focal spinal tenderness to palpation.  Full range of motion in bilateral upper extremities Skin: Warm and dry.  LABORATORY DATA:  None for this visit.  DIAGNOSTIC IMAGING:  None for this visit.      ASSESSMENT AND PLAN:  Ms.. Colan is a pleasant 57 y.o. female with Stage IA left breast invasive ductal carcinoma, ER+/PR+/HER2-, diagnosed in 02/2017, treated with lumpectomy, adjuvant radiation therapy, and anti-estrogen therapy with Letrozole beginning in 06/2017.  She presents to the [facility]orship [facility] for our initial meeting and routine follow-up post-completion of treatment for breast cancer.    1. Stage IA left breast cancer:  Ms. Saggese is continuing to recover from definitive treatment for breast cancer. She will follow-up with her medical oncologist, Dr. Lindi Adie in 4 weeks due to #2. I answered her questions about why she did not receive an oncotype in detail. Today, a comprehensive survivorship care plan and treatment summary was reviewed with the patient today detailing her breast cancer diagnosis, treatment course, potential late/long-term effects of treatment, appropriate follow-up care with recommendations for the future, and patient education resources.  A copy of this summary, along with a letter will be sent to the patient's primary care provider via mail/fax/In Basket message after today's visit.    2. Suicidal ideation on Letrozole: This is improved and is no longer happening since she has discontinued the treatment.  I recommended she NOT restart the medication.  I recommended she continue to recover and review her risk for recurrence and future anti estrogen therapy recommendations with Dr. Lindi Adie in a few weeks.    3. Breast lymphedema: She is massaging her breast with vitamin e oil and I  recommended she continue this.  It is improved as she finished with PT. I reviewed the difference in edema and lymphedema.    4. Bone health:  Given Ms. [place] age/history of breast cancer and her current treatment regimen including anti-estrogen therapy with Letrozole, she is at risk for bone demineralization.  Her last DEXA scan was 05/25/2016 and shows osteoporosis with a t score of -2.7 in the left femoral neck.  She will review further with Dr. Lindi Adie at her next appointment. She was given education on specific activities to promote bone health.  5. Cancer screening:  Due to Ms. Masin history and her age, she should receive screening for skin cancers, colon cancer, and gynecologic cancers.  The information and recommendations are listed on the patient's comprehensive care plan/treatment summary and were reviewed in detail with the patient.    6. Health maintenance and wellness promotion: Ms. Glock was encouraged to consume 5-7 servings of fruits and vegetables per day. We reviewed the "Nutrition Rainbow" handout, as well as the handout "Take Control of Your Health and Reduce Your Cancer Risk" from the [employer].  She was also encouraged to engage in moderate to vigorous exercise for 30 minutes per day most days of the week. We discussed the [employer] fitness program, which is designed for cancer survivors to help them become more physically fit after cancer treatments.  She was instructed to limit her alcohol consumption and continue to abstain from tobacco use.     7. Support services/counseling: It is not uncommon for this period of the patient's cancer care trajectory to be one of many emotions  and stressors.  We discussed an opportunity for her to participate in the next session of [facility] ("Finding Your New Normal") support group series designed for patients after they have completed treatment.   Ms. Bonnette was encouraged to take advantage of our many other support services programs,  support groups, and/or counseling in coping with her new life as a cancer survivor after completing anti-cancer treatment.  She was offered support today through active listening and expressive supportive counseling.  She was given information regarding our available services and encouraged to contact me with any questions or for help enrolling in any of our support group/programs.    Dispo:   -Return to cancer center in 4 weeks for f/u with Dr. Lindi Adie  -Mammogram due in 11/2017 -Follow up with surgery per Dr. Barry Dienes -She is welcome to return back to the Survivorship Clinic at any time; no additional follow-up needed at this time.  -Consider referral back to survivorship as a long-term survivor for continued surveillance  A total of (30) minutes of face-to-face time was spent with this patient with greater than 50% of that time in counseling and care-coordination.   Gardenia Phlegm, [place] [phone]   Note: PRIMARY CARE PROVIDER Shirline Frees, [place] [phone]

## 2017-11-16 ENCOUNTER — Telehealth: Payer: Self-pay | Admitting: Hematology and Oncology

## 2017-11-16 ENCOUNTER — Inpatient Hospital Stay (HOSPITAL_BASED_OUTPATIENT_CLINIC_OR_DEPARTMENT_OTHER): Payer: 59 | Admitting: Hematology and Oncology

## 2017-11-16 DIAGNOSIS — C50412 Malignant neoplasm of upper-outer quadrant of left female breast: Secondary | ICD-10-CM | POA: Diagnosis not present

## 2017-11-16 DIAGNOSIS — Z79811 Long term (current) use of aromatase inhibitors: Secondary | ICD-10-CM

## 2017-11-16 DIAGNOSIS — R197 Diarrhea, unspecified: Secondary | ICD-10-CM

## 2017-11-16 DIAGNOSIS — J811 Chronic pulmonary edema: Secondary | ICD-10-CM

## 2017-11-16 DIAGNOSIS — Z510 Encounter for antineoplastic radiation therapy: Secondary | ICD-10-CM

## 2017-11-16 DIAGNOSIS — R109 Unspecified abdominal pain: Secondary | ICD-10-CM | POA: Diagnosis not present

## 2017-11-16 DIAGNOSIS — Z170 Estrogen receptor positive status [ER+]: Secondary | ICD-10-CM | POA: Diagnosis not present

## 2017-11-16 DIAGNOSIS — Z8041 Family history of malignant neoplasm of ovary: Secondary | ICD-10-CM

## 2017-11-16 DIAGNOSIS — I890 Lymphedema, not elsewhere classified: Secondary | ICD-10-CM

## 2017-11-16 DIAGNOSIS — Z803 Family history of malignant neoplasm of breast: Secondary | ICD-10-CM

## 2017-11-16 DIAGNOSIS — Z79899 Other long term (current) drug therapy: Secondary | ICD-10-CM

## 2017-11-16 DIAGNOSIS — Z87891 Personal history of nicotine dependence: Secondary | ICD-10-CM

## 2017-11-16 DIAGNOSIS — K5900 Constipation, unspecified: Secondary | ICD-10-CM

## 2017-11-16 DIAGNOSIS — M810 Age-related osteoporosis without current pathological fracture: Secondary | ICD-10-CM

## 2017-11-16 DIAGNOSIS — Z800 Family history of malignant neoplasm of digestive organs: Secondary | ICD-10-CM

## 2017-11-16 MED ORDER — TAMOXIFEN CITRATE 20 MG PO TABS: 20.0000 mg | ORAL_TABLET | Freq: Every day | ORAL | 3 refills | Status: DC

## 2017-11-16 NOTE — Telephone Encounter (Signed)
Gave avs and calendar ° °

## 2017-11-16 NOTE — Progress Notes (Signed)
Patient Care Team: Shirline Frees, MD as PCP - General (Family Medicine) Stark Klein, MD as Consulting Physician (General Surgery) Eppie Gibson, MD as Attending Physician (Radiation Oncology) Nicholas Lose, MD as Consulting Physician (Hematology and Oncology) Gardenia Phlegm, NP as Nurse Practitioner (Hematology and Oncology)  DIAGNOSIS:    ICD-10-CM   1. Malignant neoplasm of upper-outer quadrant of left breast in female, estrogen receptor positive [facility]) C50.412 CT Abdomen Pelvis W Contrast   Z17.0 CT Chest W Contrast    SUMMARY OF ONCOLOGIC HISTORY:   Malignant neoplasm of upper-outer quadrant of left breast in female, estrogen receptor positive ([place])   12/18/2015 Genetic Testing    Seen by genetic counselors for testing due to [place] of pancreatic, ovarian, and breast cancer.  Genetic testing was negative and revealed no deleterious mutation.  Genes tested include: APC, ATM, BARD1, BRCA1, BRCA2, BRIP1, CDH1, CDK4, CDKN2A, CHEK2, EPCAM, FANCC, MLH1, MSH2, MSH6, NBN, PALB2, PMS2, PTEN, RAD51C, RAD51D, STK11, TP53, VHL, and XRCC2.    03/04/2017 Initial Diagnosis    Screening detected 4 mm spiculated mass: left breast biopsy 2 o'clock position: IDC grade 2 With DCIS ER 100%, PR 20%, Ki-67 10%, HER-2 positive ratio 2.96, T1 a N0 stage I a    03/31/2017 Surgery    Left lumpectomy: IDC 0.5 cm, grade 3, DCIS high-grade, margins negative, 0/2 lymph nodes negative, ER 100%, PR 20%, Ki-67 10%, HER-2 positive ratio 2.96 T1 a N0 stage I a    05/20/2017 - 06/17/2017 Radiation Therapy    Adjuvant radiation therapy: 1. Left Breast / 40.05 Gy in 15 fractions.  2. Left Breast Boost / 10 Gy in 5 fractions    07/2017 - 08/2017 Anti-estrogen oral therapy    Letrozole daily-unable to tolerate    09/15/2017 Cancer Staging    Staging form: Breast, AJCC 8th Edition - Pathologic: Stage IA (pT1a, pN0, cM0, G3, ER+, PR+, HER2-) - Signed by Gardenia Phlegm, NP on 09/15/2017     CHIEF  COMPLIANT: Follow up on surveillance and discuss other anti-estrogen treatment options.   INTERVAL HISTORY: Taylor Hancock is a 57 y.o. with above-mentioned history of left breast cancer treated with lumpectomy and radiation. She continues on surveillance at this time.  She reports that she had a rough time with letrozole with significant worsening of her depression and even suicidal ideation.  The symptoms improved after she stopped it.  She developed lower extremity swelling and that led to decrease in her physical activity.  She was diagnosed with pulmonary edema with associated symptoms of bilateral ankle/feet swelling, which was contributed to lack of mobility due to increased joint pain. She was placed on HCTZ and she has an at home blood pressure cuff.    REVIEW OF SYSTEMS:   Constitutional: Denies fevers, chills or abnormal weight loss Eyes: Denies blurriness of vision Ears, nose, mouth, throat, and face: Denies mucositis or sore throat Respiratory: Denies cough, dyspnea or wheezes Cardiovascular: Denies palpitation, chest discomfort Gastrointestinal:  Denies nausea, heartburn or change in bowel habits Skin: Denies abnormal skin rashes Lymphatics: Denies new lymphadenopathy or easy bruising Neurological:Denies numbness, tingling or new weaknesses Behavioral/Psych: Mood is stable, no new changes  Extremities: (+) resolved lower extremity edema (+) increased joint pain Breast: denies any pain or lumps or nodules in either breasts All other systems were reviewed with the patient and are negative.  I have reviewed the past medical history, past surgical history, social history and family history with the patient and they are  unchanged from previous note.  ALLERGIES:  is allergic to letrozole; levaquin [levofloxacin in d5w]; ppa-mma express [compleat]; zithromax [azithromycin]; latex; and ultram [tramadol hcl].  MEDICATIONS:  Current Outpatient Medications  Medication Sig Dispense  Refill  . calcium citrate-vitamin D (CITRACAL+D) 315-200 MG-UNIT tablet Take 1 tablet by mouth 2 (two) times daily.    . cholecalciferol (VITAMIN D) 1000 units tablet Take 1,000 Units by mouth daily. Pt reports 2000 UNITS daily, somedays up to 8000 UNITS.    Marland Kitchen clonazePAM (KLONOPIN) 1 MG tablet TAKE 4 TABLETS BY MOUTH EVERY DAY AT BEDTIME 360 tablet 1  . cyclobenzaprine (FLEXERIL) 5 MG tablet TAKE 1 TO 2 TABLETS AT BEDTIME 60 tablet 5  . escitalopram (LEXAPRO) 5 MG tablet Take 1 tablet (5 mg total) by mouth daily. 90 tablet 4  . hydrochlorothiazide (HYDRODIURIL) 12.5 MG tablet TAKE 1 TABLET BY MOUTH EVERY DAY IN THE MORNING  1  . Omega-3 Fatty Acids (FISH OIL) 1000 MG CAPS Take by mouth.    . oxyCODONE (OXY IR/ROXICODONE) 5 MG immediate release tablet Take 1-2 tablets (5-10 mg total) by mouth every 6 (six) hours as needed. 20 tablet 0  . tamoxifen (NOLVADEX) 20 MG tablet Take 1 tablet (20 mg total) by mouth daily. 90 tablet 3  . tiZANidine (ZANAFLEX) 4 MG tablet TAKE 2 TABLETS (8 MG TOTAL) BY MOUTH AT BEDTIME. 180 tablet 0   No current facility-administered medications for this visit.     PHYSICAL EXAMINATION: ECOG PERFORMANCE STATUS: 2 - Symptomatic, <50% confined to bed  Vitals:   11/16/17 1415  BP: 124/86  Pulse: (!) 106  Resp: 18  Temp: 98.1 F (36.7 C)  SpO2: 97%   There were no vitals filed for this visit.  GENERAL:alert, no distress and comfortable SKIN: skin color, texture, turgor are normal, no rashes or significant lesions EYES: normal, Conjunctiva are pink and non-injected, sclera clear OROPHARYNX:no exudate, no erythema and lips, buccal mucosa, and tongue normal  NECK: supple, thyroid normal size, non-tender, without nodularity LYMPH:  no palpable lymphadenopathy in the cervical, axillary or inguinal LUNGS: clear to auscultation and percussion with normal breathing effort HEART: regular rate & rhythm and no murmurs and no lower extremity edema ABDOMEN:abdomen soft,  non-tender and normal bowel sounds MUSCULOSKELETAL:no cyanosis of digits and no clubbing  NEURO: alert & oriented x 3 with fluent speech, no focal motor/sensory deficits EXTREMITIES: No lower extremity edema   LABORATORY DATA:  I have reviewed the data as listed CMP Latest Ref Rng & Units 03/23/2017 03/10/2017 01/09/2015  Glucose 65 - 99 mg/dL 191(H) 102 98  BUN 6 - 20 mg/dL _0 Creatinine 0.44 - 1.00 mg/dL 0.79 0.77 0.63  Sodium 135 - 145 mmol/L 138 143 140  Potassium 3.5 - 5.1 mmol/L 4.7 4.2 4.2  Chloride 101 - 111 mmol/L 101 105 105  CO2 22 - 32 mmol/L _1 Calcium 8.9 - 10.3 mg/dL 9.6 9.6 8.6(L)  Total Protein 6.5 - 8.1 g/dL 7.3 7.5 7.4  Total Bilirubin 0.3 - 1.2 mg/dL 1.4(H) 0.6 0.4  Alkaline Phos 38 - 126 U/L 53 60 60  AST 15 - 41 U/L 58(H) 43(H) 98(H)  ALT 14 - 54 U/L 44 35 118(H)    Lab Results  Component Value Date   WBC 8.3 03/23/2017   HGB 15.9 (H) 03/23/2017   HCT 44.6 03/23/2017   MCV 103.5 (H) 03/23/2017   PLT 287 03/23/2017   NEUTROABS 3.3 03/10/2017    ASSESSMENT &  PLAN:  Malignant neoplasm of upper-outer quadrant of left breast in female, estrogen receptor positive ([place]) 03/31/2017:Left lumpectomy: IDC 0.5 cm, grade 3, DCIS high-grade, margins negative, 0/2 lymph nodes negative, ER 100%, PR 20%, Ki-67 10%, HER-2 positive ratio 2.96 T1 a N0 stage I a  Adjuvant radiation therapy 05/20/2017-06/15/2017 Patient did not want to go on single agent Herceptin Being HER-2 positive, she did not need Oncotype testing.  Treatment plan:  1.    Patient could not tolerate letrozole: Because suicidal ideation depression symptoms We discussed other antiestrogen treatment options. We decided to try tamoxifen.  She will take half a tablet daily.  If she tolerates it well we can slowly increase it.    Multiple sclerosis: Follows with neurology. Given that her breast cancer was detected through the CT scan and monitor mammogram, we would like to obtain a CT of her chest  abdomen pelvis.  She does have some shortness of breath and some abdominal symptoms that warrant obtaining a CT scan for staging purposes.  I still recommended that she get a mammogram.  She will call and make that appointment.  Return to clinic in 1 month with follow-up after CT scans.      Orders Placed This Encounter  Procedures  . CT Abdomen Pelvis W Contrast    Standing Status:   Future    Standing Expiration Date:   11/16/2018    Order Specific Question:   ** REASON FOR EXAM (FREE TEXT)    Answer:   Abdominal pain with H/O breast cancer    Order Specific Question:   If indicated for the ordered procedure, I authorize the administration of contrast media per Radiology protocol    Answer:   Yes    Order Specific Question:   Is patient pregnant?    Answer:   No    Order Specific Question:   Preferred imaging location?    Answer:   [facility]    Order Specific Question:   Is Oral Contrast requested for this exam?    Answer:   Yes, Per Radiology protocol    Order Specific Question:   Radiology Contrast Protocol - do NOT remove file path    Answer:   \\[email or web address].pdf  . CT Chest W Contrast    Standing Status:   Future    Standing Expiration Date:   11/16/2018    Order Specific Question:   ** REASON FOR EXAM (FREE TEXT)    Answer:   Shortness of breath with H/O breast cancer    Order Specific Question:   If indicated for the ordered procedure, I authorize the administration of contrast media per Radiology protocol    Answer:   Yes    Order Specific Question:   Is patient pregnant?    Answer:   No    Order Specific Question:   Preferred imaging location?    Answer:   [facility]    Order Specific Question:   Radiology Contrast Protocol - do NOT remove file path    Answer:   \\[email or web address]\CTProtocols.pdf   The patient has a good understanding of the overall plan. she agrees with it. she will call with any problems that  may develop before the next visit here.  Nicholas Lose, MD 11/16/2017  I, Soijett Blue am acting as scribe for Dr. Nicholas Lose.  I have reviewed the above documentation for accuracy and completeness, and I agree with the above.

## 2017-11-16 NOTE — Assessment & Plan Note (Addendum)
03/31/2017:Left lumpectomy: IDC 0.5 cm, grade 3, DCIS high-grade, margins negative, 0/2 lymph nodes negative, ER 100%, PR 20%, Ki-67 10%, HER-2 positive ratio 2.96 T1 a N0 stage I a  Adjuvant radiation therapy 05/20/2017-06/15/2017 Patient did not want to go on single agent Herceptin Being HER-2 positive, she did not need Oncotype testing.  Treatment plan:  1.    Patient could not tolerate letrozole: Because suicidal ideation depression symptoms We discussed other antiestrogen treatment options. We decided to try tamoxifen.  She will take half a tablet daily.  If she tolerates it well we can slowly increase it.    Multiple sclerosis: Follows with neurology. Given that her breast cancer was detected through the CT scan and monitor mammogram, we would like to obtain a CT of her chest abdomen pelvis.  She does have some shortness of breath and some abdominal symptoms that warrant obtaining a CT scan for staging purposes.  I still recommended that she get a mammogram.  She will call and make that appointment.  Return to clinic in 1 month with follow-up after CT scans.

## 2017-11-23 ENCOUNTER — Telehealth: Payer: Self-pay | Admitting: Neurology

## 2017-11-23 NOTE — Telephone Encounter (Signed)
Spoke with Taylor Hancock. She would like clearance to exercise. Sts. new oncology drug (Temoxifen) has risk of blood clots, so she wants to start exercise program to help prevent this. Would like clearance for exercise faxed to [facility]. Advised will check with RAS for clearance and call her back, likely tomorrow. She is agreeable with this/fim

## 2017-11-23 NOTE — Telephone Encounter (Signed)
Pt has a recent diagnosis of breast cancer and the medication she will be put on could cause blood clots. Pt's other provider is wanting the okay from Dr Felecia Shelling that pt begins going to a fitness center.  Pt also asking for a call to discuss new medication she will be on for  breast cancer

## 2017-11-23 NOTE — Telephone Encounter (Signed)
Ok per Dr. Felecia Shelling to write letter giving clearance.

## 2017-11-24 ENCOUNTER — Encounter: Payer: Self-pay | Admitting: *Deleted

## 2017-11-24 NOTE — Telephone Encounter (Signed)
Letter printed, waiting on MD signature °

## 2017-11-24 NOTE — Telephone Encounter (Signed)
LMOM (identified vm) that [facility] approved an exercise program for her.  Letter has been faxed to [facility], fax# [phone]/fim

## 2017-12-08 ENCOUNTER — Telehealth: Payer: Self-pay | Admitting: *Deleted

## 2017-12-08 ENCOUNTER — Other Ambulatory Visit: Payer: Self-pay | Admitting: Neurology

## 2017-12-08 ENCOUNTER — Telehealth: Payer: Self-pay | Admitting: Neurology

## 2017-12-08 DIAGNOSIS — R261 Paralytic gait: Secondary | ICD-10-CM

## 2017-12-08 DIAGNOSIS — G35 Multiple sclerosis: Secondary | ICD-10-CM

## 2017-12-08 NOTE — Addendum Note (Signed)
Addended by: Hope Pigeon on: 12/08/2017 03:45 PM   Modules accepted: Orders

## 2017-12-08 NOTE — Telephone Encounter (Signed)
Pt is calling re: her being authorized for a scooter.  Pt wants Dr Felecia Shelling to know that when doing the authorization please request one that has the batter charger on the tiller.  The other kinds where charger is on the base could cause pt's with MS to fall and hurt themselves.

## 2017-12-08 NOTE — Telephone Encounter (Signed)
Faxed printed/signed rx clonazepam to [facility] at [phone]. Received fax confirmation.

## 2017-12-08 NOTE — Telephone Encounter (Signed)
I called patient back. She has prescription for electric scooter back from July but cannot bring to [facility]. She would like to go through [employer]. Advised they typically require OV notes within last 30  Days for insurance purposes. Offered to refer her to physical therapy to have evaluation for electric scooter. They will be able to provide notes for insurances purposes to try and get this covered. She lives in [place], [place] Will refer to: [place] at [facility] [phone].   Asked her to call me back if she does not hear about scheduling. She verbalized understanding.

## 2017-12-14 ENCOUNTER — Inpatient Hospital Stay: Payer: 59 | Admitting: Hematology and Oncology

## 2017-12-17 ENCOUNTER — Ambulatory Visit (HOSPITAL_COMMUNITY): Admission: RE | Admit: 2017-12-17 | Payer: 59 | Source: Ambulatory Visit

## 2017-12-18 ENCOUNTER — Other Ambulatory Visit: Payer: Self-pay | Admitting: Neurology

## 2017-12-21 ENCOUNTER — Ambulatory Visit (HOSPITAL_COMMUNITY)
Admission: RE | Admit: 2017-12-21 | Discharge: 2017-12-21 | Disposition: A | Discharge status: Home / Self Care | Payer: 59 | Source: Ambulatory Visit | Attending: Hematology and Oncology | Admitting: Hematology and Oncology

## 2017-12-21 DIAGNOSIS — Z170 Estrogen receptor positive status [ER+]: Secondary | ICD-10-CM | POA: Diagnosis present

## 2017-12-21 DIAGNOSIS — C50412 Malignant neoplasm of upper-outer quadrant of left female breast: Secondary | ICD-10-CM | POA: Insufficient documentation

## 2017-12-21 DIAGNOSIS — C50919 Malignant neoplasm of unspecified site of unspecified female breast: Secondary | ICD-10-CM | POA: Diagnosis not present

## 2017-12-21 MED ORDER — SODIUM CHLORIDE (PF) 0.9 % IJ SOLN
INTRAMUSCULAR | Status: AC
  Filled 2017-12-21: qty 50

## 2017-12-21 MED ORDER — IOHEXOL 300 MG/ML  SOLN
100.0000 mL | Freq: Once | INTRAMUSCULAR | Status: AC | PRN
  Administered 2017-12-21: 100 mL via INTRAVENOUS

## 2017-12-22 ENCOUNTER — Telehealth: Payer: Self-pay | Admitting: Hematology and Oncology

## 2017-12-22 NOTE — Telephone Encounter (Signed)
Pt requesting a call, stating she hasn't been able to get her scooter. Stating she has gotten the "run around". Requesting a call to discuss getting her 3 wheel scooter. Please advise.

## 2017-12-22 NOTE — Telephone Encounter (Signed)
I called pt and relayed information below. Pt will f/u with Angie. I provided phone number for her to call them Phone: [phone]. She will call back if she has further questions or concerns.

## 2017-12-22 NOTE — Telephone Encounter (Signed)
Called, LVM  at [place] at [facility] [phone]. Asked them to call back to discuss pt

## 2017-12-22 NOTE — Telephone Encounter (Signed)
Taylor Hancock called back from [facility] [address] [place] location and stated they do not do evaluations there. Pt has to come to neuro-rehab center in [place], [place]. I called and spoke with Angie. She stated she gave pt [facility] contact info so she could see if they had her specific scooter she is requesting. If they do, she needs to contact her back and then she can schedule an appt with her and [facility] to do the evaluation. If [facility] does not have what she is requesting she should call Angie back to get other contact information for other medical equipment companies. She cannot schedule until she knows what company she is going to use. Advised I will call patient to go over this again with her.  Angie will also f/u with pt.

## 2017-12-22 NOTE — Telephone Encounter (Signed)
I informed the patient that the CT scan showed what appeared to be either a polyp or a stool in the cecum near the ileocecal valve. Her last colonoscopy was 10 years ago.  I encouraged her to see her primary care physician or the gastroenterologist to discuss if she needs another colonoscopy. Otherwise no evidence of metastatic disease.  She has a small cyst in the liver.

## 2017-12-22 NOTE — Telephone Encounter (Signed)
I called patient back. She states she spoke with PT place this am and they told her they cannot do evaluation.  The number they gave her for [facility] did not work. I provided her with new number: [phone] to call and inquire about electric scooter with her insurance.  Advised I will call Levada Dy back with PT to see what is going on and call her back before the end of today to update her. She verbalized understanding.

## 2017-12-28 ENCOUNTER — Other Ambulatory Visit: Payer: Self-pay | Admitting: Obstetrics and Gynecology

## 2018-01-04 DIAGNOSIS — Z6828 Body mass index (BMI) 28.0-28.9, adult: Secondary | ICD-10-CM | POA: Diagnosis not present

## 2018-01-04 DIAGNOSIS — Z01419 Encounter for gynecological examination (general) (routine) without abnormal findings: Secondary | ICD-10-CM | POA: Diagnosis not present

## 2018-01-31 DIAGNOSIS — K219 Gastro-esophageal reflux disease without esophagitis: Secondary | ICD-10-CM | POA: Diagnosis not present

## 2018-01-31 DIAGNOSIS — R933 Abnormal findings on diagnostic imaging of other parts of digestive tract: Secondary | ICD-10-CM | POA: Diagnosis not present

## 2018-02-10 ENCOUNTER — Ambulatory Visit (INDEPENDENT_AMBULATORY_CARE_PROVIDER_SITE_OTHER): Payer: 59 | Admitting: Neurology

## 2018-02-10 ENCOUNTER — Encounter: Payer: Self-pay | Admitting: Neurology

## 2018-02-10 VITALS — BP 138/85 | HR 82 | Ht 64.0 in | Wt 176.5 lb

## 2018-02-10 DIAGNOSIS — R3911 Hesitancy of micturition: Secondary | ICD-10-CM

## 2018-02-10 DIAGNOSIS — M21372 Foot drop, left foot: Secondary | ICD-10-CM | POA: Diagnosis not present

## 2018-02-10 DIAGNOSIS — G35 Multiple sclerosis: Secondary | ICD-10-CM | POA: Diagnosis not present

## 2018-02-10 DIAGNOSIS — R296 Repeated falls: Secondary | ICD-10-CM

## 2018-02-10 DIAGNOSIS — F32A Depression, unspecified: Secondary | ICD-10-CM

## 2018-02-10 DIAGNOSIS — Z170 Estrogen receptor positive status [ER+]: Secondary | ICD-10-CM

## 2018-02-10 DIAGNOSIS — F329 Major depressive disorder, single episode, unspecified: Secondary | ICD-10-CM | POA: Diagnosis not present

## 2018-02-10 DIAGNOSIS — R261 Paralytic gait: Secondary | ICD-10-CM

## 2018-02-10 DIAGNOSIS — C50412 Malignant neoplasm of upper-outer quadrant of left female breast: Secondary | ICD-10-CM

## 2018-02-10 NOTE — Progress Notes (Signed)
[facility]  PATIENT: Taylor Hancock DOB: [birth year]  REFERRING CLINICIAN: Shirline Frees   HISTORY FROM: Patient REASON FOR VISIT: PPMS   HISTORICAL  CHIEF COMPLAINT:  Chief Complaint  Patient presents with  . Follow-up    RM 13, alone. Last seen 08/10/17.   . Multiple Sclerosis    Not on a DMT  . Gait Problem    Ambulates with walker. Gait unsteady. Had four falls yesterday. Denies hitting her head. Did not get a WC. States she would have to pay out of pocket.      [facility]  PATIENT: Taylor Hancock DOB: [birth year]  REFERRING CLINICIAN: Shirline Frees   HISTORY FROM: Patient REASON FOR VISIT: PPMS   HISTORICAL  CHIEF COMPLAINT:  Chief Complaint  Patient presents with  . Follow-up    RM 13, alone. Last seen 08/10/17.   . Multiple Sclerosis    Not on a DMT  . Gait Problem    Ambulates with walker. Gait unsteady. Had four falls yesterday. Denies hitting her head. Did not get a WC. States she would have to pay out of pocket.     HISTORY OF PRESENT ILLNESS:  Taylor Hancock is a 58 year old woman with MS diagnosed in 2005.     Update 02/10/2018: She is doing about the same.   She has PPMS and is not on a DMT (the only medication approved for primary progressive MS is contraindicated in patients with active breast cancer).     She has multiple impairments from her MS.  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   She uses a walker but still has multiple falls (4 falls yesterday and averages 1 a day).      She has noted more weakness over the last year.    She notes doing less exercise since her operations for breast cancer.   She is getting more chest dysesthesias but with her breast cancer and GI issues etiology is not certain.   She denies numbness.   She has urinary retention/hesitancy and also urge incontinence.    Mobility issues: She is currently having difficulties doing activities of daily  living including going from room to room (multiple falls) to use the bathroom, prepare meals and do other simple chores.    Due to the left arm symptoms, she cannot operate a self-propelled wheelchair.  In addition she has fatigue.  Therefore she needs a power vehicle.  We discussed a power scooter and power wheelchair.  She believes she can operate the power scooter in her home especially 1 with a smaller turning radius..    She would prefer a scooter over a wheelchair.    A scooter would allow her to perform her activities of daily living.     Other issues: She notes fatigue.  This has not responded to medications in the past.  She has depression.  She stopped Lexapro as she felt she did not tolerate it well..  We discussed seeing Behavioral Health due to her stress issues revolving around her multiple sclerosis and breast cancer.  She also has breast cancer and had RadRx and Femara.  It is ER+/PR+/Her+.  The LN's were negative.   She sees Dr. Lindi Adie.   On the Femara or with radiation, she had a lot more myalgias.   She had not been on any MS DMT for many years.    She has a recent scan and some abnormality was found  so they are planning on doing on a colonoscopy.     Update 08/10/2017: She has PPMS and has had progressively worsening gait due to left hemiplegia with leg > arm weakness.  Gait is poor and she has frequent falls.  Currently, she uses a walker but continues to have falls.    She is having more trouble with her ADL's   She falls frequently and fractured her toes last year with a fall.  Due  To her leg weakness and poor gait, she has trouble getting to the bathroom in a timely manner leading to incontinence (urinary and occasionally fecal).   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      She [place] has urinary urgency/frequency.   Vision is fine.   She reports fatigue on a daily basis.    She is noting some depression.   She has mild cognitive issues,  unchanged this year.     To help her get from room to room and to do her household chores, she needs a power scooter.   She cannot walk well with a cane.   She has used a walker the past couple year but is falling and can't move well enough.   She cannot self propel a standard or lightweight wheelchair due to left arm symptoms and rapid fatigue.    Therefore, she needs a powered vehicle.   A scooter should allow her to accomplish her ADL's and to get form room to room to use the bathroom and do her simple household chores.     She also has breast cancer and had RadRx and Femara.  It is ER+/PR+/Her+.  The LN's were negative.   She sees Dr. Lindi Adie.   On the Femara or with radiation, she had a lot more myalgias.   She had not been on any MS DMT for many years.      Update 01/26/2017:   She has primary progressive MS.   As there are no great treatments for PPMS, she is off of any DMT.   Ocrelizumab has been discussed in the past but due to limited benefit and infectious risk she has decided not to start.    She is noting more weakness in her legs, left worse than right.  Has mild left arm weakness.  The right leg weakness has come on much more over the past couple years.    Gait is spastic and off balanced and she has multiple falls.    She is no longer able to take any steps without falling.   Even with her walker, she is falling.   She is doing PT Advertising account executive center at [facility]) and feels they have helped her some but her gait continues to  progressively worsen.   They have helped her make more adaptions to her house, especially in the bathroom.   She is noting more difficulty with her hands and typing is slower with more mistakes.   Handwriting is worse and her hand spasms with writing.   Grip strength is reduced.    She has more difficulty raising her hands overhead.    She is on clonazepam, Zanaflex and Flexerill  Her fatigue continues and has worsened over the past year.  She also has had more  cognitive issues with reduced executive function, focus and attention.    She often has trouble with verbal fluency.   Multi-tasking is more difficult.     She has some  depression, helped a little by Lexapro.  Constipation is a problem at times.   Has also had a few episodes of fecal incontinence.  Urinary hesitancy is worse.     She also has urinary frequency an occasional incontinence.     From 08/19/2016: MS:.  She has a primary progressive MS.  There has been steady progression of her spasticity, weakness and cognitive fog.     Gait/strength:    Hr gait is worse and the left leg seems weaker and more spastic.   She has a left foot drop but not right.    She uses a Rollator walker now almost exclusively.   The left leg is more spastic and spasticity I often painful.  Clonazepam at bedtime helps sleep and spasticity some and she now takes 4 mg at night.     Tizanidine also helped..  Baclofen and daytime muscle relaxants have been poorly tolerated.  Due to risk of side effects, she did not want to try Ampyra in the past. Her left foot drop does better with a Walk-Aide Neurostimulator on the left.      Sensory:  She gets right > left trunk dysesthesia.  This is stable.   Both of her feet feel numb but not painful  Vision:  She has not had optic neuritis.   She notes reduced depth perception compared to the past. She does not feel safe driving at night anymore.  Bowel/Bladder:    Bladder function is stable with urinary frequency and urgency.     Rarely she has urge incontinence and this has happened a few times at work.   .   She has rare UTI.   She notes bowel incontinence at times.    Fatigue/sleep;  She has fatigue, worse as day goes on and with heat.   The fatigue is both mental and physical.      She falls asleep easily but may wake up some at night.   She sometimes naps during the day (and if so, sleep is worse at night)  Mood:    She has some job and marital stress and has some depression.     She has some crying spells and can be irritable.   She reports she is not drinking much.       She is not currently on an antidepressant.  She was once on Lexapro and it helped a little bit.   We discussed restarting it (she has 10 mg pills at the house)  Cognitive:   She feels cognition is worse and she is more forgetful.    We discussed the MRI does show some atrophy from the MS which may be playing a large role.    Also fatigue is interacting with her cognitive issues.  .    She notes that she is often forgetful, worse in the afternoons.    She also has difficulties with verbal fluency. She has some issues with in executive function and has more difficulty with planning and doing complicated tasks.       We discussed work as it is becoming harder for her to focus and she has more errors (cognitive and typographical).  She sometimes forgets what she is doing and needs to start over.   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     She has physical, fatigue and cognitive issues that are all making employment difficulty.    She feels she makes errors  all day but this is worse after a few hours when the fatigue kicks in.     MS History:  She had a [place] sign in the 1990's a few times and she felt her neck was weaker at that time.  However, gait was not affected at that time.  She was diagnosed with multiple sclerosis in 2005 after presenting with several years of worsening gait. Initially, she was diagnosed with relapsing remitting MS and was placed on Betaseron. She had difficulty tolerating Betaseron but did continue to take it. Her first MRI after the diagnosis was unchanged from her previous one. A couple years later she transferred her care to me. After review of her time course of disease, it was apparent that she had a progressive MS and not a relapsing form of MS. Therefore, the Betaseron was discontinued.    REVIEW OF SYSTEMS:  Constitutional: No fevers,  chills, sweats, or change in appetite.  Notes fatigue.   Has insomnia and sleepiness Eyes: No visual changes, double vision.  Notes light sensitivity and eye pain Ear, nose and throat: No hearing loss, ear pain, nasal congestion, sore throat.    Cardiovascular: No chest pain, palpitations Respiratory:  No shortness of breath at rest or with exertion.   No wheezes GastrointestinaI: No nausea, vomiting, diarrhea, abdominal pain.  Occ fecal incontinence Genitourinary:  see above.    Musculoskeletal:  She has myalgias Integumentary: No rash, pruritus, skin lesions.   Edema in feet Neurological: as above Psychiatric: Notes depression and anxiety  Endocrine: No palpitations, diaphoresis, change in appetite, change in weigh or increased thirst Hematologic/Lymphatic:  No anemia, purpura, petechiae. Allergic/Immunologic: No itchy/runny eyes, nasal congestion, recent allergic reactions, rashes  ALLERGIES: Allergies  Allergen Reactions  . Letrozole Other (See Comments)    Suicidal ideation  . Levaquin [Levofloxacin In D5w]   . Ppa-Mma Express [Compleat] Nausea And Vomiting    Blood in vomit and back felt like it was breaking.   Marland Kitchen Zithromax [Azithromycin] Other (See Comments)    abd cramping  . Latex Itching  . Ultram [Tramadol Hcl] Anxiety    HOME MEDICATIONS: Outpatient Medications Prior to Visit  Medication Sig Dispense Refill  . calcium citrate-vitamin D (CITRACAL+D) 315-200 MG-UNIT tablet Take 1 tablet by mouth 2 (two) times daily.    . cholecalciferol (VITAMIN D) 1000 units tablet Take 1,000 Units by mouth daily. Pt reports 2000 UNITS daily, somedays up to 8000 UNITS.    Marland Kitchen clonazePAM (KLONOPIN) 1 MG tablet TAKE 4 TABS BY MOUTH EVERY DAY AT BEDTIME 360 tablet 1  . cyclobenzaprine (FLEXERIL) 5 MG tablet TAKE 1 TO 2 TABLETS AT BEDTIME 60 tablet 5  . hydrochlorothiazide (HYDRODIURIL) 12.5 MG tablet TAKE 1 TABLET BY MOUTH EVERY DAY IN THE MORNING  1  . Omega-3 Fatty Acids (FISH OIL) 1000 MG  CAPS Take by mouth.    Marland Kitchen tiZANidine (ZANAFLEX) 4 MG tablet TAKE 2 TABLETS (8 MG TOTAL) BY MOUTH AT BEDTIME. 180 tablet 0  . escitalopram (LEXAPRO) 5 MG tablet Take 1 tablet (5 mg total) by mouth daily. 90 tablet 4  . oxyCODONE (OXY IR/ROXICODONE) 5 MG immediate release tablet Take 1-2 tablets (5-10 mg total) by mouth every 6 (six) hours as needed. 20 tablet 0  . tamoxifen (NOLVADEX) 20 MG tablet Take 1 tablet (20 mg total) by mouth daily. 90 tablet 3   No facility-administered medications prior to visit.     PAST MEDICAL HISTORY: Past Medical History:  Diagnosis Date  . Anxiety   .  Complication of anesthesia    patient was awake during colonoscopy as drugs were not effective-reports that propofol is an effective analgesic for her  . Concussion   . Depression   . Family history of breast cancer   . Family history of ovarian cancer   . Family history of pancreatic cancer   . Fibromyalgia   . History of radiation therapy 05/20/17- 06/17/17   Left Breast/ 40.05 Gy in 15 fractions, Left Breast Boost/ 10 Gy in 5 fractions.   . Movement disorder   . MS (multiple sclerosis) ([place])    primary progressive MS  . Multiple sclerosis ([place])   . Neuropathy   . Osteoporosis   . Vision abnormalities     PAST SURGICAL HISTORY: Past Surgical History:  Procedure Laterality Date  . BREAST LUMPECTOMY WITH RADIOACTIVE SEED AND SENTINEL LYMPH NODE BIOPSY Left 03/31/2017   Procedure: BREAST LUMPECTOMY WITH RADIOACTIVE SEED AND SENTINEL LYMPH NODE BIOPSY;  Surgeon: Stark Klein, MD;  Location: [facility];  Service: General;  Laterality: Left;  . FRACTURE SURGERY Left    hand  . I&D EXTREMITY  05/30/2011   Procedure: IRRIGATION AND DEBRIDEMENT EXTREMITY;  Surgeon: Tennis Must, MD;  Location: [facility];  Service: Orthopedics;;  Incision and drainage of open Proximal interphalangeal joint with closed reduction of Proximal interphalangeal  joint left long finger  . KNEE ARTHROSCOPY      FAMILY HISTORY: Family  History  Problem Relation Age of Onset  . Hypertension Mother   . Breast cancer Mother 48  . Hypothyroidism Mother   . Hyperlipidemia Father   . Diabetes Father   . Pancreatic cancer Father 45  . Hypothyroidism Sister   . Ovarian cancer Maternal Aunt        dx in her 64s  . Parkinson's disease Maternal Uncle   . Stroke Maternal Uncle   . Breast cancer Maternal Grandmother 100  . Breast cancer Maternal Aunt        dx under 18  . Breast cancer Maternal Aunt        dx over 51s  . Breast cancer Maternal Aunt        dx >50  . Breast cancer Other   . Hypothyroidism Brother   . Breast cancer Cousin   . Breast cancer Sister     SOCIAL HISTORY:  Social History   Socioeconomic History  . Marital status: Married    Spouse name: Not on file  . Number of children: Not on file  . Years of education: Not on file  . Highest education level: Not on file  Occupational History  . Not on file  Social Needs  . Financial resource strain: Not on file  . Food insecurity:    Worry: Not on file    Inability: Not on file  . Transportation needs:    Medical: Not on file    Non-medical: Not on file  Tobacco Use  . Smoking status: Former Smoker    Packs/day: 0.50    Types: Cigarettes    Last attempt to quit: 11/25/2012    Years since quitting: 5.2  . Smokeless tobacco: Never Used  Substance and Sexual Activity  . Alcohol use: Yes    Comment: 12 alcohol drinks weekly  . Drug use: No  . Sexual activity: Not on file  Lifestyle  . Physical activity:    Days per week: Not on file    Minutes per session: Not on file  . Stress: Not on file  Relationships  . Social connections:    Talks on phone: Not on file    Gets together: Not on file    Attends religious service: Not on file    Active member of club or organization: Not on file    Attends meetings of clubs or organizations: Not on file    Relationship status: Not on file  . Intimate partner violence:    Fear of current or ex  partner: Not on file    Emotionally abused: Not on file    Physically abused: Not on file    Forced sexual activity: Not on file  Other Topics Concern  . Not on file  Social History Narrative  . Not on file     PHYSICAL EXAM  Vitals:   02/10/18 1445  BP: 138/85  Pulse: 82  SpO2: 98%  Weight: 176 lb 8 oz (80.1 kg)  Height: 5\' 4"  (1.626 m)    Body mass index is 30.3 kg/m.   General: The patient is well-developed and well-nourished and in mild distress.   Neurologic Exam  Mental status: The patient is alert and oriented x 3 at the time of the examination. She has reduced concentration and had trouble coming up with words at times.    Cranial nerves: Extraocular movements are full.    Facial strength and sensation is normal. Trapezius strength is norma Palatal elevation. Tongue protrusion is midline. Hearing is symmetrically   Motor:  Muscle bulk is normal. She has moderate left and mild right increased tone. Strength is 2+to 3/5 in the left leg and hip flexor and 4/5 in the right upper leg and 4+/5 in the lower right .   Right arm is 5/5 but left arm and hand are 4/5.     Sensory: She has reduced vibration in her left leg.  Touch is more symmetric  Coordination: Cerebellar testing reveals reduced left left finger-nose-finger.   She has reduced heel-to-shin on the right and isunable to do on her left  Gait and station: Station is unstable and requires bilateral support.  She can take some steps with her walker and is spastic on the left with wide stance and reduced stride.     Reflexes: Deep tendon reflexes are symmetric and normal in the arms but she has increased reflexes in both legs. Left > right,  with nonsustained clonus at the ankles, worse on the left    DIAGNOSTIC DATA (LABS, IMAGING, TESTING) - I reviewed patient records, labs, notes, testing and imaging myself where available.     ASSESSMENT AND PLAN   Multiple sclerosis, primary progressive ([place]) - Plan:  Ambulatory referral to [place], Ambulatory referral to Physical Therapy  Foot drop, left  Depression, unspecified depression type - Plan: Ambulatory referral to [place]  Frequent falls - Plan: Ambulatory referral to Physical Therapy  Spastic gait - Plan: Ambulatory referral to Physical Therapy  Malignant neoplasm of upper-outer quadrant of left breast in female, estrogen receptor positive ([place]) - Plan: Ambulatory referral to [place]  Urinary hesitancy   1.     Due to worsening gait and frequent falls,  she needs a powered vehicle.  She is able to safely operate a power scooter in her home..  I believe that that would enable her to do her activities of daily living.   2.   Referral to physical therapy in [facility].  Referral to behavioral health in [facility].     3.   She will return  to see me in 5 months, sooner if she has new or worsening neurologic symptoms.      40 minutes face-to-face evaluation with greater than 1/2 the time counseling or coordinating care about her MS, depression and mobility issues.  Sahra Converse A. Felecia Shelling, MD, PhD 07/14/6387, 3:73 PM Certified in Neurology, Clinical Neurophysiology, Sleep Medicine, Pain Medicine and Neuroimaging  [facility] [address] [place], [place] [address] [phone]'

## 2018-02-20 ENCOUNTER — Other Ambulatory Visit: Payer: Self-pay | Admitting: Neurology

## 2018-03-11 ENCOUNTER — Encounter: Payer: Self-pay | Admitting: Hematology and Oncology

## 2018-03-11 DIAGNOSIS — K635 Polyp of colon: Secondary | ICD-10-CM | POA: Diagnosis not present

## 2018-03-11 DIAGNOSIS — K319 Disease of stomach and duodenum, unspecified: Secondary | ICD-10-CM | POA: Diagnosis not present

## 2018-03-11 DIAGNOSIS — R12 Heartburn: Secondary | ICD-10-CM | POA: Diagnosis not present

## 2018-03-16 DIAGNOSIS — G35 Multiple sclerosis: Secondary | ICD-10-CM | POA: Diagnosis not present

## 2018-03-16 DIAGNOSIS — R261 Paralytic gait: Secondary | ICD-10-CM | POA: Diagnosis not present

## 2018-03-16 DIAGNOSIS — R296 Repeated falls: Secondary | ICD-10-CM | POA: Diagnosis not present

## 2018-05-05 ENCOUNTER — Other Ambulatory Visit: Payer: Self-pay | Admitting: Neurology

## 2018-05-11 DIAGNOSIS — G35 Multiple sclerosis: Secondary | ICD-10-CM | POA: Diagnosis not present

## 2018-05-11 DIAGNOSIS — K219 Gastro-esophageal reflux disease without esophagitis: Secondary | ICD-10-CM | POA: Diagnosis not present

## 2018-06-06 ENCOUNTER — Other Ambulatory Visit: Payer: Self-pay | Admitting: Neurology

## 2018-07-29 ENCOUNTER — Other Ambulatory Visit: Payer: Self-pay | Admitting: Neurology

## 2018-08-09 ENCOUNTER — Telehealth: Payer: Self-pay | Admitting: Neurology

## 2018-08-09 NOTE — Telephone Encounter (Signed)
Pt is calling in and states her referral for rehab needs to be resent

## 2018-08-10 ENCOUNTER — Telehealth: Payer: Self-pay | Admitting: Neurology

## 2018-08-10 NOTE — Telephone Encounter (Signed)
Referral resent [phone]

## 2018-08-10 NOTE — Telephone Encounter (Signed)
Called, LVM for pt letting her know I got the message she was going to do telephone visit.  I still need to update med list, pharmacy, allergies on file prior to visit.

## 2018-08-10 NOTE — Telephone Encounter (Signed)
Pt is calling in wanting to know if her appt can be a Telephone Visit , she states with the heat she doubts she will be able to leave the house

## 2018-08-10 NOTE — Telephone Encounter (Signed)
Dr. Felecia Shelling- are you ok with this? Her appt is for tomorrow.

## 2018-08-10 NOTE — Telephone Encounter (Signed)
Called, LVM for pt to call.  Dr. Felecia Shelling would prefer to do a virtual visit via mychart if she has capability. If not, okay to do telephone note. Please relay when she calls

## 2018-08-10 NOTE — Telephone Encounter (Signed)
Patient called back. I took call from phone staff. Updated med list, pharmacy, allergies for telephone visit tomorrow.

## 2018-08-10 NOTE — Telephone Encounter (Signed)
[place] for telephone visit (virtual would be better if she has a Clinical biochemist on computer)

## 2018-08-10 NOTE — Telephone Encounter (Signed)
Pt has called back and she gave verbal consent to file insurance for a tele-visit for tomorrow @ 3:00.  Pt is asking for the call on [phone].  This is Pharmacist, hospital

## 2018-08-11 ENCOUNTER — Encounter: Payer: Self-pay | Admitting: Neurology

## 2018-08-11 ENCOUNTER — Ambulatory Visit (INDEPENDENT_AMBULATORY_CARE_PROVIDER_SITE_OTHER): Payer: 59 | Admitting: Neurology

## 2018-08-11 DIAGNOSIS — R350 Frequency of micturition: Secondary | ICD-10-CM

## 2018-08-11 DIAGNOSIS — F09 Unspecified mental disorder due to known physiological condition: Secondary | ICD-10-CM

## 2018-08-11 DIAGNOSIS — G801 Spastic diplegic cerebral palsy: Secondary | ICD-10-CM

## 2018-08-11 DIAGNOSIS — F329 Major depressive disorder, single episode, unspecified: Secondary | ICD-10-CM

## 2018-08-11 DIAGNOSIS — G35 Multiple sclerosis: Secondary | ICD-10-CM | POA: Diagnosis not present

## 2018-08-11 DIAGNOSIS — F32A Depression, unspecified: Secondary | ICD-10-CM

## 2018-08-11 DIAGNOSIS — R296 Repeated falls: Secondary | ICD-10-CM

## 2018-08-11 DIAGNOSIS — R261 Paralytic gait: Secondary | ICD-10-CM

## 2018-08-11 MED ORDER — SOLIFENACIN SUCCINATE 5 MG PO TABS: 5.0000 mg | ORAL_TABLET | Freq: Every day | ORAL | 11 refills | Status: DC

## 2018-08-11 NOTE — Progress Notes (Addendum)
[facility]  PATIENT: Taylor Hancock DOB: [birth year]  REFERRING CLINICIAN: Shirline Frees   HISTORY FROM: Patient REASON FOR VISIT: PPMS   HISTORICAL  CHIEF COMPLAINT:  Chief Complaint  Patient presents with   Multiple Sclerosis   Gait Problem     [facility]  PATIENT: Taylor Hancock DOB: 1960/10/10  REFERRING CLINICIAN: Shirline Frees   HISTORY FROM: Patient REASON FOR VISIT: PPMS   HISTORICAL  CHIEF COMPLAINT:  Chief Complaint  Patient presents with   Multiple Sclerosis   Gait Problem    HISTORY OF PRESENT ILLNESS:  Taylor Hancock is a 58 year old woman with MS diagnosed in 2005.     Update 08/11/2018: Virtual Visit via Telephone Note I connected with Taylor Hancock on 08/11/18 at  3:00 PM EDT by telephone and verified that I am speaking with the correct person using two identifiers. Location: Patient: home  Provider: office  I discussed the limitations, risks, security and privacy concerns of performing an evaluation and management service by telephone and the availability of in person appointments. I also discussed with the patient that there may be a patient responsible charge related to this service. The patient expressed understanding and agreed to proceed.  History of Present Illness: She has Primary progressive MS.  She is not on any DMT Her gait has progressively worsened and she is relying more on her scooter.    She is only able to go about 15 feet with the Rollator due to her weakness and poor endurance.  She has left >> right weakness.  She has noted more issues in the last few months now having trouble getting legs in cars.   She has frequent falls and has broken her toes.    Her current scooter won't hold a charge long and the batteries are no longer made.   She was doing PT but with Covid it was stopped and she is doing much worse.     She has multiple impairments from her MS.  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   She uses a walker but still has multiple falls (4 falls yesterday and averages 1 a day).      She has noted more weakness over the last year.    She notes doing less exercise since her operations for breast cancer.   She is getting more chest dysesthesias but with her breast cancer and GI issues etiology is not certain.   She denies numbness.   She has urinary retention/hesitancy and also urge incontinence.    Mobility issues: She is currently having difficulties doing activities of daily living including going from room to room (multiple falls) to use the bathroom, prepare meals and do other simple chores.    Due to the left arm symptoms, she cannot operate a self-propelled wheelchair.  In addition she has fatigue.  Therefore she needs a power vehicle.  We discussed a power scooter and power wheelchair.  She believes she can operate the power scooter in her home especially 1 with a smaller turning radius..    She would prefer a scooter over a wheelchair.    A scooter would allow her to perform her activities of daily living.     Other issues: She notes fatigue.  This has not responded to medications in the past.  She has depression.  She stopped Lexapro as she felt she did not tolerate it well..  We discussed seeing Behavioral  Health due to her stress issues revolving around her multiple sclerosis and breast cancer. She is having more urinary problems.  She uses a bedside commode but wears Depends due to some incontinence   She has breast cancer and sees Dr. Lindi Adie. Observations/Objective: She is alert and oriented with fluent speech.     Assessment and Plan:   1. Multiple sclerosis, primary progressive (HCC)   2. 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 Mild cognitive disorder   4. Depression, unspecified depression type   5. Frequent falls   6. Spastic gait   7. Urinary frequency     1.  She needs a power scooter to address her mobility impairments and help  her do her activities of daily living.  Details are above.  Her current scooter is old and the battery would not hold a charge and it is not meeting her mobility needs. 2.   She will resume physical therapy now that PT is seeing more patients again.  Unfortunately, primary progressive MS does not respond well to disease modifying therapies and she will remain off of any specific medication. 3.    Vesicare for bladder frequency and urgency  4.   Return in 6 months or sooner if there are new or worsening neurologic symptoms.  Follow Up Instructions: I discussed the assessment and treatment plan with the patient. The patient was provided an opportunity to ask questions and all were answered. The patient agreed with the plan and demonstrated an understanding of the instructions.   The patient was advised to call back or seek an in-person evaluation if the symptoms worsen or if the condition fails to improve as anticipated.  I provided 22 minutes of non-face-to-face time during this encounter.  Update 02/10/2018: She is doing about the same.   She has PPMS and is not on a DMT (the only medication approved for primary progressive MS is contraindicated in patients with active breast cancer).     She has multiple impairments from her MS.  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   She uses a walker but still has multiple falls (4 falls yesterday and averages 1 a day).      She has noted more weakness over the last year.    She notes doing less exercise since her operations for breast cancer.   She is getting more chest dysesthesias but with her breast cancer and GI issues etiology is not certain.   She denies numbness.   She has urinary retention/hesitancy and also urge incontinence.    Mobility issues: She is currently having difficulties doing activities of daily living including going from room to room (multiple falls) to use the bathroom, prepare meals and do other simple  chores.    Due to the left arm symptoms, she cannot operate a self-propelled wheelchair.  In addition she has fatigue.  Therefore she needs a power vehicle.  We discussed a power scooter and power wheelchair.  She believes she can operate the power scooter in her home especially 1 with a smaller turning radius..    She would prefer a scooter over a wheelchair.    A scooter would allow her to perform her activities of daily living.     Other issues: She notes fatigue.  This has not responded to medications in the past.  She has depression.  She stopped Lexapro as she felt she did not tolerate it well..  We discussed seeing Behavioral Health due  to her stress issues revolving around her multiple sclerosis and breast cancer.  She also has breast cancer and had RadRx and Femara.  It is ER+/PR+/Her+.  The LN's were negative.   She sees Dr. Lindi Adie.   On the Femara or with radiation, she had a lot more myalgias.   She had not been on any MS DMT for many years.    She has a recent scan and some abnormality was found so they are planning on doing on a colonoscopy.     Update 08/10/2017: She has PPMS and has had progressively worsening gait due to left hemiplegia with leg > arm weakness.  Gait is poor and she has frequent falls.  Currently, she uses a walker but continues to have falls.    She is having more trouble with her ADL's   She falls frequently and fractured her toes last year with a fall.  Due  To her leg weakness and poor gait, she has trouble getting to the bathroom in a timely manner leading to incontinence (urinary and occasionally fecal).   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      She [place] has urinary urgency/frequency.   Vision is fine.   She reports fatigue on a daily basis.    She is noting some depression.   She has mild cognitive issues, unchanged this year.     To help her get from room to room and to do her household chores, she needs a power scooter.    She cannot walk well with a cane.   She has used a walker the past couple year but is falling and can't move well enough.   She cannot self propel a standard or lightweight wheelchair due to left arm symptoms and rapid fatigue.    Therefore, she needs a powered vehicle.   A scooter should allow her to accomplish her ADL's and to get form room to room to use the bathroom and do her simple household chores.     She also has breast cancer and had RadRx and Femara.  It is ER+/PR+/Her+.  The LN's were negative.   She sees Dr. Lindi Adie.   On the Femara or with radiation, she had a lot more myalgias.   She had not been on any MS DMT for many years.      Update 01/26/2017:   She has primary progressive MS.   As there are no great treatments for PPMS, she is off of any DMT.   Ocrelizumab has been discussed in the past but due to limited benefit and infectious risk she has decided not to start.    She is noting more weakness in her legs, left worse than right.  Has mild left arm weakness.  The right leg weakness has come on much more over the past couple years.    Gait is spastic and off balanced and she has multiple falls.    She is no longer able to take any steps without falling.   Even with her walker, she is falling.   She is doing PT Advertising account executive center at [facility]) and feels they have helped her some but her gait continues to  progressively worsen.   They have helped her make more adaptions to her house, especially in the bathroom.   She is noting more difficulty with her hands and typing is slower with more mistakes.   Handwriting is worse and her hand spasms with  writing.   Grip strength is reduced.    She has more difficulty raising her hands overhead.    She is on clonazepam, Zanaflex and Flexerill  Her fatigue continues and has worsened over the past year.  She also has had more cognitive issues with reduced executive function, focus and attention.    She often has trouble with verbal fluency.    Multi-tasking is more difficult.     She has some depression, helped a little by Lexapro.  Constipation is a problem at times.   Has also had a few episodes of fecal incontinence.  Urinary hesitancy is worse.     She also has urinary frequency an occasional incontinence.     From 08/19/2016: MS:.  She has a primary progressive MS.  There has been steady progression of her spasticity, weakness and cognitive fog.     Gait/strength:    Hr gait is worse and the left leg seems weaker and more spastic.   She has a left foot drop but not right.    She uses a Rollator walker now almost exclusively.   The left leg is more spastic and spasticity I often painful.  Clonazepam at bedtime helps sleep and spasticity some and she now takes 4 mg at night.     Tizanidine also helped..  Baclofen and daytime muscle relaxants have been poorly tolerated.  Due to risk of side effects, she did not want to try Ampyra in the past. Her left foot drop does better with a Walk-Aide Neurostimulator on the left.      Sensory:  She gets right > left trunk dysesthesia.  This is stable.   Both of her feet feel numb but not painful  Vision:  She has not had optic neuritis.   She notes reduced depth perception compared to the past. She does not feel safe driving at night anymore.  Bowel/Bladder:    Bladder function is stable with urinary frequency and urgency.     Rarely she has urge incontinence and this has happened a few times at work.   .   She has rare UTI.   She notes bowel incontinence at times.    Fatigue/sleep;  She has fatigue, worse as day goes on and with heat.   The fatigue is both mental and physical.      She falls asleep easily but may wake up some at night.   She sometimes naps during the day (and if so, sleep is worse at night)  Mood:    She has some job and marital stress and has some depression.    She has some crying spells and can be irritable.   She reports she is not drinking much.       She is not currently on  an antidepressant.  She was once on Lexapro and it helped a little bit.   We discussed restarting it (she has 10 mg pills at the house)  Cognitive:   She feels cognition is worse and she is more forgetful.    We discussed the MRI does show some atrophy from the MS which may be playing a large role.    Also fatigue is interacting with her cognitive issues.  .    She notes that she is often forgetful, worse in the afternoons.    She also has difficulties with verbal fluency. She has some issues with in executive function and has more difficulty with planning and doing complicated tasks.  We discussed work as it is becoming harder for her to focus and she has more errors (cognitive and typographical).  She sometimes forgets what she is doing and needs to start over.   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     She has physical, fatigue and cognitive issues that are all making employment difficulty.    She feels she makes errors all day but this is worse after a few hours when the fatigue kicks in.     MS History:  She had a [place] sign in the 1990's a few times and she felt her neck was weaker at that time.  However, gait was not affected at that time.  She was diagnosed with multiple sclerosis in 2005 after presenting with several years of worsening gait. Initially, she was diagnosed with relapsing remitting MS and was placed on Betaseron. She had difficulty tolerating Betaseron but did continue to take it. Her first MRI after the diagnosis was unchanged from her previous one. A couple years later she transferred her care to me. After review of her time course of disease, it was apparent that she had a progressive MS and not a relapsing form of MS. Therefore, the Betaseron was discontinued.    REVIEW OF SYSTEMS:  Constitutional: No fevers, chills, sweats, or change in appetite.  Notes fatigue.   Has insomnia and sleepiness Eyes: No visual changes, double  vision.  Notes light sensitivity and eye pain Ear, nose and throat: No hearing loss, ear pain, nasal congestion, sore throat.    Cardiovascular: No chest pain, palpitations Respiratory:  No shortness of breath at rest or with exertion.   No wheezes GastrointestinaI: No nausea, vomiting, diarrhea, abdominal pain.  Occ fecal incontinence Genitourinary:  see above.    Musculoskeletal:  She has myalgias Integumentary: No rash, pruritus, skin lesions.   Edema in feet Neurological: as above Psychiatric: Notes depression and anxiety  Endocrine: No palpitations, diaphoresis, change in appetite, change in weigh or increased thirst Hematologic/Lymphatic:  No anemia, purpura, petechiae. Allergic/Immunologic: No itchy/runny eyes, nasal congestion, recent allergic reactions, rashes  ALLERGIES: Allergies  Allergen Reactions   Letrozole Other (See Comments)    Suicidal ideation   Levaquin [Levofloxacin In D5w]    Ppa-Mma Express [Compleat] Nausea And Vomiting    Blood in vomit and back felt like it was breaking.    Zithromax [Azithromycin] Other (See Comments)    abd cramping   Latex Itching   Ultram [Tramadol Hcl] Anxiety    HOME MEDICATIONS: Outpatient Medications Prior to Visit  Medication Sig Dispense Refill   calcium citrate-vitamin D (CITRACAL+D) 315-200 MG-UNIT tablet Take 1 tablet by mouth 2 (two) times daily.     cholecalciferol (VITAMIN D) 1000 units tablet Take 1,000 Units by mouth daily. Pt reports 2000 UNITS daily, somedays up to 8000 UNITS.     clonazePAM (KLONOPIN) 1 MG tablet TAKE 4 TABLETS BY MOUTH EVERY DAY AT BEDTIME 360 tablet 1   cyclobenzaprine (FLEXERIL) 5 MG tablet TAKE 1 TO 2 TABLETS AT BEDTIME 60 tablet 5   hydrochlorothiazide (HYDRODIURIL) 12.5 MG tablet TAKE 1 TABLET BY MOUTH EVERY DAY IN THE MORNING  1   Omega-3 Fatty Acids (FISH OIL) 1000 MG CAPS Take by mouth.     pantoprazole (PROTONIX) 40 MG tablet Take 40 mg by mouth daily.     tiZANidine  (ZANAFLEX) 4 MG tablet TAKE 2 TABLETS (8 MG TOTAL) BY MOUTH AT BEDTIME. 180 tablet  1   No facility-administered medications prior to visit.     PAST MEDICAL HISTORY: Past Medical History:  Diagnosis Date   Anxiety    Complication of anesthesia    patient was awake during colonoscopy as drugs were not effective-reports that propofol is an effective analgesic for her   Concussion    Depression    Family history of breast cancer    Family history of ovarian cancer    Family history of pancreatic cancer    Fibromyalgia    History of radiation therapy 05/20/17- 06/17/17   Left Breast/ 40.05 Gy in 15 fractions, Left Breast Boost/ 10 Gy in 5 fractions.    Movement disorder    MS (multiple sclerosis) (HCC)    primary progressive MS   Multiple sclerosis ([place])    Neuropathy    Osteoporosis    Vision abnormalities     PAST SURGICAL HISTORY: Past Surgical History:  Procedure Laterality Date   BREAST LUMPECTOMY WITH RADIOACTIVE SEED AND SENTINEL LYMPH NODE BIOPSY Left 03/31/2017   Procedure: BREAST LUMPECTOMY WITH RADIOACTIVE SEED AND SENTINEL LYMPH NODE BIOPSY;  Surgeon: Stark Klein, MD;  Location: [facility];  Service: General;  Laterality: Left;   FRACTURE SURGERY Left    hand   I&D EXTREMITY  05/30/2011   Procedure: IRRIGATION AND DEBRIDEMENT EXTREMITY;  Surgeon: Tennis Must, MD;  Location: [facility];  Service: Orthopedics;;  Incision and drainage of open Proximal interphalangeal joint with closed reduction of Proximal interphalangeal  joint left long finger   KNEE ARTHROSCOPY      FAMILY HISTORY: Family History  Problem Relation Age of Onset   Hypertension Mother    Breast cancer Mother 58   Hypothyroidism Mother    Hyperlipidemia Father    Diabetes Father    Pancreatic cancer Father 31   Hypothyroidism Sister    Ovarian cancer Maternal Aunt        dx in her 37s   Parkinson's disease Maternal Uncle    Stroke Maternal Uncle    Breast cancer Maternal  Grandmother 100   Breast cancer Maternal Aunt        dx under 36   Breast cancer Maternal Aunt        dx over 53s   Breast cancer Maternal Aunt        dx >50   Breast cancer Other    Hypothyroidism Brother    Breast cancer Cousin    Breast cancer Sister     SOCIAL HISTORY:  Social History   Socioeconomic History   Marital status: Married    Spouse name: Not on file   Number of children: Not on file   Years of education: Not on file   Highest education level: Not on file  Occupational History   Not on file  Social Needs   Financial resource strain: Not on file   Food insecurity    Worry: Not on file    Inability: Not on file   Transportation needs    Medical: Not on file    Non-medical: Not on file  Tobacco Use   Smoking status: Former Smoker    Packs/day: 0.50    Types: Cigarettes    Quit date: 11/25/2012    Years since quitting: 5.7   Smokeless tobacco: Never Used  Substance and Sexual Activity   Alcohol use: Yes    Comment: 12 alcohol drinks weekly   Drug use: No   Sexual activity: Not on file  Lifestyle  Physical activity    Days per week: Not on file    Minutes per session: Not on file   Stress: Not on file  Relationships   Social connections    Talks on phone: Not on file    Gets together: Not on file    Attends religious service: Not on file    Active member of club or organization: Not on file    Attends meetings of clubs or organizations: Not on file    Relationship status: Not on file   Intimate partner violence    Fear of current or ex partner: Not on file    Emotionally abused: Not on file    Physically abused: Not on file    Forced sexual activity: Not on file  Other Topics Concern   Not on file  Social History Narrative   Not on file     PHYSICAL EXAM  There were no vitals filed for this visit.  There is no height or weight on file to calculate BMI.   General: The patient is well-developed and  well-nourished and in mild distress.   Neurologic Exam  Mental status: The patient is alert and oriented x 3 at the time of the examination. She has reduced concentration and had trouble coming up with words at times.    Cranial nerves: Extraocular movements are full.    Facial strength and sensation is normal. Trapezius strength is norma Palatal elevation. Tongue protrusion is midline. Hearing is symmetrically   Motor:  Muscle bulk is normal. She has moderate left and mild right increased tone. Strength is 2+to 3/5 in the left leg and hip flexor and 4/5 in the right upper leg and 4+/5 in the lower right .   Right arm is 5/5 but left arm and hand are 4/5.     Sensory: She has reduced vibration in her left leg.  Touch is more symmetric  Coordination: Cerebellar testing reveals reduced left left finger-nose-finger.   She has reduced heel-to-shin on the right and isunable to do on her left  Gait and station: Station is unstable and requires bilateral support.  She can take some steps with her walker and is spastic on the left with wide stance and reduced stride.     Reflexes: Deep tendon reflexes are symmetric and normal in the arms but she has increased reflexes in both legs. Left > right,  with nonsustained clonus at the ankles, worse on the left     Taylor Hancock A. Felecia Shelling, MD, PhD 5/37/9432, 7:61 PM Certified in Neurology, Clinical Neurophysiology, Sleep Medicine, Pain Medicine and Neuroimaging  [facility] [address], [place], [place] [address] [phone]'

## 2018-08-15 ENCOUNTER — Encounter: Payer: Self-pay | Admitting: Neurology

## 2018-08-24 ENCOUNTER — Telehealth: Payer: Self-pay | Admitting: Neurology

## 2018-08-24 DIAGNOSIS — G35 Multiple sclerosis: Secondary | ICD-10-CM

## 2018-08-24 DIAGNOSIS — W19XXXA Unspecified fall, initial encounter: Secondary | ICD-10-CM

## 2018-08-24 DIAGNOSIS — R261 Paralytic gait: Secondary | ICD-10-CM

## 2018-08-24 DIAGNOSIS — S99922A Unspecified injury of left foot, initial encounter: Secondary | ICD-10-CM

## 2018-08-24 DIAGNOSIS — R296 Repeated falls: Secondary | ICD-10-CM

## 2018-08-24 NOTE — Telephone Encounter (Signed)
Dr. Sater- please advise 

## 2018-08-24 NOTE — Telephone Encounter (Signed)
Pt states that she has fallen out of the bed and is needing an xray of her L foot and is wanting to know if she can be referred to an xray place in [place] on [address]. Pt would like to speak to provider or RN to discuss the situation. Please advise.

## 2018-08-24 NOTE — Telephone Encounter (Addendum)
Called pt back. She was at rehab and reported fall that occurred last Thursday. States they noticed swollen L foot but pt states this is a chronic issue present prior to call. They told her they cannot do weight bearing activities until xray completed. She is requesting order to be placed [facility] [address] [place], [place] [address].  Provided pt their phone: [phone] to call and make appt. Pt did not want to go to ED/urgent care d/t concerns over covid-19

## 2018-08-24 NOTE — Telephone Encounter (Signed)
If she feels likelihood of fracture is high she should just go to the ED (that's my recommendation)  We can place an order if she does not go to the EDbut our orders outside of the [facility] system are not immediate as not part of the system.   Does she have a [place] primary care??

## 2018-08-26 NOTE — Telephone Encounter (Signed)
Pt is asking for a call back from RN to discuss in home therapy, please call

## 2018-08-29 ENCOUNTER — Telehealth: Payer: Self-pay | Admitting: Neurology

## 2018-08-29 NOTE — Telephone Encounter (Signed)
Pt called and stated that she will go ahead and take PT next door since she will not be able to do it in [place]. She would like to start as soon as possible. Please advise.

## 2018-08-29 NOTE — Telephone Encounter (Signed)
Placed referral as requested.

## 2018-08-29 NOTE — Addendum Note (Signed)
Addended by: Hope Pigeon on: 08/29/2018 12:05 PM   Modules accepted: Orders

## 2018-08-29 NOTE — Telephone Encounter (Signed)
Called patient back to further discuss. PT place she is going to is only 4 miles from her home but their parking lot is very steep and she feels unsafe navigating through their parking lot. She does not have anyone to help her to and from appt. She is wanting to switch to home therapy. She can barely stand, walk even with her Rolator walker. Legs are very weak. She does not have a preference for company for home health. After further discussing, she is going to call [facility] at [phone] to see if they are able to help her in and out of car. She will call back to let me know if they can help her and place new referral. If they cannot, she would like referral to home health.

## 2018-08-29 NOTE — Telephone Encounter (Signed)
Dr. Felecia Shelling- FYI. She is not going to do xray foot that we placed order for last week.

## 2018-08-29 NOTE — Telephone Encounter (Signed)
Called and spoke to patient she is not going to Have a Xray . Patient states she does not need x ray.

## 2018-09-07 ENCOUNTER — Ambulatory Visit: Payer: 59 | Attending: Neurology | Admitting: Physical Therapy

## 2018-09-07 ENCOUNTER — Other Ambulatory Visit: Payer: Self-pay

## 2018-09-07 ENCOUNTER — Encounter: Payer: Self-pay | Admitting: Physical Therapy

## 2018-09-07 DIAGNOSIS — R2689 Other abnormalities of gait and mobility: Secondary | ICD-10-CM | POA: Insufficient documentation

## 2018-09-07 DIAGNOSIS — R296 Repeated falls: Secondary | ICD-10-CM | POA: Diagnosis present

## 2018-09-07 DIAGNOSIS — M6281 Muscle weakness (generalized): Secondary | ICD-10-CM | POA: Diagnosis present

## 2018-09-07 NOTE — Therapy (Signed)
[facility] [address] [place], [place], [address] Phone: [phone]   Fax:  [phone]  Physical Therapy Evaluation  Patient Details  Name: Taylor Hancock MRN: [id number] Date of Birth: 1960/03/16 Referring Provider (PT): Britt Bottom, MD   Encounter Date: 09/07/2018  PT End of Session - 09/07/18 2133    Visit Number  1    Number of Visits  24    Date for PT Re-Evaluation  11/30/18    Authorization Type  [employer]    PT Start Time  7001    PT Stop Time  1530    PT Time Calculation (min)  45 min    Activity Tolerance  Patient tolerated treatment well    Behavior During Therapy  [facility] for tasks assessed/performed       Past Medical History:  Diagnosis Date  . Anxiety   . Complication of anesthesia    patient was awake during colonoscopy as drugs were not effective-reports that propofol is an effective analgesic for her  . Concussion   . Depression   . Family history of breast cancer   . Family history of ovarian cancer   . Family history of pancreatic cancer   . Fibromyalgia   . History of radiation therapy 05/20/17- 06/17/17   Left Breast/ 40.05 Gy in 15 fractions, Left Breast Boost/ 10 Gy in 5 fractions.   . Movement disorder   . MS (multiple sclerosis) ([place])    primary progressive MS  . Multiple sclerosis ([place])   . Neuropathy   . Osteoporosis   . Vision abnormalities     Past Surgical History:  Procedure Laterality Date  . BREAST LUMPECTOMY WITH RADIOACTIVE SEED AND SENTINEL LYMPH NODE BIOPSY Left 03/31/2017   Procedure: BREAST LUMPECTOMY WITH RADIOACTIVE SEED AND SENTINEL LYMPH NODE BIOPSY;  Surgeon: Stark Klein, MD;  Location: [facility];  Service: General;  Laterality: Left;  . FRACTURE SURGERY Left    hand  . I&D EXTREMITY  05/30/2011   Procedure: IRRIGATION AND DEBRIDEMENT EXTREMITY;  Surgeon: Tennis Must, MD;  Location: [facility];  Service: Orthopedics;;  Incision and drainage of open Proximal  interphalangeal joint with closed reduction of Proximal interphalangeal  joint left long finger  . KNEE ARTHROSCOPY      There were no vitals filed for this visit.   Subjective Assessment - 09/07/18 1454    Subjective  Relays she has progressive MS and has not exercised in about a year. She is losing her ability to walk and is dependent on her scooter most of the time. She can use her rollator to walk about 15 ft at most. She relays her scooter is 60 years old and will need a new one. There is old referral for wheelchair evaluaiton  but this is from November 2019. She says she has had over 200 falls. She wants to work on her walking, leg strength, and balance. She says she gets MS pain that ranges from 2 to 8 out of 10 in her arms due to overworking them.    Pertinent History  PMH: MS, fibromyalgia, CA with radiation therapy, neruopathy    How long can you stand comfortably?  no length of time if she does not have support    How long can you walk comfortably?  15 ft at most with rollator    Patient Stated Goals  walk better, not fall, get new power scooter    Currently in Pain?  Other (Comment)  see above        [facility] PT Assessment - 09/07/18 0001      Assessment   Medical Diagnosis  frequent falls, MS (primary progressive), spastic gait)    Referring Provider (PT)  Sater, Nanine Means, MD    Onset Date/Surgical Date  --   MS diagnoses in 2005   Hand Dominance  Right    Next MD Visit  unsure    Prior Therapy  PT in past for same thing and after radiation therapy      Precautions   Precautions  Fall      Balance Screen   Has the patient fallen in the past 6 months  Yes    How many times?  --   avg 1-2 falls a week   Has the patient had a decrease in activity level because of a fear of falling?   Yes    Is the patient reluctant to leave their home because of a fear of falling?   Yes      [place]  Private residence    Living Arrangements   Spouse/significant other    Additional Comments  4 steps to enter, has to pull herself up with her arms and step to pattern       Prior Function   Level of Independence  Needs assistance with ADLs   says she gets help with most everything, must have rollator    Vocation  On disability      Cognition   Overall Cognitive Status  Within Functional Limits for tasks assessed      Coordination   Finger Nose Finger Test  WNL    Heel Shin Test  WNL on Rt leg, not able to do on Lt due to weakness      ROM / Strength   AROM / PROM / Strength  Strength      Strength   Overall Strength Comments  Rt hip flexion 4/5, Lt hip flexion 2/5, Rt knee flexion and ext 4+/5, Lt knee flexion and extension 2+/5, Lt ankle DF 1+/5, Rt ankle DF 4/5      Balance   Balance Assessed  Yes      Standardized Balance Assessment   Standardized Balance Assessment  Timed Up and Go Test;Five Times Sit to Stand    Five times sit to stand comments   42 sec using arms to push up, standard chair      Timed Up and Go Test   Normal TUG (seconds)  48   with rollator               Objective measurements completed on examination: See above findings.      [facility] Adult PT Treatment/Exercise - 09/07/18 0001      Transfers   Transfers  Sit to Stand    Sit to Stand  5: Supervision    Sit to Stand Details (indicate cue type and reason)  needs to push up from arm rests and needs UE support      Ambulation/Gait   Ambulation/Gait  Yes    Ambulation/Gait Assistance  4: Min guard    Ambulation Distance (Feet)  20 Feet    Assistive device  Rolling walker   rollator   Gait Pattern  Decreased step length - right;Decreased step length - left;Decreased hip/knee flexion - right;Decreased hip/knee flexion - left;Decreased dorsiflexion - right;Decreased dorsiflexion - left;Ataxic;Poor foot clearance - left    Ambulation Surface  Level;Indoor  Gait velocity  very slow             PT Education - 09/07/18 2133     Education Details  PT POC, exam findings    Person(s) Educated  Patient    Methods  Explanation    Comprehension  Verbalized understanding;Need further instruction       PT Short Term Goals - 09/07/18 2146      PT SHORT TERM GOAL #1   Title  Pt will be I and compliant with HEP Target for all STG 6 weeks (10/18/28)    Status  New      PT SHORT TERM GOAL #2   Title  Pt will reduce 5TSTS by 5 sec to show improved balance and strength    Baseline  42    Status  New      PT SHORT TERM GOAL #3   Title  Pt will improve TUGby 3 seconds to show improved gait and balance.    Baseline  48    Status  New        PT Long Term Goals - 09/07/18 2148      PT LONG TERM GOAL #1   Title  Pt will be I and compliant with final HEP and have regular exercise program outside of PT. (target for all LTG's 12 weeks 11/30/18)    Status  New      PT LONG TERM GOAL #2   Title  she will improve 5TSTS test by 8 sec to show improved balance.    Baseline  42      PT LONG TERM GOAL #3   Title  She will improve TUG by at least 8 sec to show improved balance and gait    Baseline  48      PT LONG TERM GOAL #4   Title  Pt will report no more than one fall per month.    Baseline  1-2 falls per week    Status  New             Plan - 09/07/18 2135    Clinical Impression Statement  Pt referred to neurorehab for frequent falls, MS (primary progressive), and  spastic gait. She mostly depends on power scooter for locomotion and is requesting PT help to get a new one as hers is 60 years old but PT will need a new MD prescription for this. She can ambulate about 20 feet at most with rollator but is very unsteady and slow. She has  functional Estim unit for Lt drop foot. She has overall decreased leg strength Lt worse than Rt, decreased balance, decreased coordination, ataxia, difficulty with transfers and gait, and increased pain limiting her function. She will benefit from skilled PT to address her deficits.     Personal Factors and Comorbidities  Comorbidity 1;Comorbidity 2;Comorbidity 3+    Comorbidities  PMH: MS, fibromyalgia, CA with radiation therapy, neruopathy    Examination-Activity Limitations  --   any activity   Examination-Participation Restrictions  --   any physcial participation   Stability/Clinical Decision Making  Unstable/Unpredictable    Clinical Decision Making  High    Rehab Potential  Fair    PT Frequency  2x / week    PT Duration  12 weeks    PT Treatment/Interventions  ADLs/Self Care Home Management;Aquatic Therapy;Cryotherapy;Gait training;Stair training;Functional mobility training;Therapeutic activities;Therapeutic exercise;Balance training;Neuromuscular re-education;Manual techniques;Passive range of motion;Taping    PT Next Visit Plan  establish HEP for leg strength  and balance. consider further balance testing and write goals as needed, may need new script for wheelchair eval    Consulted and Agree with Plan of Care  Patient       Patient will benefit from skilled therapeutic intervention in order to improve the following deficits and impairments:  Abnormal gait, Decreased activity tolerance, Decreased balance, Decreased coordination, Decreased endurance, Decreased mobility, Decreased range of motion, Decreased strength, Difficulty walking, Postural dysfunction, Pain  Visit Diagnosis: 1. Repeated falls   2. Muscle weakness (generalized)   3. Other abnormalities of gait and mobility        Problem List Patient Active Problem List   Diagnosis Date Noted  . Malignant neoplasm of upper-outer quadrant of left breast in female, estrogen receptor positive ([place]) 03/09/2017  . Urinary hesitancy 01/27/2017  . Mild cognitive disorder 06/09/2016  . Genetic testing 12/17/2015  . Family history of breast cancer   . Family history of ovarian cancer   . Family history of pancreatic cancer   . Insomnia 06/10/2015  . Urinary frequency 06/10/2015  . Frequent falls  12/26/2014  . Snoring 12/26/2014  . Depression 08/01/2014  . Other fatigue 08/01/2014  . Multiple sclerosis, primary progressive ([place]) 02/07/2014  . Spastic gait 02/07/2014  . Spastic diplegia ([place]) 02/07/2014  . Numbness 02/07/2014  . Foot drop, left 02/07/2014    Taylor Hancock 09/07/2018, 9:52 PM  [facility] [address] [place], [place], [address] Phone: [phone]   Fax:  [phone]  Name: Taylor Hancock MRN: [id number] Date of Birth: 10-Mar-1960

## 2018-09-08 ENCOUNTER — Telehealth: Payer: Self-pay | Admitting: *Deleted

## 2018-09-08 NOTE — Telephone Encounter (Signed)
Gave DM[employer] parking packing form back to medical records to process for pt.

## 2018-09-13 ENCOUNTER — Encounter: Payer: Self-pay | Admitting: Physical Therapy

## 2018-09-13 ENCOUNTER — Other Ambulatory Visit: Payer: Self-pay | Admitting: Neurology

## 2018-09-13 ENCOUNTER — Ambulatory Visit: Payer: 59 | Admitting: Physical Therapy

## 2018-09-13 DIAGNOSIS — R296 Repeated falls: Secondary | ICD-10-CM | POA: Diagnosis not present

## 2018-09-13 DIAGNOSIS — M6281 Muscle weakness (generalized): Secondary | ICD-10-CM

## 2018-09-13 DIAGNOSIS — R2689 Other abnormalities of gait and mobility: Secondary | ICD-10-CM

## 2018-09-14 ENCOUNTER — Telehealth: Payer: Self-pay | Admitting: Physical Therapy

## 2018-09-14 NOTE — Telephone Encounter (Signed)
Dr. Felecia Shelling,   I have seen your patient Taylor Hancock for PT at [facility]. Her power scooter is her primary means of mobility in her home and can not walk more than 15 ft with her rollator. Her current one is 60 years old and is having current issues with the batteries. She has a history of many falls and is at a very high risk for future falls. She was requesting one where the charger is on the tiller for safety reasons. She will need a prescription for a new power scooter and power mobility evaluation. If you agree, please place an order in [place] in Epic or fax the order to [phone].  Thank you, Janann August, PT, DPT 09/14/18 10:49 AM

## 2018-09-14 NOTE — Therapy (Signed)
[facility] [address] [place], [place], [address] Phone: [phone]   Fax:  [phone]  Physical Therapy Treatment  Patient Details  Name: Taylor Hancock MRN: [id number] Date of Birth: Sep 09, 1960 Referring Provider (PT): Britt Bottom, MD   Encounter Date: 09/13/2018  PT End of Session - 09/14/18 0810    Visit Number  2    Number of Visits  24    Date for PT Re-Evaluation  11/30/18    Authorization Type  UHC    PT Start Time  1618    PT Stop Time  1700    PT Time Calculation (min)  42 min    Equipment Utilized During Treatment  Gait belt    Activity Tolerance  Patient tolerated treatment well    Behavior During Therapy  [facility] for tasks assessed/performed       Past Medical History:  Diagnosis Date  . Anxiety   . Complication of anesthesia    patient was awake during colonoscopy as drugs were not effective-reports that propofol is an effective analgesic for her  . Concussion   . Depression   . Family history of breast cancer   . Family history of ovarian cancer   . Family history of pancreatic cancer   . Fibromyalgia   . History of radiation therapy 05/20/17- 06/17/17   Left Breast/ 40.05 Gy in 15 fractions, Left Breast Boost/ 10 Gy in 5 fractions.   . Movement disorder   . MS (multiple sclerosis) ([place])    primary progressive MS  . Multiple sclerosis ([place])   . Neuropathy   . Osteoporosis   . Vision abnormalities     Past Surgical History:  Procedure Laterality Date  . BREAST LUMPECTOMY WITH RADIOACTIVE SEED AND SENTINEL LYMPH NODE BIOPSY Left 03/31/2017   Procedure: BREAST LUMPECTOMY WITH RADIOACTIVE SEED AND SENTINEL LYMPH NODE BIOPSY;  Surgeon: Stark Klein, MD;  Location: [facility];  Service: General;  Laterality: Left;  . FRACTURE SURGERY Left    hand  . I&D EXTREMITY  05/30/2011   Procedure: IRRIGATION AND DEBRIDEMENT EXTREMITY;  Surgeon: Tennis Must, MD;  [facility];  Service:  Orthopedics;;  Incision and drainage of open Proximal interphalangeal joint with closed reduction of Proximal interphalangeal  joint left long finger  . KNEE ARTHROSCOPY      There were no vitals filed for this visit.  Subjective Assessment - 09/13/18 1625    Subjective  Had a fall this past Sunday - was in the bathroom and had her scooter, when she took her right hand to grab the door knob, she slipped and ended up on her L side. Ribs are a little sore. No dizziness, headache or blurred vision. Husband had to come and pick her up.  When using rollator, it hurts pt's arms. Needs a new prescription for a scooter. Can not take scooter outside of house because pt has 4 steps and railing, either has to bump down with bottom or can hold onto wall and use R railing. Last had PT over a year ago.    Pertinent History  PMH: MS, fibromyalgia, CA with radiation therapy, neruopathy    How long can you stand comfortably?  no length of time if she does not have support    How long can you walk comfortably?  15 ft at most with rollator    Patient Stated Goals  walk better, not fall, get new power scooter  [facility] Adult PT Treatment/Exercise - 09/14/18 1025      Transfers   Transfers  Sit to Stand    Sit to Stand  4: Min guard    Sit to Stand Details (indicate cue type and reason)  from w/c, pushes up from arm rests with BUE, cues for anterior weight shifting and technique - 5 reps, with rest breaks due to fatigue.     Comments  Stand step transfers with rollator from w/c <> blue mat table, cueing for technique and keeping rollator close by, especially when turning, needs mod A for rollator advancement. pt performs transfer slowly.       Wheelchair Mobility   Comments  Pt arrived in manual w/c - dependent for all w/c mobility from waiting room to therapy gym      Therapeutic Activites    Therapeutic Activities  Other Therapeutic Activities      Exercises   Exercises   Other Exercises    Other Exercises   In sitting in w/c attempted seated ther-ex : isometric hip ABD on L, pt gently pushing into therapist's hand. 1 x 10 reps - therapist asked if pt's husband could help tie a yellow theraband around distal thighs for exercise for HEP - pt stated he would not be able to help her do this at home. Heel slides on L, with using pillow case under foot, pt able to kick LLE out, however needs assistance to bring it back under her, but w/ palpable hamstring contraction when performing. Again, therapist asked if husband could help her with this exercise, and she said he wouldn't be home to do so. 1 x 10 pillow squeezes for hip ADD (provide this for HEP at next visit).                PT Short Term Goals - 09/07/18 2146      PT SHORT TERM GOAL #1   Title  Pt will be I and compliant with HEP Target for all STG 6 weeks (10/18/28)    Status  New      PT SHORT TERM GOAL #2   Title  Pt will reduce 5TSTS by 5 sec to show improved balance and strength    Baseline  42    Status  New      PT SHORT TERM GOAL #3   Title  Pt will improve TUGby 3 seconds to show improved gait and balance.    Baseline  48    Status  New        PT Long Term Goals - 09/07/18 2148      PT LONG TERM GOAL #1   Title  Pt will be I and compliant with final HEP and have regular exercise program outside of PT. (target for all LTG's 12 weeks 11/30/18)    Status  New      PT LONG TERM GOAL #2   Title  she will improve 5TSTS test by 8 sec to show improved balance.    Baseline  42      PT LONG TERM GOAL #3   Title  She will improve TUG by at least 8 sec to show improved balance and gait    Baseline  48      PT LONG TERM GOAL #4   Title  Pt will report no more than one fall per month.    Baseline  1-2 falls per week    Status  New  Plan - 09/14/18 0811    Clinical Impression Statement  Focus of today's session was transfers from w/c to mat table with rollator. Attempted  ther-ex HEP seated in w/c with LLE - pt unable to perform L hip flexion/knee extension/ankle DF against gravity without compensation. No exercises given today d/t pt stating her husband would not be around to help her with exercises/set up. At next session, will futher address HEP. During stand step transfers pt needed mod A for rollator navigation and brake management. Will continue to progress towards LTGs.    Personal Factors and Comorbidities  Comorbidity 1;Comorbidity 2;Comorbidity 3+    Comorbidities  PMH: MS, fibromyalgia, CA with radiation therapy, neruopathy    Examination-Activity Limitations  --   any activity   Examination-Participation Restrictions  --   any physcial participation   Stability/Clinical Decision Making  Unstable/Unpredictable    Rehab Potential  Fair    PT Frequency  2x / week    PT Duration  12 weeks    PT Treatment/Interventions  ADLs/Self Care Home Management;Aquatic Therapy;Cryotherapy;Gait training;Stair training;Functional mobility training;Therapeutic activities;Therapeutic exercise;Balance training;Neuromuscular re-education;Manual techniques;Passive range of motion;Taping    PT Next Visit Plan  initial HEP for LE strengthening/ROM (did not give today - had success with hip ADD pillow squeezes, maybe try issuing yellow t-band for yellow hip ABD and see if pt can tie around her thighs?, and maybe DF stretch with strap in seated?, seated t band rows, anything supine), sit <> stands from higher mat table, check bed mobility - pt will need a new script/eval for power scooter (ask what vendor pt would prefer)    Consulted and Agree with Plan of Care  Patient       Patient will benefit from skilled therapeutic intervention in order to improve the following deficits and impairments:  Abnormal gait, Decreased activity tolerance, Decreased balance, Decreased coordination, Decreased endurance, Decreased mobility, Decreased range of motion, Decreased strength, Difficulty  walking, Postural dysfunction, Pain  Visit Diagnosis: Muscle weakness (generalized)  Repeated falls  Other abnormalities of gait and mobility     Problem List Patient Active Problem List   Diagnosis Date Noted  . Malignant neoplasm of upper-outer quadrant of left breast in female, estrogen receptor positive ([place]) 03/09/2017  . Urinary hesitancy 01/27/2017  . Mild cognitive disorder 06/09/2016  . Genetic testing 12/17/2015  . Family history of breast cancer   . Family history of ovarian cancer   . Family history of pancreatic cancer   . Insomnia 06/10/2015  . Urinary frequency 06/10/2015  . Frequent falls 12/26/2014  . Snoring 12/26/2014  . Depression 08/01/2014  . Other fatigue 08/01/2014  . Multiple sclerosis, primary progressive ([place]) 02/07/2014  . Spastic gait 02/07/2014  . Spastic diplegia ([place]) 02/07/2014  . Numbness 02/07/2014  . Foot drop, left 02/07/2014    Arliss Journey, PT, DPT 09/14/2018, 10:52 AM  [facility] [address] [place], [place], [address] Phone: [phone]   Fax:  [phone]  Name: BEVELY MILIUS MRN: [id number] Date of Birth: 03-17-1960

## 2018-09-15 ENCOUNTER — Ambulatory Visit: Payer: 59 | Admitting: Physical Therapy

## 2018-09-15 ENCOUNTER — Other Ambulatory Visit: Payer: Self-pay

## 2018-09-15 DIAGNOSIS — M6281 Muscle weakness (generalized): Secondary | ICD-10-CM

## 2018-09-15 DIAGNOSIS — R296 Repeated falls: Secondary | ICD-10-CM

## 2018-09-15 NOTE — Therapy (Signed)
[facility] [address] [place], [place], [address] Phone: [phone]   Fax:  [phone]  Physical Therapy Treatment  Patient Details  Name: Taylor Hancock MRN: [id number] Date of Birth: 1960/08/05 Referring Provider (PT): Britt Bottom, MD   Encounter Date: 09/15/2018  PT End of Session - 09/15/18 1456    Visit Number  3    Number of Visits  24    Date for PT Re-Evaluation  11/30/18    Authorization Type  UHC    PT Start Time  [id number]    PT Stop Time  1445    PT Time Calculation (min)  41 min    Equipment Utilized During Treatment  --   pt did not want gait belt used due to discomfort   Activity Tolerance  Patient tolerated treatment well    Behavior During Therapy  [facility] for tasks assessed/performed       Past Medical History:  Diagnosis Date  . Anxiety   . Complication of anesthesia    patient was awake during colonoscopy as drugs were not effective-reports that propofol is an effective analgesic for her  . Concussion   . Depression   . Family history of breast cancer   . Family history of ovarian cancer   . Family history of pancreatic cancer   . Fibromyalgia   . History of radiation therapy 05/20/17- 06/17/17   Left Breast/ 40.05 Gy in 15 fractions, Left Breast Boost/ 10 Gy in 5 fractions.   . Movement disorder   . MS (multiple sclerosis) ([place])    primary progressive MS  . Multiple sclerosis ([place])   . Neuropathy   . Osteoporosis   . Vision abnormalities     Past Surgical History:  Procedure Laterality Date  . BREAST LUMPECTOMY WITH RADIOACTIVE SEED AND SENTINEL LYMPH NODE BIOPSY Left 03/31/2017   Procedure: BREAST LUMPECTOMY WITH RADIOACTIVE SEED AND SENTINEL LYMPH NODE BIOPSY;  Surgeon: Stark Klein, MD;  Location: [place];  Service: General;  Laterality: Left;  . FRACTURE SURGERY Left    hand  . I&D EXTREMITY  05/30/2011   Procedure: IRRIGATION AND DEBRIDEMENT EXTREMITY;  Surgeon: Tennis Must,  MD;  Location: [facility];  Service: Orthopedics;;  Incision and drainage of open Proximal interphalangeal joint with closed reduction of Proximal interphalangeal  joint left long finger  . KNEE ARTHROSCOPY      There were no vitals filed for this visit.  Subjective Assessment - 09/15/18 1454    Subjective  Denies any falls since last visit.  Pt continues to report limited mobility at and husband working >60 hrs/week.    Pertinent History  PMH: MS, fibromyalgia, CA with radiation therapy, neruopathy    How long can you stand comfortably?  no length of time if she does not have support    How long can you walk comfortably?  15 ft at most with rollator    Patient Stated Goals  walk better, not fall, get new power scooter    Currently in Pain?  No/denies         [facility] Adult PT Treatment/Exercise - 09/15/18 0001      Bed Mobility   Bed Mobility  Rolling Right;Right Sidelying to Sit;Sit to Supine    Rolling Right  Supervision/verbal cueing    Right Sidelying to Sit  Supervision/Verbal cueing    Sit to Supine  Supervision/Verbal cueing      Transfers   Transfers  Sit to Stand;Stand to  Sit;Stand Pivot Transfers    Sit to Stand  4: Min guard;With upper extremity assist;Without upper extremity assist;From chair/3-in-1    Sit to Stand Details (indicate cue type and reason)  from mat with verbal and tactile cues for technique, positioning and weight shifting    Stand to Sit  4: Min guard;With upper extremity assist;Without upper extremity assist;To chair/3-in-1    Number of Reps  10 reps;1 set    Comments  Pt did not want to use rollator for transfer.  Performed partial stand pivot transfer with min guard assist.  Repeated sit<>stand working on decreasing UE assist, balance upon standing and controlled descent.      Ambulation/Gait   Ambulation/Gait  No    Pre-Gait Activities  Standing at rollator approx 15 seconds with the 10 reps of sit<>stand      Wheelchair Mobility   Comments  Pt arrived  in w/c in lobby.  Dependent for w/c mobility.      Exercises   Exercises  Knee/Hip;Lumbar    Other Exercises   Seated on edge of mat for hip adduction with pillow between knees and 5 second hold x 10 reps.  In supine for bridging x 10 with cues for technique and positioning with 3 second hold of bridge.  Supine hooklying hip abd/add x 10 reps bil.  Supine hooklying adduction with pillow and 3 second hold x 10 reps.             PT Education - 09/15/18 1455    Education Details  HEP, PT had asked Dr Felecia Shelling for new order for scooter.    Person(s) Educated  Patient    Methods  Explanation;Demonstration;Handout    Comprehension  Verbalized understanding       PT Short Term Goals - 09/07/18 2146      PT SHORT TERM GOAL #1   Title  Pt will be I and compliant with HEP Target for all STG 6 weeks (10/18/28)    Status  New      PT SHORT TERM GOAL #2   Title  Pt will reduce 5TSTS by 5 sec to show improved balance and strength    Baseline  42    Status  New      PT SHORT TERM GOAL #3   Title  Pt will improve TUGby 3 seconds to show improved gait and balance.    Baseline  48    Status  New        PT Long Term Goals - 09/07/18 2148      PT LONG TERM GOAL #1   Title  Pt will be I and compliant with final HEP and have regular exercise program outside of PT. (target for all LTG's 12 weeks 11/30/18)    Status  New      PT LONG TERM GOAL #2   Title  she will improve 5TSTS test by 8 sec to show improved balance.    Baseline  42      PT LONG TERM GOAL #3   Title  She will improve TUG by at least 8 sec to show improved balance and gait    Baseline  48      PT LONG TERM GOAL #4   Title  Pt will report no more than one fall per month.    Baseline  1-2 falls per week    Status  New            Plan - 09/15/18 1505  Clinical Impression Statement  Skilled session focused on developing HEP for home use along with transfer training.  Pt continues to be talkative during treatment  about her medical history and previous therapy and needs cues to redirect.  Continue PT per POC.    Personal Factors and Comorbidities  Comorbidity 1;Comorbidity 2;Comorbidity 3+    Comorbidities  PMH: MS, fibromyalgia, CA with radiation therapy, neruopathy    Examination-Activity Limitations  --   any activity   Examination-Participation Restrictions  --   any physcial participation   Stability/Clinical Decision Making  Unstable/Unpredictable    Rehab Potential  Fair    PT Frequency  2x / week    PT Duration  12 weeks    PT Treatment/Interventions  ADLs/Self Care Home Management;Aquatic Therapy;Cryotherapy;Gait training;Stair training;Functional mobility training;Therapeutic activities;Therapeutic exercise;Balance training;Neuromuscular re-education;Manual techniques;Passive range of motion;Taping    PT Next Visit Plan  Review HEP and add to/revise as needed.  ? DF stretch with strap in seated or seated t band rows.  Check bed mobility in all directions and discuss railing for home use(pt mentioned that she might need one as she struggles at home in bed). - pt will need a new script/eval for power scooter (ask what vendor pt would prefer)    Consulted and Agree with Plan of Care  Patient       Patient will benefit from skilled therapeutic intervention in order to improve the following deficits and impairments:  Abnormal gait, Decreased activity tolerance, Decreased balance, Decreased coordination, Decreased endurance, Decreased mobility, Decreased range of motion, Decreased strength, Difficulty walking, Postural dysfunction, Pain  Visit Diagnosis: Muscle weakness (generalized)  Repeated falls     Problem List Patient Active Problem List   Diagnosis Date Noted  . Malignant neoplasm of upper-outer quadrant of left breast in female, estrogen receptor positive ([place]) 03/09/2017  . Urinary hesitancy 01/27/2017  . Mild cognitive disorder 06/09/2016  . Genetic testing 12/17/2015  . Family  history of breast cancer   . Family history of ovarian cancer   . Family history of pancreatic cancer   . Insomnia 06/10/2015  . Urinary frequency 06/10/2015  . Frequent falls 12/26/2014  . Snoring 12/26/2014  . Depression 08/01/2014  . Other fatigue 08/01/2014  . Multiple sclerosis, primary progressive ([place]) 02/07/2014  . Spastic gait 02/07/2014  . Spastic diplegia ([place]) 02/07/2014  . Numbness 02/07/2014  . Foot drop, left 02/07/2014   Narda Bonds, PTA [place] 09/15/18 3:10 PM Phone: [phone] Fax: [facility] [address] [place], [place], [address] Phone: [phone]   Fax:  [phone]  Name: Taylor Hancock MRN: [id number] Date of Birth: [birth year]

## 2018-09-15 NOTE — Patient Instructions (Signed)
Sit-to-Stand Exercise  The sit-to-stand exercise (also known as the chair stand or chair rise exercise) strengthens your lower body and helps you maintain or improve your mobility and independence. The goal is to do the sit-to-stand exercise without using your hands. This will be easier as you become stronger. You should always talk with your health care provider before starting any exercise program, especially if you have had recent surgery. Do the exercise exactly as told by your health care provider and adjust it as directed. It is normal to feel mild stretching, pulling, tightness, or discomfort as you do this exercise, but you should stop right away if you feel sudden pain or your pain gets worse. Do not begin doing this exercise until told by your health care provider. What the sit-to-stand exercise does The sit-to-stand exercise helps to strengthen the muscles in your thighs and the muscles in the center of your body that give you stability (core muscles). This exercise is especially helpful if:  You have had knee or hip surgery.  You have trouble getting up from a chair, out of a car, or off the toilet. How to do the sit-to-stand exercise 1. Sit toward the front edge of a sturdy chair without armrests. Your knees should be bent and your feet should be flat on the floor and shoulder-width apart. 2. Place your hands lightly on each side of the seat. Keep your back and neck as straight as possible, with your chest slightly forward. 3. Breathe in slowly. Lean forward and slightly shift your weight to the front of your feet. 4. Breathe out as you slowly stand up. Use your hands as little as possible. 5. Stand and pause for a full breath in and out. 6. Breathe in as you sit down slowly. Tighten your core and abdominal muscles to control your lowering as much as possible. 7. Breathe out slowly. 8. Do this exercise 10-15 times. If needed, do it fewer times until you build up strength. 9. Rest for  1 minute, then do another set of 10-15 repetitions. To change the difficulty of the sit-to-stand exercise  If the exercise is too difficult, use a chair with sturdy armrests, and push off the armrests to help you come to the standing position. You can also use the armrests to help slowly lower yourself back to sitting. As this gets easier, try to use your arms less. You can also place a firm cushion or pillow on the chair to make the surface higher.  If this exercise is too easy, do not use your arms to help raise or lower yourself. You can also wear a weighted vest, use hand weights, increase your repetitions, or try a lower chair. General tips  You may feel tired when starting an exercise routine. This is normal.  You may have muscle soreness that lasts a few days. This is normal. As you get stronger, you may not feel muscle soreness.  Use smooth, steady movements.  Do not  hold your breath during strength exercises. This can cause unsafe changes in your blood pressure.  Breathe in slowly through your nose, and breathe out slowly through your mouth. Summary  Strengthening your lower body is an important step to help you move safely and independently.  The sit-to-stand exercise helps strengthen the muscles in your thighs and core.  You should always talk with your health care provider before starting any exercise program, especially if you have had recent surgery. This information is not intended to replace  advice given to you by your health care provider. Make sure you discuss any questions you have with your health care provider. Document Released: 02/27/2016 Document Revised: 11/03/2017 Document Reviewed: 02/27/2016 Elsevier Patient Education  [place].   Adduction: Hip - Knees Together (Sitting)    Sit with towel roll between knees. Push knees together. Hold for 5 seconds.  Repeat 10 times. Do 2 times a day.  Copyright  VHI. All rights reserved.   Bridging     Lie on back with feet shoulder width apart. Lift hips toward the ceiling. Hold for a count of 3. Repeat 10 times. Do 1 sessions per day.  http://gt2.exer.us/357   Copyright  VHI. All rights reserved.   Hip Abduction / Adduction: with Knee Flexion (Supine)    With both knees bent, gently lower knees to side and return. Repeat 10 times per set. Do 1 sessions per day.  http://orth.exer.us/683   Copyright  VHI. All rights reserved.

## 2018-09-16 ENCOUNTER — Telehealth: Payer: Self-pay | Admitting: Physical Therapy

## 2018-09-16 NOTE — Telephone Encounter (Signed)
Dr. Felecia Shelling,   I have seen your patient Taylor Hancock for PT at [facility]. Her power scooter is her primary means of mobility in her home and can not walk more than 15 ft with her rollator. Her current one is 60 years old and is having current issues with the batteries. She has a history of many falls and is at a very high risk for future falls. She was requesting one where the charger is on the tiller for safety reasons. She will need a prescription for a new power scooter and power mobility evaluation. If you agree, please place an order in [place] in Epic or fax the order to [phone].  Thank you, Janann August, PT, DPT 09/14/18 10:49 AM

## 2018-09-20 ENCOUNTER — Other Ambulatory Visit: Payer: Self-pay | Admitting: Neurology

## 2018-09-20 ENCOUNTER — Ambulatory Visit: Payer: 59 | Attending: Neurology | Admitting: Physical Therapy

## 2018-09-20 ENCOUNTER — Telehealth: Payer: Self-pay | Admitting: *Deleted

## 2018-09-20 ENCOUNTER — Other Ambulatory Visit: Payer: Self-pay

## 2018-09-20 DIAGNOSIS — R293 Abnormal posture: Secondary | ICD-10-CM | POA: Diagnosis present

## 2018-09-20 DIAGNOSIS — R2689 Other abnormalities of gait and mobility: Secondary | ICD-10-CM | POA: Diagnosis present

## 2018-09-20 DIAGNOSIS — G35 Multiple sclerosis: Secondary | ICD-10-CM

## 2018-09-20 DIAGNOSIS — G801 Spastic diplegic cerebral palsy: Secondary | ICD-10-CM

## 2018-09-20 DIAGNOSIS — R261 Paralytic gait: Secondary | ICD-10-CM

## 2018-09-20 DIAGNOSIS — R296 Repeated falls: Secondary | ICD-10-CM

## 2018-09-20 DIAGNOSIS — M6281 Muscle weakness (generalized): Secondary | ICD-10-CM

## 2018-09-20 NOTE — Telephone Encounter (Signed)
I placed the order and will send her a script

## 2018-09-20 NOTE — Progress Notes (Signed)
Power scooter evaluation

## 2018-09-20 NOTE — Telephone Encounter (Signed)
Faxed order for power scooter to [facility] at [phone]. Received fax confirmation. Also advised referral placed for power mobility evaluation.

## 2018-09-21 NOTE — Therapy (Signed)
[facility] [address] [place], [place], [address] Phone: [phone]   Fax:  [phone]  Physical Therapy Treatment  Patient Details  Name: Taylor Hancock MRN: [id number] Date of Birth: 1960/10/05 Referring Provider (PT): Britt Bottom, MD   Encounter Date: 09/20/2018  PT End of Session - 09/21/18 1121    Visit Number  4    Number of Visits  24    Date for PT Re-Evaluation  11/30/18    Authorization Type  UHC    PT Start Time  1618    PT Stop Time  1701    PT Time Calculation (min)  43 min    Equipment Utilized During Treatment  Gait belt   pt did not want gait belt used due to discomfort   Activity Tolerance  Patient tolerated treatment well    Behavior During Therapy  [facility] for tasks assessed/performed       Past Medical History:  Diagnosis Date  . Anxiety   . Complication of anesthesia    patient was awake during colonoscopy as drugs were not effective-reports that propofol is an effective analgesic for her  . Concussion   . Depression   . Family history of breast cancer   . Family history of ovarian cancer   . Family history of pancreatic cancer   . Fibromyalgia   . History of radiation therapy 05/20/17- 06/17/17   Left Breast/ 40.05 Gy in 15 fractions, Left Breast Boost/ 10 Gy in 5 fractions.   . Movement disorder   . MS (multiple sclerosis) ([place])    primary progressive MS  . Multiple sclerosis ([place])   . Neuropathy   . Osteoporosis   . Vision abnormalities     Past Surgical History:  Procedure Laterality Date  . BREAST LUMPECTOMY WITH RADIOACTIVE SEED AND SENTINEL LYMPH NODE BIOPSY Left 03/31/2017   Procedure: BREAST LUMPECTOMY WITH RADIOACTIVE SEED AND SENTINEL LYMPH NODE BIOPSY;  Surgeon: Stark Klein, MD;  Location: [facility];  Service: General;  Laterality: Left;  . FRACTURE SURGERY Left    hand  . I&D EXTREMITY  05/30/2011   Procedure: IRRIGATION AND DEBRIDEMENT EXTREMITY;  Surgeon: Tennis Must, MD;  Location[facility];  Service: Orthopedics;;  Incision and drainage of open Proximal interphalangeal joint with closed reduction of Proximal interphalangeal  joint left long finger  . KNEE ARTHROSCOPY      There were no vitals filed for this visit.  Subjective Assessment - 09/20/18 1622    Subjective  Felt good after last therapy session. Has not been able to do his bridging exercise at home due to not being able to be placed into position. No falls since the last time she was here.    Pertinent History  PMH: MS, fibromyalgia, CA with radiation therapy, neruopathy    How long can you stand comfortably?  no length of time if she does not have support    How long can you walk comfortably?  15 ft at most with rollator    Patient Stated Goals  walk better, not fall, get new power scooter                 09/20/18 0001  Transfers  Transfers Sit to Stand;Stand to Sit;Stand Pivot Transfers  Sit to Stand 4: Min guard;With upper extremity assist;From chair/3-in-1;Without upper extremity assist  Sit to Stand Details (indicate cue type and reason) Raised blue mat for higher surface to perform sit <> stands without  UE support. Cues for sequencing and shifting COG anteriorly. Manual facilitation to LLE from therapist in order to promote increased weight bearing   Stand to Sit 4: Min guard;Without upper extremity assist (cues for eccentric control)  Number of Reps Other reps (comment);Other sets (comment) (3 sets of 5)  Comments Stand step t/f from blue mat <> w/c - min A required to assist with keeping rollator close to body, pt with tendency to push too far anteriorly   Wheelchair Mobility  Comments Pt arrived in manual w/c - dependent for all w/c mobility from waiting room to therapy gym  Posture/Postural Control  Posture Comments When in standing without UE support, pt presents with increased R lean and trunk shortening on R and unable to keep head over her pelvis unless cued  High  Level Balance  High Level Balance Comments After each sit > stand, practiced static standing balance intermittently x1 minute bouts with no UE to single UE support (all with min guard), mirror in front for visual cueing for upright posture. Used mirror for visual cueing in order for pt to stand with head over pelvis. Needs cues to push through RUE on rollator to assist in shifting weight to L and for trunk elongation  Self-Care  Self-Care Other Self-Care Comments  Other Self-Care Comments  discussed osteoporosis precautions with pt - pt was unaware. Is currently forwardly flexing to put on her L shoe. Discussed possibly trialing a leg lifter to assist. Discussed with pt that order from power scooter has been put in and asked if pt had preference on vendor - pt unsure of vendor to use. Therapist also discussed time frame of when to expect power scooter. Discussed safety at home with rollator and keeping it close to body in order to help decrease risk of falls (pt with tendency to push it too far anteriorly).                   PT Education - 09/21/18 1121    Education Details  safety with rollator, sit to stand technique, asking pt what vendor she would prefer in order to schedule power mobility eval    Person(s) Educated  Patient    Methods  Explanation;Demonstration    Comprehension  Verbalized understanding;Need further instruction       PT Short Term Goals - 09/07/18 2146      PT SHORT TERM GOAL #1   Title  Pt will be I and compliant with HEP Target for all STG 6 weeks (10/18/28)    Status  New      PT SHORT TERM GOAL #2   Title  Pt will reduce 5TSTS by 5 sec to show improved balance and strength    Baseline  42    Status  New      PT SHORT TERM GOAL #3   Title  Pt will improve TUGby 3 seconds to show improved gait and balance.    Baseline  48    Status  New        PT Long Term Goals - 09/07/18 2148      PT LONG TERM GOAL #1   Title  Pt will be I and compliant  with final HEP and have regular exercise program outside of PT. (target for all LTG's 12 weeks 11/30/18)    Status  New      PT LONG TERM GOAL #2   Title  she will improve 5TSTS test by 8 sec to show improved  balance.    Baseline  42      PT LONG TERM GOAL #3   Title  She will improve TUG by at least 8 sec to show improved balance and gait    Baseline  48      PT LONG TERM GOAL #4   Title  Pt will report no more than one fall per month.    Baseline  1-2 falls per week    Status  New            Plan - 09/21/18 1122    Clinical Impression Statement  Skilled session focused on standing balance with no UE support and use of mirror for visual feedback - pt with tendency to lean too far R. Can correct initially with manual and verbal cueing and using RUE extended on rollator to elongate trunk and shift weight to L. Pt needs frequent cueing throughout transfers in session to keep rollator close to body and education for home use in order to decrease risk of future falls. Pt needs frequent re-direction to the task at hand during therapy, has tendency to be talkative about previous PT sessions and other medical impairments. Will continue to progress towards LTGs.    Personal Factors and Comorbidities  Comorbidity 1;Comorbidity 2;Comorbidity 3+    Comorbidities  PMH: MS, fibromyalgia, CA with radiation therapy, neruopathy    Examination-Activity Limitations  --   any activity   Examination-Participation Restrictions  --   any physcial participation   Stability/Clinical Decision Making  Unstable/Unpredictable    Rehab Potential  Fair    PT Frequency  2x / week    PT Duration  12 weeks    PT Treatment/Interventions  ADLs/Self Care Home Management;Aquatic Therapy;Cryotherapy;Gait training;Stair training;Functional mobility training;Therapeutic activities;Therapeutic exercise;Balance training;Neuromuscular re-education;Manual techniques;Passive range of motion;Taping    PT Next Visit Plan  Ask  what vendor ([employer] or [employer]) that pt would prefer for power mobility evaluation, and that she might not be able to be scheduled for one until October. sit <> stands for LE strength, address postural deficits in standing/sitting with mirror for visual feedback (pt tends to lean too far R), modified side planks or planks for core stability, seated T-band rows. Possible leg lifter trial to put on shoes on LLE due to osteoporosis precautions.    Consulted and Agree with Plan of Care  Patient       Patient will benefit from skilled therapeutic intervention in order to improve the following deficits and impairments:  Abnormal gait, Decreased activity tolerance, Decreased balance, Decreased coordination, Decreased endurance, Decreased mobility, Decreased range of motion, Decreased strength, Difficulty walking, Postural dysfunction, Pain  Visit Diagnosis: Muscle weakness (generalized)  Repeated falls  Other abnormalities of gait and mobility  Abnormal posture     Problem List Patient Active Problem List   Diagnosis Date Noted  . Malignant neoplasm of upper-outer quadrant of left breast in female, estrogen receptor positive ([place]) 03/09/2017  . Urinary hesitancy 01/27/2017  . Mild cognitive disorder 06/09/2016  . Genetic testing 12/17/2015  . Family history of breast cancer   . Family history of ovarian cancer   . Family history of pancreatic cancer   . Insomnia 06/10/2015  . Urinary frequency 06/10/2015  . Frequent falls 12/26/2014  . Snoring 12/26/2014  . Depression 08/01/2014  . Other fatigue 08/01/2014  . Multiple sclerosis, primary progressive ([place]) 02/07/2014  . Spastic gait 02/07/2014  . Spastic diplegia ([place]) 02/07/2014  . Numbness 02/07/2014  . Foot drop, left 02/07/2014  Arliss Journey, PT, DPT 09/21/2018, 11:30 AM  [facility] [address] [place], [place], [address] Phone: [phone]   Fax:   [phone]  Name: Taylor Hancock MRN: [id number] Date of Birth: 04/06/1960

## 2018-09-22 ENCOUNTER — Ambulatory Visit: Payer: 59 | Admitting: Rehabilitative and Restorative Service Providers"

## 2018-09-29 ENCOUNTER — Telehealth: Payer: Self-pay | Admitting: Neurology

## 2018-09-29 DIAGNOSIS — G35 Multiple sclerosis: Secondary | ICD-10-CM

## 2018-09-29 NOTE — Telephone Encounter (Signed)
Pt would like to know if someone can come to her home to do the therapy there. Please advise.

## 2018-09-29 NOTE — Telephone Encounter (Signed)
I will put in an order for home health physical therapy.

## 2018-09-29 NOTE — Telephone Encounter (Signed)
Dr. Jannifer Franklin- are you okay with placing home health referral for PT? Dr. Felecia Shelling out of the office. Pt has primary progressive MS.

## 2018-10-04 ENCOUNTER — Other Ambulatory Visit: Payer: Self-pay

## 2018-10-04 ENCOUNTER — Ambulatory Visit: Payer: 59 | Admitting: Physical Therapy

## 2018-10-04 DIAGNOSIS — R296 Repeated falls: Secondary | ICD-10-CM

## 2018-10-04 DIAGNOSIS — R293 Abnormal posture: Secondary | ICD-10-CM

## 2018-10-04 DIAGNOSIS — R2689 Other abnormalities of gait and mobility: Secondary | ICD-10-CM

## 2018-10-04 DIAGNOSIS — M6281 Muscle weakness (generalized): Secondary | ICD-10-CM | POA: Diagnosis not present

## 2018-10-04 NOTE — Telephone Encounter (Signed)
Hinton Dyer- I printed and had Dr. Felecia Shelling addend referral, can you please re-send via fax? I included order for power scooter/tiller

## 2018-10-04 NOTE — Telephone Encounter (Addendum)
Called patient back. Chloe from neuro[facility]rehab next to our office called pt this am to schedule PT but pt wants to proceed with home PT.  Pt aware Dr. Jannifer Franklin place home health PT referral last week since Dr. Felecia Shelling was off. She has not been contacted to get scheduled yet for home health. Advised I will have Hinton Dyer Cox follow up on referral to see what is going on. She verbalized understanding. She would like to be called back at [phone].

## 2018-10-04 NOTE — Therapy (Signed)
[facility] [address] [place], [place], [address] Phone: [phone]   Fax:  [phone]  Physical Therapy Treatment  Patient Details  Name: Taylor Hancock MRN: [id number] Date of Birth: 05/22/1960 Referring Provider (PT): Britt Bottom, MD   Encounter Date: 10/04/2018  PT End of Session - 10/04/18 1410    Visit Number  5    Number of Visits  24    Date for PT Re-Evaluation  11/30/18    Authorization Type  [employer]    PT Start Time  1102    PT Stop Time  1146    PT Time Calculation (min)  44 min    Equipment Utilized During Treatment  Gait belt   pt did not want gait belt used due to discomfort   Activity Tolerance  Patient tolerated treatment well    Behavior During Therapy  [facility] for tasks assessed/performed       Past Medical History:  Diagnosis Date  . Anxiety   . Complication of anesthesia    patient was awake during colonoscopy as drugs were not effective-reports that propofol is an effective analgesic for her  . Concussion   . Depression   . Family history of breast cancer   . Family history of ovarian cancer   . Family history of pancreatic cancer   . Fibromyalgia   . History of radiation therapy 05/20/17- 06/17/17   Left Breast/ 40.05 Gy in 15 fractions, Left Breast Boost/ 10 Gy in 5 fractions.   . Movement disorder   . MS (multiple sclerosis) ([place])    primary progressive MS  . Multiple sclerosis ([place])   . Neuropathy   . Osteoporosis   . Vision abnormalities     Past Surgical History:  Procedure Laterality Date  . BREAST LUMPECTOMY WITH RADIOACTIVE SEED AND SENTINEL LYMPH NODE BIOPSY Left 03/31/2017   Procedure: BREAST LUMPECTOMY WITH RADIOACTIVE SEED AND SENTINEL LYMPH NODE BIOPSY;  Surgeon: Stark Klein, MD;  Location: [facility];  Service: General;  Laterality: Left;  . FRACTURE SURGERY Left    hand  . I&D EXTREMITY  05/30/2011   Procedure: IRRIGATION AND DEBRIDEMENT EXTREMITY;  Surgeon: Tennis Must, MD;  Location: [facility];  Service: Orthopedics;;  Incision and drainage of open Proximal interphalangeal joint with closed reduction of Proximal interphalangeal  joint left long finger  . KNEE ARTHROSCOPY      There were no vitals filed for this visit.  Subjective Assessment - 10/04/18 1108    Subjective  Has 2 falls last week - one was walking outside and had a threshold coming in, the other one was also coming in from outside to inside. Was trying to go from a higher chair to her scooter - without her rollator.  Got up from crawling on the floor.  Has had a referral put in for home health PT - will be starting later week (pt still has not been scheduled). Discussed with pt about having to discontinue OPPT if she is receiving home health.    Pertinent History  PMH: MS, fibromyalgia, CA with radiation therapy, neruopathy    How long can you stand comfortably?  no length of time if she does not have support    How long can you walk comfortably?  15 ft at most with rollator    Patient Stated Goals  walk better, not fall, get new power scooter  [facility] PT Treatment/Exercise - 10/04/18 0001      Transfers   Transfers  Sit to Stand;Stand to Sit    Sit to Stand  4: Min guard;With upper extremity assist;From chair/3-in-1    Sit to Stand Details (indicate cue type and reason)  From low blue mat table, educated pt to perform at home with single UE support from mat table to stand vs. no UE support to increase safety at home. Pt attempted no UE support from low mat table to stand with rollator, took increased time and needed min A to help to stand.     Stand to Sit  4: Min guard;Without upper extremity assist   cues for eccentric control    Comments  Stand step t/f from w/c to and from blue mat table with min guard/min A for rollator advancement - pt with tendency to push rollator too far anteriorly. Cueing for pt to lock rollator before performing sit <>  stand and weight shifting to take steps.       Ambulation/Gait   Ambulation/Gait  Yes    Ambulation/Gait Assistance  4: Min assist;3: Mod assist    Ambulation/Gait Assistance Details  Pt with very slow gait speed. Min/mod A for rollator management - pt with tendency to push rollator too far anteriorly. W/c follow for safety and for when pt fatigues.     Ambulation Distance (Feet)  54 Feet   2 x 27 feet   Assistive device  Other (Comment)   Rollator   Gait Pattern  Decreased step length - right;Decreased step length - left;Decreased hip/knee flexion - right;Decreased hip/knee flexion - left;Decreased dorsiflexion - right;Decreased dorsiflexion - left;Ataxic;Poor foot clearance - left;Wide base of support;Trunk flexed    Ambulation Surface  Level;Indoor      Wheelchair Mobility   Comments  Pt arrived in manual w/c - dependent for all w/c mobility from waiting room to therapy gym      Self-Care   Self-Care  Other Self-Care Comments    Other Self-Care Comments   Pt called last week to [facility] to speak to Dr. Felecia Shelling and asked for a home health physical therapy referral. Referral has been placed but pt has not been scheduled/seen for home health PT. Therapist discussed with pt that she can't receive OPPT and home health therapy at the same time and that all future appointments will need to be canceled - pt verbalized understanding and in agreement. Pt asked therapist what she thought about pt receiving home health, with therapist stating that pt would benefit from home health to help prevent falls at home and assist pt with transfers/gait in her home environment. Therapist suggesting that pt should follow up with nurse at [facility] to determine follow up with referral. Discussed with pt fall prevention strategies at home d/t recent falls - performing all stand step transfers with rollator to her scooter (pt's fall has been not using her rollator to transfer) and making sure rollator is close to her body when  ambulating/performing transfers to decrease risk of falls.              PT Education - 10/04/18 1410    Education Details  see self care, fall prevention at home when using rollator (keeping rollator close by, locking before standing, using for stand step transfers).    Person(s) Educated  Patient    Methods  Explanation;Demonstration    Comprehension  Verbalized understanding;Returned demonstration       PT Short Term Goals -  09/07/18 2146      PT SHORT TERM GOAL #1   Title  Pt will be I and compliant with HEP Target for all STG 6 weeks (10/18/28)    Status  New      PT SHORT TERM GOAL #2   Title  Pt will reduce 5TSTS by 5 sec to show improved balance and strength    Baseline  42    Status  New      PT SHORT TERM GOAL #3   Title  Pt will improve TUGby 3 seconds to show improved gait and balance.    Baseline  48    Status  New        PT Long Term Goals - 09/07/18 2148      PT LONG TERM GOAL #1   Title  Pt will be I and compliant with final HEP and have regular exercise program outside of PT. (target for all LTG's 12 weeks 11/30/18)    Status  New      PT LONG TERM GOAL #2   Title  she will improve 5TSTS test by 8 sec to show improved balance.    Baseline  42      PT LONG TERM GOAL #3   Title  She will improve TUG by at least 8 sec to show improved balance and gait    Baseline  48      PT LONG TERM GOAL #4   Title  Pt will report no more than one fall per month.    Baseline  1-2 falls per week    Status  New            Plan - 10/04/18 1431    Clinical Impression Statement  At beginning of session discussed with pt needing to cancel future PT appointments at this clinic if pt is to start home health PT (asked for a referral at Dr. Garth Bigness office last week and referral has been placed). Focus of today's session including gait training with rollator management, and transfers with rollator. Pt needing min-mod A for rollator advancement during gait - pt with  tendency to push rollator too far anteriorly. W/c follow due to pt reporting LE fatigue and needing a seated rest break. Pt has additional appointment scheduled this week - pt reported she will call to cancel appointment if she gets home health scheduled for this week.    Personal Factors and Comorbidities  Comorbidity 1;Comorbidity 2;Comorbidity 3+    Comorbidities  PMH: MS, fibromyalgia, CA with radiation therapy, neruopathy    Examination-Activity Limitations  --   any activity   Examination-Participation Restrictions  --   any physcial participation   Stability/Clinical Decision Making  Unstable/Unpredictable    Rehab Potential  Fair    PT Frequency  2x / week    PT Duration  12 weeks    PT Treatment/Interventions  ADLs/Self Care Home Management;Aquatic Therapy;Cryotherapy;Gait training;Stair training;Functional mobility training;Therapeutic activities;Therapeutic exercise;Balance training;Neuromuscular re-education;Manual techniques;Passive range of motion;Taping    PT Next Visit Plan  ? D/C if pt starts with home health. Gait with rollator, stand step transfers. sit <> stands for LE strength, address postural deficits in standing/sitting with mirror for visual feedback (pt tends to lean too far R), modified side planks or planks for core stability, seated T-band rows.    Consulted and Agree with Plan of Care  Patient       Patient will benefit from skilled therapeutic intervention in order to improve the following  deficits and impairments:  Abnormal gait, Decreased activity tolerance, Decreased balance, Decreased coordination, Decreased endurance, Decreased mobility, Decreased range of motion, Decreased strength, Difficulty walking, Postural dysfunction, Pain  Visit Diagnosis: Muscle weakness (generalized)  Repeated falls  Other abnormalities of gait and mobility  Abnormal posture     Problem List Patient Active Problem List   Diagnosis Date Noted  . Malignant neoplasm of  upper-outer quadrant of left breast in female, estrogen receptor positive ([place]) 03/09/2017  . Urinary hesitancy 01/27/2017  . Mild cognitive disorder 06/09/2016  . Genetic testing 12/17/2015  . Family history of breast cancer   . Family history of ovarian cancer   . Family history of pancreatic cancer   . Insomnia 06/10/2015  . Urinary frequency 06/10/2015  . Frequent falls 12/26/2014  . Snoring 12/26/2014  . Depression 08/01/2014  . Other fatigue 08/01/2014  . Multiple sclerosis, primary progressive ([place]) 02/07/2014  . Spastic gait 02/07/2014  . Spastic diplegia ([place]) 02/07/2014  . Numbness 02/07/2014  . Foot drop, left 02/07/2014    Arliss Journey, PT, DPT 10/04/2018, 2:35 PM  [facility] [address] [place], [place], [address] Phone: [phone]   Fax:  [phone]  Name: Taylor Hancock MRN: [id number] Date of Birth: 09/15/1960

## 2018-10-04 NOTE — Telephone Encounter (Signed)
Pt called wanting to speak to RN about her PT appt. Please advise.

## 2018-10-04 NOTE — Telephone Encounter (Signed)
I have sent order as Urgent to [facility] to see if patient can be started this week.  Patient is asking if order has been sent for her Power scooter with charger tiller ?  Patient is also asking for orders for Occupational therapy and a Aide ?

## 2018-10-05 NOTE — Telephone Encounter (Signed)
[facility] came back and relayed that they could not take patient . I have sent to [facility] now to see if they can take patient .

## 2018-10-05 NOTE — Telephone Encounter (Signed)
[facility] can't take patient's insurance I have reached out to [facility].

## 2018-10-05 NOTE — Telephone Encounter (Signed)
[facility] can't take the patient . I will try another place .

## 2018-10-06 ENCOUNTER — Ambulatory Visit: Payer: 59 | Admitting: Physical Therapy

## 2018-10-06 NOTE — Telephone Encounter (Signed)
[facility] has accepted patient . Dr.Sater's written order has been faxed .

## 2018-10-06 NOTE — Telephone Encounter (Signed)
Dr. Garth Bigness written order was for OT. Home Health and and Power scooter with Secretary/administrator

## 2018-10-10 ENCOUNTER — Telehealth: Payer: Self-pay | Admitting: Neurology

## 2018-10-10 NOTE — Telephone Encounter (Signed)
error 

## 2018-10-11 ENCOUNTER — Ambulatory Visit: Payer: 59 | Admitting: Physical Therapy

## 2018-10-13 ENCOUNTER — Ambulatory Visit: Payer: 59 | Admitting: Physical Therapy

## 2018-10-17 ENCOUNTER — Telehealth: Payer: Self-pay | Admitting: Neurology

## 2018-10-17 NOTE — Telephone Encounter (Signed)
PT Deneise Lever with [facility] called to report a fall pt told her about that took place at 9pm last night.  Pt's left leg was under her.  Pt reports that when she puts wight on the leg it hurts.  PT Deneise Lever said she advised pt to go to the Dr but pt has not agreed to go.  This is a FYI, PT Deneise Lever has not asked for a call back but can be reached at [phone]

## 2018-10-17 NOTE — Telephone Encounter (Signed)
Dr. Sater- FYI 

## 2018-10-18 ENCOUNTER — Telehealth: Payer: Self-pay | Admitting: Neurology

## 2018-10-18 ENCOUNTER — Ambulatory Visit: Payer: 59 | Admitting: Physical Therapy

## 2018-10-18 NOTE — Telephone Encounter (Signed)
Called patient back. She fell Sunday (10/16/2018). She was in the bathroom and her left leg (weak side) gave out on her. She had to crawl to her bed to get herself up. Denies hitting her head or significant injuries. However, states she is unable to walk. She is feeling some better since Sunday. She is in some pain after fall from her toes up to her knee on left side. She has noticed some swelling. When home PT was out they wererecommending she go to urgent care/ED to get x-rayed but she declined stating she cannot get out to go. I explained she may need to call ambulance if pain severe and be transferred to get imaging. Pt wanting to know Dr. Garth Bigness recommendation first. Pt is wondering if she should go to nursing home. Advised I will send message to Dr. Felecia Shelling and call back with recommendation.

## 2018-10-18 NOTE — Telephone Encounter (Signed)
Pt called stating she had a real bad fall Sunday night and she can barely walk and would like to speak to RN. Please advise.

## 2018-10-18 NOTE — Telephone Encounter (Signed)
If still having pain, she should go to UC or ED for x-ray

## 2018-10-18 NOTE — Telephone Encounter (Signed)
Called, LVM for pt letting her know she should proceed to either UC or ED for further evaluation for imaging

## 2018-10-19 ENCOUNTER — Emergency Department (HOSPITAL_BASED_OUTPATIENT_CLINIC_OR_DEPARTMENT_OTHER): Payer: 59

## 2018-10-19 ENCOUNTER — Other Ambulatory Visit: Payer: Self-pay

## 2018-10-19 ENCOUNTER — Encounter (HOSPITAL_BASED_OUTPATIENT_CLINIC_OR_DEPARTMENT_OTHER): Payer: Self-pay | Admitting: Emergency Medicine

## 2018-10-19 ENCOUNTER — Emergency Department (HOSPITAL_BASED_OUTPATIENT_CLINIC_OR_DEPARTMENT_OTHER)
Admission: EM | Admit: 2018-10-19 | Discharge: 2018-10-19 | Disposition: A | Discharge status: Home / Self Care | Payer: 59 | Attending: Emergency Medicine | Admitting: Emergency Medicine

## 2018-10-19 DIAGNOSIS — Z79899 Other long term (current) drug therapy: Secondary | ICD-10-CM | POA: Diagnosis not present

## 2018-10-19 DIAGNOSIS — Y999 Unspecified external cause status: Secondary | ICD-10-CM | POA: Diagnosis not present

## 2018-10-19 DIAGNOSIS — R2242 Localized swelling, mass and lump, left lower limb: Secondary | ICD-10-CM | POA: Diagnosis not present

## 2018-10-19 DIAGNOSIS — S92345A Nondisplaced fracture of fourth metatarsal bone, left foot, initial encounter for closed fracture: Secondary | ICD-10-CM | POA: Insufficient documentation

## 2018-10-19 DIAGNOSIS — S92355A Nondisplaced fracture of fifth metatarsal bone, left foot, initial encounter for closed fracture: Secondary | ICD-10-CM | POA: Insufficient documentation

## 2018-10-19 DIAGNOSIS — Y939 Activity, unspecified: Secondary | ICD-10-CM | POA: Diagnosis not present

## 2018-10-19 DIAGNOSIS — S82832A Other fracture of upper and lower end of left fibula, initial encounter for closed fracture: Secondary | ICD-10-CM | POA: Diagnosis not present

## 2018-10-19 DIAGNOSIS — Z87891 Personal history of nicotine dependence: Secondary | ICD-10-CM | POA: Diagnosis not present

## 2018-10-19 DIAGNOSIS — Z91040 Latex allergy status: Secondary | ICD-10-CM | POA: Insufficient documentation

## 2018-10-19 DIAGNOSIS — S8992XA Unspecified injury of left lower leg, initial encounter: Secondary | ICD-10-CM | POA: Diagnosis present

## 2018-10-19 DIAGNOSIS — G35 Multiple sclerosis: Secondary | ICD-10-CM | POA: Insufficient documentation

## 2018-10-19 DIAGNOSIS — S92515A Nondisplaced fracture of proximal phalanx of left lesser toe(s), initial encounter for closed fracture: Secondary | ICD-10-CM | POA: Diagnosis not present

## 2018-10-19 DIAGNOSIS — S92909A Unspecified fracture of unspecified foot, initial encounter for closed fracture: Secondary | ICD-10-CM

## 2018-10-19 DIAGNOSIS — M25552 Pain in left hip: Secondary | ICD-10-CM

## 2018-10-19 DIAGNOSIS — W19XXXA Unspecified fall, initial encounter: Secondary | ICD-10-CM | POA: Diagnosis not present

## 2018-10-19 DIAGNOSIS — Y92002 Bathroom of unspecified non-institutional (private) residence single-family (private) house as the place of occurrence of the external cause: Secondary | ICD-10-CM | POA: Diagnosis not present

## 2018-10-19 MED ORDER — OXYCODONE-ACETAMINOPHEN 5-325 MG PO TABS
1.0000 | ORAL_TABLET | Freq: Once | ORAL | Status: AC
  Administered 2018-10-19: 08:00:00 1 via ORAL
  Filled 2018-10-19: qty 1

## 2018-10-19 MED ORDER — OXYCODONE-ACETAMINOPHEN 5-325 MG PO TABS: 1.0000 | ORAL_TABLET | ORAL | 0 refills | Status: DC | PRN

## 2018-10-19 NOTE — Telephone Encounter (Signed)
Pt called in and stated that she did make it to the ED and she does have some fractures in her left foot.

## 2018-10-19 NOTE — Telephone Encounter (Signed)
Tried calling pt again, went straight to VM. LVM for pt to call office to let us know she received message from yesterday

## 2018-10-19 NOTE — ED Provider Notes (Signed)
[facility] EMERGENCY DEPARTMENT Provider Note   CSN: [id number] Arrival date & time: 10/19/18  0810     History   Chief Complaint Chief Complaint  Patient presents with   Knee Injury    HPI Taylor Hancock is a 58 y.o. female.     The history is provided by the patient, the EMS personnel and medical records. No language interpreter was used.  Fall This is a recurrent problem. The current episode started more than 2 days ago. The problem occurs every several days. The problem has not changed since onset.Pertinent negatives include no chest pain, no abdominal pain, no headaches and no shortness of breath. Nothing aggravates the symptoms. Nothing relieves the symptoms. She has tried nothing for the symptoms. The treatment provided no relief.    Past Medical History:  Diagnosis Date   Anxiety    Complication of anesthesia    patient was awake during colonoscopy as drugs were not effective-reports that propofol is an effective analgesic for her   Concussion    Depression    Family history of breast cancer    Family history of ovarian cancer    Family history of pancreatic cancer    Fibromyalgia    History of radiation therapy 05/20/17- 06/17/17   Left Breast/ 40.05 Gy in 15 fractions, Left Breast Boost/ 10 Gy in 5 fractions.    Movement disorder    MS (multiple sclerosis) ([place])    primary progressive MS   Multiple sclerosis ([place])    Neuropathy    Osteoporosis    Vision abnormalities     Patient Active Problem List   Diagnosis Date Noted   Malignant neoplasm of upper-outer quadrant of left breast in female, estrogen receptor positive ([place]) 03/09/2017   Urinary hesitancy 01/27/2017   Mild cognitive disorder 06/09/2016   Genetic testing 12/17/2015   Family history of breast cancer    Family history of ovarian cancer    Family history of pancreatic cancer    Insomnia 06/10/2015   Urinary frequency 06/10/2015   Frequent falls  12/26/2014   Snoring 12/26/2014   Depression 08/01/2014   Other fatigue 08/01/2014   Multiple sclerosis, primary progressive ([place]) 02/07/2014   Spastic gait 02/07/2014   Spastic diplegia ([place]) 02/07/2014   Numbness 02/07/2014   Foot drop, left 02/07/2014    Past Surgical History:  Procedure Laterality Date   BREAST LUMPECTOMY WITH RADIOACTIVE SEED AND SENTINEL LYMPH NODE BIOPSY Left 03/31/2017   Procedure: BREAST LUMPECTOMY WITH RADIOACTIVE SEED AND SENTINEL LYMPH NODE BIOPSY;  Surgeon: Stark Klein, MD;  Location: [facility];  Service: General;  Laterality: Left;   FRACTURE SURGERY Left    hand   I&D EXTREMITY  05/30/2011   Procedure: IRRIGATION AND DEBRIDEMENT EXTREMITY;  Surgeon: Tennis Must, MD;  Location: [facility];  Service: Orthopedics;;  Incision and drainage of open Proximal interphalangeal joint with closed reduction of Proximal interphalangeal  joint left long finger   KNEE ARTHROSCOPY       OB History   No obstetric history on file.      Home Medications    Prior to Admission medications   Medication Sig Start Date End Date Taking? Authorizing Provider  calcium citrate-vitamin D (CITRACAL+D) 315-200 MG-UNIT tablet Take 1 tablet by mouth 2 (two) times daily.    [provider]  cholecalciferol (VITAMIN D) 1000 units tablet Take 1,000 Units by mouth daily. Pt reports 2000 UNITS daily, somedays up to 8000 UNITS.    [provider]  clonazePAM (KLONOPIN) 1 MG tablet TAKE 4 TABLETS BY MOUTH EVERY DAY AT BEDTIME 06/07/18   Sater, Nanine Means, MD  cyclobenzaprine (FLEXERIL) 5 MG tablet TAKE 1 TO 2 TABLETS BY MOUTH EVERY DAY AT BEDTIME 09/14/18   Sater, Nanine Means, MD  hydrochlorothiazide (HYDRODIURIL) 12.5 MG tablet TAKE 1 TABLET BY MOUTH EVERY DAY IN THE MORNING 10/05/17   [provider]  Omega-3 Fatty Acids (FISH OIL) 1000 MG CAPS Take by mouth.    [provider]  pantoprazole (PROTONIX) 40 MG tablet Take 40 mg by mouth daily.     [provider]  solifenacin (VESICARE) 5 MG tablet Take 1 tablet (5 mg total) by mouth daily. 08/11/18   Sater, Nanine Means, MD  tiZANidine (ZANAFLEX) 4 MG tablet TAKE 2 TABLETS (8 MG TOTAL) BY MOUTH AT BEDTIME. 08/01/18   Sater, Nanine Means, MD    Family History Family History  Problem Relation Age of Onset   Hypertension Mother    Breast cancer Mother 18   Hypothyroidism Mother    Hyperlipidemia Father    Diabetes Father    Pancreatic cancer Father 34   Hypothyroidism Sister    Ovarian cancer Maternal Aunt        dx in her 65s   Parkinson's disease Maternal Uncle    Stroke Maternal Uncle    Breast cancer Maternal Grandmother 100   Breast cancer Maternal Aunt        dx under 78   Breast cancer Maternal Aunt        dx over 76s   Breast cancer Maternal Aunt        dx >50   Breast cancer Other    Hypothyroidism Brother    Breast cancer Cousin    Breast cancer Sister     Social History Social History   Tobacco Use   Smoking status: Former Smoker    Packs/day: 0.50    Types: Cigarettes    Quit date: 11/25/2012    Years since quitting: 5.9   Smokeless tobacco: Never Used  Substance Use Topics   Alcohol use: Yes    Comment: 12 alcohol drinks weekly   Drug use: No     Allergies   Letrozole, Levaquin [levofloxacin in d5w], Ppa-mma express [compleat], Zithromax [azithromycin], Latex, and Ultram [tramadol hcl]   Review of Systems Review of Systems  Constitutional: Negative for activity change, chills, diaphoresis, fatigue and fever.  HENT: Negative for congestion and rhinorrhea.   Eyes: Negative for visual disturbance.  Respiratory: Negative for cough, chest tightness, shortness of breath and stridor.   Cardiovascular: Positive for leg swelling. Negative for chest pain and palpitations.  Gastrointestinal: Negative for abdominal distention, abdominal pain, constipation, diarrhea, nausea and vomiting.  Genitourinary: Negative for difficulty  urinating, dysuria, flank pain, frequency, hematuria, menstrual problem, pelvic pain, vaginal bleeding and vaginal discharge.  Musculoskeletal: Positive for gait problem (leg pain prevents walking). Negative for back pain and neck pain.  Skin: Negative for rash and wound.  Neurological: Negative for dizziness, weakness, light-headedness, numbness and headaches.  Psychiatric/Behavioral: Negative for agitation and confusion.  All other systems reviewed and are negative.    Physical Exam Updated Vital Signs BP 126/83 (BP Location: Right Arm)    Pulse 95    Temp 98 F (36.7 C) (Oral)    Resp 18    SpO2 100%   Physical Exam Musculoskeletal:     Left hip: She exhibits tenderness.     Left knee: Tenderness found.  Left ankle: Tenderness.     Left lower leg: Edema present.       Legs:     Left foot: Tenderness and swelling present.     Comments: Tenderness in left hip, left knee, left ankle, left foot.  Swelling present.  No erythema or rashes seen.  Good pulses.  Good sensation.  Weakness is at her baseline on left leg by report.      ED Treatments / Results  Labs (all labs ordered are listed, but only abnormal results are displayed) Labs Reviewed - No data to display  EKG None  Radiology Dg Ankle Complete Left  Result Date: 10/19/2018 CLINICAL DATA:  Pain following recent fall EXAM: LEFT ANKLE COMPLETE - 3+ VIEW COMPARISON:  None. FINDINGS: Frontal, oblique, and lateral views were obtained. There is generalized soft tissue swelling. There is a focal avulsion in the medial malleolar region. On the lateral view, there is a lucency suggesting nondisplaced fracture of the posterior distal fibula. This apparent fracture is not confirmed on other views. There is evidence of an old avulsion along the dorsal distal talus. This area is well corticated and does not appear acute. No appreciable joint space narrowing or erosion. Ankle mortise appears intact. IMPRESSION: Soft tissue swelling.  Avulsion medial malleolus. Suspected nondisplaced fracture along the posterior distal fibula, seen only on lateral view. Evidence of old trauma involving the dorsal distal talus. Ankle mortise appears grossly intact. Electronically Signed   By: Lowella Grip III M.D.   On: 10/19/2018 09:16   US Venous Img Lower Unilateral Left  Result Date: 10/19/2018 CLINICAL DATA:  Left lower extremity pain and edema for the past several days. Recent fall. History of breast cancer. Evaluate for DVT. EXAM: LEFT LOWER EXTREMITY VENOUS DOPPLER ULTRASOUND TECHNIQUE: Gray-scale sonography with graded compression, as well as color Doppler and duplex ultrasound were performed to evaluate the lower extremity deep venous systems from the level of the common femoral vein and including the common femoral, femoral, profunda femoral, popliteal and calf veins including the posterior tibial, peroneal and gastrocnemius veins when visible. The superficial great saphenous vein was also interrogated. Spectral Doppler was utilized to evaluate flow at rest and with distal augmentation maneuvers in the common femoral, femoral and popliteal veins. COMPARISON:  None. FINDINGS: Contralateral Common Femoral Vein: Respiratory phasicity is normal and symmetric with the symptomatic side. No evidence of thrombus. Normal compressibility. Common Femoral Vein: No evidence of thrombus. Normal compressibility, respiratory phasicity and response to augmentation. Saphenofemoral Junction: No evidence of thrombus. Normal compressibility and flow on color Doppler imaging. Profunda Femoral Vein: No evidence of thrombus. Normal compressibility and flow on color Doppler imaging. Femoral Vein: No evidence of thrombus. Normal compressibility, respiratory phasicity and response to augmentation. Popliteal Vein: No evidence of thrombus. Normal compressibility, respiratory phasicity and response to augmentation. Calf Veins: No evidence of thrombus. Normal compressibility  and flow on color Doppler imaging. Superficial Great Saphenous Vein: No evidence of thrombus. Normal compressibility. Venous Reflux:  None. Other Findings: Note is made of a tiny (approximately 4.6 x 1.8 x 1.2 cm fluid collection about the left knee potentially representative of a joint effusion. There is a minimal subcutaneous edema at the level the left calf and ankle. IMPRESSION: 1. No evidence of DVT within the left lower extremity. 2. Potential small left knee joint effusion. Electronically Signed   By: Sandi Mariscal M.D.   On: 10/19/2018 09:48   Dg Knee Complete 4 Views Left  Result Date: 10/19/2018 CLINICAL DATA:  Lower extremity pain and edema EXAM: LEFT KNEE - COMPLETE 4+ VIEW COMPARISON:  None. FINDINGS: Frontal, lateral, and bilateral oblique views were obtained. No fracture or dislocation. No joint effusion. There is joint space narrowing medially and in the patellofemoral joint region. No erosive change. IMPRESSION: Joint space narrowing medially and in the patellofemoral joints. No evident fracture or dislocation. No appreciable joint effusion. Electronically Signed   By: Lowella Grip III M.D.   On: 10/19/2018 09:14   Dg Foot Complete Left  Result Date: 10/19/2018 CLINICAL DATA:  Pain following fall EXAM: LEFT FOOT - COMPLETE 3+ VIEW COMPARISON:  None. FINDINGS: Frontal, oblique, and lateral views were obtained. There is a fracture of the proximal aspect of the fifth proximal phalanx with alignment near anatomic. There are subtle impaction injuries in the distal fourth and fifth metacarpals with alignment in these areas essentially anatomic. No other fractures are evident in the foot region. Apparent fracture of the posterior distal fibula seen on lateral view. There is soft tissue swelling. No dislocations. Joint spaces appear unremarkable. No erosive change. IMPRESSION: Fractures of the distal aspects of the fourth and fifth metatarsals. Fracture proximal aspect of fifth proximal phalanx.  Apparent fracture posterior fibula seen only on lateral view. Soft tissue swelling. No dislocation. No appreciable arthropathy. Electronically Signed   By: Lowella Grip III M.D.   On: 10/19/2018 09:20   Dg Hip Unilat With Pelvis 2-3 Views Left  Result Date: 10/19/2018 CLINICAL DATA:  Pain following recent fall EXAM: DG HIP (WITH OR WITHOUT PELVIS) 2-3V LEFT COMPARISON:  None. FINDINGS: Frontal pelvis as well as frontal and lateral left hip images were obtained. There is no evident fracture or dislocation. There is mild symmetric narrowing of each hip joint. Sacroiliac joints appear unremarkable. No erosive change. IMPRESSION: Mild symmetric narrowing of each hip joint. No fracture or dislocation. Electronically Signed   By: Lowella Grip III M.D.   On: 10/19/2018 09:13    Procedures Procedures (including critical care time)  Medications Ordered in ED Medications  oxyCODONE-acetaminophen (PERCOCET/ROXICET) 5-325 MG per tablet 1 tablet (1 tablet Oral Given 10/19/18 [id number])     Initial Impression / Assessment and Plan / ED Course  I have reviewed the triage vital signs and the nursing notes.  Pertinent labs & imaging results that were available during my care of the patient were reviewed by me and considered in my medical decision making (see chart for details).        Taylor Hancock is a 58 y.o. female with a past medical history significant for breast cancer, progressive multiple sclerosis, and frequent falls who presents with left leg pain and swelling for several weeks and a new fall several days ago causing left leg pains.  Patient reports that 3 days ago she had a fall at home in the bathroom and has been crawling around her house since that she is not able to bear weight.  She reports the pain is in her left hip, left knee, left ankle, and left toes.  She reports it is been swollen and harder to get around recently and was told she needed to get evaluated for possible blood clot.   She denies any fevers, chills, chest pain, shortness of breath, nausea, vomiting, urinary symptoms or GI symptoms.  No other chronic viral symptoms.  She denies any injury to her head neck or back.  No other injury aside from the left leg symptoms.  On exam, left leg is swollen and edematous.  Patient has tenderness in the left hip with movement.  Left knee tenderness is present.  Left ankle tenderness and left toe tenderness.  Patient has good pulses in the DP artery.  Normal sensation.  Patient has more weakness due to pain in her MS which she reports is at her baseline on the left.  No changes from her baseline by report.  Had a shared decision made conversation with patient we agreed to get x-rays and give the patient a pain pill.  Patient agrees to not get further imaging aside from the leg and pelvis and will hold on other labs given lack of other symptoms at this time.  Anticipate reassessment after work-up.  10:52 AM X-rays revealed a fracture of the distal fibula as well as multiple foot fractures.  Spoke with orthopedic surgery team who recommends boot placement and follow-up in clinic.  Patient will be given prescription for pain medication which works for her today.  She was instructed to be careful not to fall and her significant other is with her saying that he is taking leave to stay with her.  She reports she cannot use crutches but she has a chair at home to use.  They understood return precautions and follow-up instructions.  Patient discharged in good condition.   Final Clinical Impressions(s) / ED Diagnoses   Final diagnoses:  Closed fracture of distal end of left fibula, unspecified fracture morphology, initial encounter  Multiple closed fractures of foot, initial encounter  Fall, initial encounter    ED Discharge Orders         Ordered    oxyCODONE-acetaminophen (PERCOCET/ROXICET) 5-325 MG tablet  Every 4 hours PRN     10/19/18 1051          Clinical  Impression: 1. Closed fracture of distal end of left fibula, unspecified fracture morphology, initial encounter   2. Left hip pain   3. Multiple closed fractures of foot, initial encounter   4. Fall, initial encounter     Disposition: Discharge  Condition: Good  I have discussed the results, Dx and Tx plan with the pt(& family if present). He/she/they expressed understanding and agree(s) with the plan. Discharge instructions discussed at great length. Strict return precautions discussed and pt &/or family have verbalized understanding of the instructions. No further questions at time of discharge.    New Prescriptions   OXYCODONE-ACETAMINOPHEN (PERCOCET/ROXICET) 5-325 MG TABLET    Take 1 tablet by mouth every 4 (four) hours as needed for severe pain.    Follow Up: Leandrew Koyanagi, MD [place] [id number] [phone]     [facility] [address] [id number] [place] [phone]       Kalyn Hofstra, Gwenyth Allegra, MD 10/19/18 1053

## 2018-10-19 NOTE — Discharge Instructions (Signed)
Your work-up today showed evidence of fracture with your ankle and your foot.  Please keep the boot in place to help with support and follow-up with the orthopedics team.  Please use the pain medicine.  Please be careful not to fall again and be careful using the medications as we discussed.  Please use your chair at home and assistance needed.  If any symptoms change or worsen, please return to the nearest emergency department.

## 2018-10-19 NOTE — Telephone Encounter (Signed)
Dr. Felecia Shelling- FYI. You can view ED not in epic

## 2018-10-19 NOTE — ED Triage Notes (Signed)
Per EMS:  Pt was walking at home and fell.  Pt c/o left knee pain.  Pt has multiple falls due to MS.

## 2018-10-19 NOTE — ED Notes (Signed)
Pt unable to tolerate the closure of the [place]. She decided to try the post op shoe.

## 2018-10-24 ENCOUNTER — Telehealth: Payer: Self-pay | Admitting: Neurology

## 2018-10-24 NOTE — Telephone Encounter (Signed)
Per Dr. Felecia Shelling- ok for pt to hold off on therapy until she feels okay to continue therapy

## 2018-10-24 NOTE — Telephone Encounter (Signed)
Deneise Lever from [facility] called wanting to know if provider wanted to hold off on the pt's therapy due to her breaking 3 bones in her L foot. Please advise.

## 2018-10-24 NOTE — Telephone Encounter (Signed)
error 

## 2018-10-24 NOTE — Telephone Encounter (Signed)
Called, LVM relaying message below. Gave [facility] phone number if she has further questions.

## 2018-10-31 ENCOUNTER — Telehealth: Payer: Self-pay | Admitting: Neurology

## 2018-10-31 MED ORDER — OXYCODONE-ACETAMINOPHEN 5-325 MG PO TABS: ORAL_TABLET | ORAL | 0 refills | Status: DC

## 2018-10-31 NOTE — Telephone Encounter (Signed)
Called patient and left her a message to call me back.

## 2018-10-31 NOTE — Telephone Encounter (Signed)
Noted Patient's   husband is aware.

## 2018-10-31 NOTE — Telephone Encounter (Addendum)
Ok to refill per Dr. Felecia Shelling #15, 0 refills. 1 tablet po BID prn.  Checked drug registry. She received #15 oxycodone on 10/19/2018.   I called pt to let her know Dr. Felecia Shelling will refill #15. Explained she should take only as needed. He will not prescribe long term. She verbalized understanding.

## 2018-10-31 NOTE — Telephone Encounter (Signed)
Pt called wanting to know if she can get a refill on her oxyCODONE-acetaminophen (PERCOCET/ROXICET) 5-325 MG tablet that she was prescribed in the hospital for her fractures. To be sent in to the [facility] on [address]. Please advise.

## 2018-10-31 NOTE — Telephone Encounter (Signed)
Pt is calling in re: to her recent bad fall.  Pt states she has a scooter that is about 60 years old.  Pt states it is about to die. Pt states once this dies she will have no way to get around.  Pt is asking if there can be a rush put on her need of a new scooter.  Please call

## 2018-10-31 NOTE — Telephone Encounter (Signed)
Noted I have talked to Patient .

## 2018-10-31 NOTE — Telephone Encounter (Signed)
Patient called and wants to know can if Dr. Felecia Shelling can call in her RX.

## 2018-10-31 NOTE — Telephone Encounter (Signed)
Pt has returned the call to [facility].  Pt states it is about her nap time and is asking that Hinton Dyer tries her anytime after 3:00

## 2018-10-31 NOTE — Telephone Encounter (Signed)
Taylor Hancock- do you mind calling her back? I spent 15 min on the phone this morning with her explaining that we sent order to PT to do power mobility evaluation/Chloe and that she needed to follow up with them about this and next steps on working on getting her a new scooter. Thank you!

## 2018-10-31 NOTE — Telephone Encounter (Signed)
Taylor Hancock- we already sent it in for her today

## 2018-11-07 ENCOUNTER — Encounter: Payer: Self-pay | Admitting: Physical Therapy

## 2018-11-07 NOTE — Therapy (Signed)
[facility] [address] [place], [place], [address] Phone: [phone]   Fax:  [phone]  Patient Details  Name: Taylor Hancock MRN: [id number] Date of Birth: [birth year] Referring Provider:  No ref. provider found  Encounter Date: 11/07/2018   PHYSICAL THERAPY DISCHARGE SUMMARY  Visits from Start of Care: 5  Current functional level related to goals / functional outcomes: Have not checked LTGs- pt would be cancelling all future OPPT sessions in order to receive home health PT due to transportation barriers upon pt's request.    Remaining deficits: Impaired static/dynamic balance, decreased endurance, decreased LE strength.    Education / Equipment: Initial HEP.   Plan: Patient agrees to discharge.  Patient goals were not met. Patient is being discharged due to the patient's request.  ?????        Arliss Journey, PT, DPT  11/07/2018, 11:41 AM  [facility] [address] [place], [place], [address] Phone: [phone]   Fax:  [phone]

## 2018-11-10 ENCOUNTER — Telehealth: Payer: Self-pay | Admitting: Neurology

## 2018-11-10 DIAGNOSIS — G35 Multiple sclerosis: Secondary | ICD-10-CM

## 2018-11-10 DIAGNOSIS — G801 Spastic diplegic cerebral palsy: Secondary | ICD-10-CM

## 2018-11-10 DIAGNOSIS — R296 Repeated falls: Secondary | ICD-10-CM

## 2018-11-10 DIAGNOSIS — F09 Unspecified mental disorder due to known physiological condition: Secondary | ICD-10-CM

## 2018-11-10 DIAGNOSIS — R350 Frequency of micturition: Secondary | ICD-10-CM

## 2018-11-10 DIAGNOSIS — R261 Paralytic gait: Secondary | ICD-10-CM

## 2018-11-10 NOTE — Telephone Encounter (Signed)
Called, LVM for pt to call and discuss. She has called several times in the past asking for this. Each time, I instruct that we placed the referral to PT to do a power mobility evaluation so they can get her a new scooter. I instruct her to call physical therapy to follow up on this. Has she done this? This is how she can move forward in getting a new scotter

## 2018-11-10 NOTE — Telephone Encounter (Signed)
Pt called wanting to know if a scooter can be ordered for her. Please advise.

## 2018-11-10 NOTE — Telephone Encounter (Signed)
Pt called stating that she needs her new scooter. The message from RN Terrence Dupont was discussed with her, pt said she has spoken with Physical Therapy and they have told her she is going to have to be able to put weight on her foot before physical therapy can start.  Pt stated she will call PT back to see if more can be done on their end re: her getting a new scooter because her husband is having to charge her 49yr old scooter every night.  No call back requested

## 2018-11-14 NOTE — Telephone Encounter (Signed)
Dr. Felecia Shelling- what would you recommend? I am not sure what else to offer

## 2018-11-14 NOTE — Telephone Encounter (Signed)
Hinton Dyer- do you know of another place we can send her?

## 2018-11-14 NOTE — Telephone Encounter (Signed)
I addended her note from July to better address the mobility issues.  Please see if there is good her place will accept that note so that she can get the scooter.  If we need to, we can do another video visit or face-to-face visit.

## 2018-11-14 NOTE — Telephone Encounter (Signed)
Patient has bought her power wheel chair he loves it  . Please re- order home Health order she needs PT and Home Health aide assist with bathing  Light cooking and cleaning.   Going to find her another company.

## 2018-11-14 NOTE — Addendum Note (Signed)
Addended by: Hope Pigeon on: 11/14/2018 05:44 PM   Modules accepted: Orders

## 2018-11-14 NOTE — Telephone Encounter (Signed)
Pt called in and stated she is needing help and dont know where to go I advised her the message below per Terrence Dupont regarding the scooter she she has called them multiple times and they are stating they cant do anything for her. Pt has become emotional on the phone and states she just needs help from somebody

## 2018-11-15 NOTE — Telephone Encounter (Signed)
I have sent order to [facility] .

## 2018-11-17 ENCOUNTER — Other Ambulatory Visit: Payer: Self-pay | Admitting: Neurology

## 2018-11-17 MED ORDER — OXYCODONE-ACETAMINOPHEN 5-325 MG PO TABS: ORAL_TABLET | ORAL | 0 refills | Status: DC

## 2018-11-17 NOTE — Telephone Encounter (Signed)
I called and gave her Dr Felecia Shelling recommendations below that he does not manage fracture joints or orthopedic issues. He can only prescribed 15 pills with no refills. He wants pt to seek her PCP have them refer her to orthopedic to manage, PT stated she is unable to walk at times. She wanted to schedule a virtual visit with Dr.Sater. I advise pt he recommend she call her PCP to be evaluated for her fracture toes. I stated per Dr.Sater this is not related to her MS or a neurological issue.Pt verbalized understanding.

## 2018-11-17 NOTE — Telephone Encounter (Signed)
I spoke with Dr. Felecia Shelling and can only give pt one last refill of oxycodone 15 pills with no refills. He stated pts pain is orthopedic and not neurological or related to her MS. He stated if pain persist she has to call her PCP and have them refer her to a orthopedic MD because he does not manage fracture joints.

## 2018-11-17 NOTE — Telephone Encounter (Signed)
Pt has called asking for a refill script of the oxyCODONE-acetaminophen (PERCOCET/ROXICET) 5-325 MG tablet. Pt stated that she has not been taking the pills, pt then stated she's only been taking half.  Pt then stated she doesn't know what is going on.  Pt then stated she does not feel she is going to make it through the weekend and again would like to know if Dr Felecia Shelling could call in more for her.  Pt stated she doesn't like to take pills but the pain is often very bad.  [phone]

## 2018-11-21 NOTE — Telephone Encounter (Signed)
Called patient and relayed  Taylor Hancock, [employer] [facility]        [employer] is covered by our [employer] [employer] branch. They just informed me that they do not have the capacity to accept this patient right now. I'm sorry.     Im still working on some one to accept her.

## 2018-11-23 NOTE — Telephone Encounter (Signed)
Patient states she is ok with holding off on PT patient states she just needs help with a bath. I have contacted Bruce at [facility] and he can help patient  [facility] care Telephone [phone] fax [phone]

## 2018-11-24 NOTE — Telephone Encounter (Signed)
[name] called and offered Taylor Hancock services for this week . Patient relayed she had to talk to her husband . Bruce and I both try to call patient back to try to get her to start care as far as a bath , cleaning cooking etc. Patient did not answer the phone.

## 2018-11-25 ENCOUNTER — Ambulatory Visit (INDEPENDENT_AMBULATORY_CARE_PROVIDER_SITE_OTHER): Payer: 59 | Admitting: Orthopaedic Surgery

## 2018-11-25 ENCOUNTER — Encounter: Payer: Self-pay | Admitting: Orthopaedic Surgery

## 2018-11-25 ENCOUNTER — Other Ambulatory Visit: Payer: Self-pay

## 2018-11-25 ENCOUNTER — Ambulatory Visit: Payer: Self-pay

## 2018-11-25 VITALS — Ht 64.0 in

## 2018-11-25 DIAGNOSIS — M25572 Pain in left ankle and joints of left foot: Secondary | ICD-10-CM

## 2018-11-25 NOTE — Progress Notes (Signed)
Office Visit Note   Patient: Taylor Hancock           Date of Birth: 08-11-1960           MRN: [id number] Visit Date: 11/25/2018              Requested by: Shirline Frees, MD [address],  [place] [address] PCP: Shirline Frees, MD   Assessment & Plan: Visit Diagnoses:  1. Pain in left ankle and joints of left foot     Plan: Impression is healed left distal fibula fracture.  We will provide the patient with an ASO brace to help with transfers.  Will allow her to restart her home health physical therapy.  She will follow up with [place] as needed.  Follow-Up Instructions: Return if symptoms worsen or fail to improve.   Orders:  Orders Placed This Encounter  Procedures  . XR Ankle Complete Left   No orders of the defined types were placed in this encounter.     Procedures: No procedures performed   Clinical Data: No additional findings.   Subjective: Chief Complaint  Patient presents with  . Left Ankle - Injury    HPI patient is a pleasant 58 year old female with primary progressive multiple sclerosis who comes in today for follow-up of her left distal fibula fracture.  She sustained this at the end of September of this year.  She was seen in the ED where x-rays were obtained.  Due to her underlying MS and inability to transfer over the past 5 weeks she was not seen in follow-up.  She comes in today for further evaluation and treatment recommendation.  She notes that she is in her wheelchair 98% of the time but does stand for transfers.  She notes moderate pain to the left ankle which occurs almost constantly.  She takes Percocet which is given to her by her neurologist.  This is not significantly helped her pain.  Review of Systems as detailed in HPI.  All others reviewed and are negative.   Objective: Vital Signs: Ht 5\' 4"  (1.626 m)   BMI 30.30 kg/m   Physical Exam well-developed well-nourished female no acute distress.  Alert oriented x3.   Ortho Exam examination of the left ankle reveals mild tenderness to the distal fibula.  No tenderness elsewhere.  Moderate swelling to both lower extremities.  Calf is soft and nontender.  Limited range of motion secondary to weakness.  Specialty Comments:  No specialty comments available.  Imaging: Xr Ankle Complete Left  Result Date: 11/25/2018 Healed distal fibula fracture with evidence of disuse osteopenia throughout    PMFS History: Patient Active Problem List   Diagnosis Date Noted  . Malignant neoplasm of upper-outer quadrant of left breast in female, estrogen receptor positive ([place]) 03/09/2017  . Urinary hesitancy 01/27/2017  . Mild cognitive disorder 06/09/2016  . Genetic testing 12/17/2015  . Family history of breast cancer   . Family history of ovarian cancer   . Family history of pancreatic cancer   . Insomnia 06/10/2015  . Urinary frequency 06/10/2015  . Frequent falls 12/26/2014  . Snoring 12/26/2014  . Depression 08/01/2014  . Other fatigue 08/01/2014  . Multiple sclerosis, primary progressive ([place]) 02/07/2014  . Spastic gait 02/07/2014  . Spastic diplegia ([place]) 02/07/2014  . Numbness 02/07/2014  . Foot drop, left 02/07/2014   Past Medical History:  Diagnosis Date  . Anxiety   . Complication of anesthesia    patient was  awake during colonoscopy as drugs were not effective-reports that propofol is an effective analgesic for her  . Concussion   . Depression   . Family history of breast cancer   . Family history of ovarian cancer   . Family history of pancreatic cancer   . Fibromyalgia   . History of radiation therapy 05/20/17- 06/17/17   Left Breast/ 40.05 Gy in 15 fractions, Left Breast Boost/ 10 Gy in 5 fractions.   . Movement disorder   . MS (multiple sclerosis) ([employer])    primary progressive MS  . Multiple sclerosis ([place])   . Neuropathy   . Osteoporosis   . Vision abnormalities     Family History  Problem Relation Age of Onset  . Hypertension  Mother   . Breast cancer Mother 75  . Hypothyroidism Mother   . Hyperlipidemia Father   . Diabetes Father   . Pancreatic cancer Father 67  . Hypothyroidism Sister   . Ovarian cancer Maternal Aunt        dx in her 35s  . Parkinson's disease Maternal Uncle   . Stroke Maternal Uncle   . Breast cancer Maternal Grandmother 100  . Breast cancer Maternal Aunt        dx under 29  . Breast cancer Maternal Aunt        dx over 55s  . Breast cancer Maternal Aunt        dx >50  . Breast cancer Other   . Hypothyroidism Brother   . Breast cancer Cousin   . Breast cancer Sister     Past Surgical History:  Procedure Laterality Date  . BREAST LUMPECTOMY WITH RADIOACTIVE SEED AND SENTINEL LYMPH NODE BIOPSY Left 03/31/2017   Procedure: BREAST LUMPECTOMY WITH RADIOACTIVE SEED AND SENTINEL LYMPH NODE BIOPSY;  Surgeon: Stark Klein, MD;  Location: [facility];  Service: General;  Laterality: Left;  . FRACTURE SURGERY Left    hand  . I&D EXTREMITY  05/30/2011   Procedure: IRRIGATION AND DEBRIDEMENT EXTREMITY;  Surgeon: Tennis Must, MD;  [facility];  Service: Orthopedics;;  Incision and drainage of open Proximal interphalangeal joint with closed reduction of Proximal interphalangeal  joint left long finger  . KNEE ARTHROSCOPY     Social History   Occupational History  . Not on file  Tobacco Use  . Smoking status: Former Smoker    Packs/day: 0.50    Types: Cigarettes    Quit date: 11/25/2012    Years since quitting: 6.0  . Smokeless tobacco: Never Used  Substance and Sexual Activity  . Alcohol use: Yes    Comment: 12 alcohol drinks weekly  . Drug use: No  . Sexual activity: Not on file

## 2018-12-02 ENCOUNTER — Telehealth: Payer: Self-pay | Admitting: Neurology

## 2018-12-02 NOTE — Telephone Encounter (Signed)
Pt called stating that she has received a letter from [facility] and it states that the pt is eligable for Home Health and that they have sent a copy to the provider. Please advise.

## 2018-12-07 ENCOUNTER — Other Ambulatory Visit: Payer: Self-pay | Admitting: *Deleted

## 2018-12-07 ENCOUNTER — Other Ambulatory Visit: Payer: Self-pay | Admitting: Neurology

## 2018-12-07 MED ORDER — CLONAZEPAM 1 MG PO TABS: ORAL_TABLET | ORAL | 1 refills | Status: DC

## 2018-12-08 NOTE — Telephone Encounter (Signed)
I have talked to cheryl [facility] is going to take patient and she is aware . [phone].

## 2018-12-13 ENCOUNTER — Encounter (HOSPITAL_COMMUNITY): Payer: Self-pay | Admitting: Emergency Medicine

## 2018-12-13 ENCOUNTER — Other Ambulatory Visit: Payer: Self-pay

## 2018-12-13 ENCOUNTER — Emergency Department (HOSPITAL_COMMUNITY): Payer: 59

## 2018-12-13 ENCOUNTER — Emergency Department (HOSPITAL_COMMUNITY)
Admission: EM | Admit: 2018-12-13 | Discharge: 2018-12-13 | Disposition: A | Discharge status: Home / Self Care | Payer: 59 | Attending: Emergency Medicine | Admitting: Emergency Medicine

## 2018-12-13 DIAGNOSIS — Z91040 Latex allergy status: Secondary | ICD-10-CM | POA: Diagnosis not present

## 2018-12-13 DIAGNOSIS — Z853 Personal history of malignant neoplasm of breast: Secondary | ICD-10-CM | POA: Diagnosis not present

## 2018-12-13 DIAGNOSIS — G35 Multiple sclerosis: Secondary | ICD-10-CM | POA: Insufficient documentation

## 2018-12-13 DIAGNOSIS — E860 Dehydration: Secondary | ICD-10-CM | POA: Diagnosis not present

## 2018-12-13 DIAGNOSIS — Z20828 Contact with and (suspected) exposure to other viral communicable diseases: Secondary | ICD-10-CM | POA: Diagnosis not present

## 2018-12-13 DIAGNOSIS — Z79899 Other long term (current) drug therapy: Secondary | ICD-10-CM | POA: Insufficient documentation

## 2018-12-13 DIAGNOSIS — Z87891 Personal history of nicotine dependence: Secondary | ICD-10-CM | POA: Insufficient documentation

## 2018-12-13 DIAGNOSIS — R4182 Altered mental status, unspecified: Secondary | ICD-10-CM | POA: Diagnosis present

## 2018-12-13 LAB — COMPREHENSIVE METABOLIC PANEL
ALT: 73 U/L — ABNORMAL HIGH (ref 0–44)
AST: 145 U/L — ABNORMAL HIGH (ref 15–41)
Albumin: 3.3 g/dL — ABNORMAL LOW (ref 3.5–5.0)
Alkaline Phosphatase: 55 U/L (ref 38–126)
Anion gap: 12 (ref 5–15)
BUN: <5 mg/dL — ABNORMAL LOW (ref 6–20)
CO2: 24 mmol/L (ref 22–32)
Calcium: 8.6 mg/dL — ABNORMAL LOW (ref 8.9–10.3)
Chloride: 104 mmol/L (ref 98–111)
Creatinine, Ser: 0.82 mg/dL (ref 0.44–1.00)
GFR calc Af Amer: >60 mL/min (ref >60)
GFR calc non Af Amer: >60 mL/min (ref >60)
Glucose, Bld: 195 mg/dL — ABNORMAL HIGH (ref 70–99)
Potassium: 3.7 mmol/L (ref 3.5–5.1)
Sodium: 140 mmol/L (ref 135–145)
Total Bilirubin: 1.5 mg/dL — ABNORMAL HIGH (ref 0.3–1.2)
Total Protein: 6.6 g/dL (ref 6.5–8.1)

## 2018-12-13 LAB — URINALYSIS, ROUTINE W REFLEX MICROSCOPIC
Bilirubin Urine: NEGATIVE
Glucose, UA: 50 mg/dL — AB
Hgb urine dipstick: NEGATIVE
Ketones, ur: 5 mg/dL — AB
Leukocytes,Ua: NEGATIVE
Nitrite: NEGATIVE
Protein, ur: NEGATIVE mg/dL
Specific Gravity, Urine: 1.019 (ref 1.005–1.030)
pH: 5 (ref 5.0–8.0)

## 2018-12-13 LAB — CBC WITH DIFFERENTIAL/PLATELET
Abs Immature Granulocytes: 0.02 10*3/uL (ref 0.00–0.07)
Basophils Absolute: 0.1 10*3/uL (ref 0.0–0.1)
Basophils Relative: 2 %
Eosinophils Absolute: 0.1 10*3/uL (ref 0.0–0.5)
Eosinophils Relative: 2 %
HCT: 42.5 % (ref 36.0–46.0)
Hemoglobin: 14.7 g/dL (ref 12.0–15.0)
Immature Granulocytes: 0 %
Lymphocytes Relative: 30 %
Lymphs Abs: 1.4 10*3/uL (ref 0.7–4.0)
MCH: 37.9 pg — ABNORMAL HIGH (ref 26.0–34.0)
MCHC: 34.6 g/dL (ref 30.0–36.0)
MCV: 109.5 fL — ABNORMAL HIGH (ref 80.0–100.0)
Monocytes Absolute: 0.5 10*3/uL (ref 0.1–1.0)
Monocytes Relative: 10 %
Neutro Abs: 2.6 10*3/uL (ref 1.7–7.7)
Neutrophils Relative %: 56 %
Platelets: 165 10*3/uL (ref 150–400)
RBC: 3.88 MIL/uL (ref 3.87–5.11)
RDW: 13.6 % (ref 11.5–15.5)
WBC: 4.6 10*3/uL (ref 4.0–10.5)
nRBC: 0 % (ref 0.0–0.2)

## 2018-12-13 LAB — SARS CORONAVIRUS 2 (TAT 6-24 HRS): SARS Coronavirus 2: NEGATIVE

## 2018-12-13 LAB — CBG MONITORING, ED: Glucose-Capillary: 211 mg/dL — ABNORMAL HIGH (ref 70–99)

## 2018-12-13 LAB — LACTIC ACID, PLASMA: Lactic Acid, Venous: 2.1 mmol/L (ref 0.5–1.9)

## 2018-12-13 MED ORDER — SODIUM CHLORIDE 0.9 % IV BOLUS
1000.0000 mL | Freq: Once | INTRAVENOUS | Status: AC
  Administered 2018-12-13: 1000 mL via INTRAVENOUS

## 2018-12-13 MED ORDER — SODIUM CHLORIDE 0.9 % IV SOLN: 1000.0000 mg | Freq: Once | INTRAVENOUS | Status: DC

## 2018-12-13 MED ORDER — GADOBUTROL 1 MMOL/ML IV SOLN
8.0000 mL | Freq: Once | INTRAVENOUS | Status: AC | PRN
  Administered 2018-12-13: 8 mL via INTRAVENOUS

## 2018-12-13 NOTE — ED Triage Notes (Signed)
Pt in from home via [facility] with AMS, LSN 2 days ago per husband. Husband also reports hallucinations x 3 wks. Per EMS, pt was hypotensive - 98/50 when they arrived. Given 927ml NS en route, repeat 120/84. Pt's speech slurred and aphasic at times. Hx of fall 2 mo's ago, states "she hasn't been the same since", a&ox4 at this time, but answers delayed. CBG 278, hx MS and Breast CA

## 2018-12-13 NOTE — ED Notes (Signed)
Pt transported to CT ?

## 2018-12-13 NOTE — ED Provider Notes (Signed)
  Physical Exam  BP 139/82   Pulse 85   Temp (!) 97.2 F (36.2 C) (Oral)   Resp 14   Ht 5\' 4"  (1.626 m)   Wt 80.1 kg   SpO2 100%   BMI 30.31 kg/m   Physical Exam  ED Course/Procedures     Procedures  MDM  Received patient in signout.  Plan after discussion with patient's neurologist was to get Solu-Medrol here 1 g.  MRI to be done and if shows new active changes admit for further steroids.  However patient refuses the first dose of the steroids.  States she does not like steroids.  MRI reassuring.  Does notshow active disease.  Will discharge home.  Patient does not want the steroids now.  Discharge home.       Davonna Belling, MD 12/13/18 Kathyrn Drown

## 2018-12-13 NOTE — ED Notes (Signed)
Pt refused Solu medrol  Dr. Alvino Chapel made aware

## 2018-12-13 NOTE — ED Provider Notes (Signed)
[facility] EMERGENCY DEPARTMENT Provider Note   CSN: [id number] Arrival date & time: 12/13/18  0840     History   Chief Complaint Chief Complaint  Patient presents with   Altered Mental Status   Fatigue   Hypotension    HPI Taylor Hancock is a 58 y.o. female.     Pt presents to the ED today with weakness.  The pt has a hx of primary progressive MS and sees Dr. Felecia Shelling (neurology).  She has had progressive weakness for months.  She has splastic diplegia from the MS and has been using a scooter.  She said she has had multiple falls.  The pt said she's had frequent falls.  She fell in September and broke her left foot.  She has had decreased appetite since that fall.  She has not felt like getting up and doing anything.  EMS said her husband told them that she's been having hallucinations for the past 3 weeks.     Past Medical History:  Diagnosis Date   Anxiety    Complication of anesthesia    patient was awake during colonoscopy as drugs were not effective-reports that propofol is an effective analgesic for her   Concussion    Depression    Family history of breast cancer    Family history of ovarian cancer    Family history of pancreatic cancer    Fibromyalgia    History of radiation therapy 05/20/17- 06/17/17   Left Breast/ 40.05 Gy in 15 fractions, Left Breast Boost/ 10 Gy in 5 fractions.    Movement disorder    MS (multiple sclerosis) ([place])    primary progressive MS   Multiple sclerosis ([place])    Neuropathy    Osteoporosis    Vision abnormalities     Patient Active Problem List   Diagnosis Date Noted   Malignant neoplasm of upper-outer quadrant of left breast in female, estrogen receptor positive ([place]) 03/09/2017   Urinary hesitancy 01/27/2017   Mild cognitive disorder 06/09/2016   Genetic testing 12/17/2015   Family history of breast cancer    Family history of ovarian cancer    Family history of pancreatic cancer      Insomnia 06/10/2015   Urinary frequency 06/10/2015   Frequent falls 12/26/2014   Snoring 12/26/2014   Depression 08/01/2014   Other fatigue 08/01/2014   Multiple sclerosis, primary progressive ([place]) 02/07/2014   Spastic gait 02/07/2014   Spastic diplegia ([place]) 02/07/2014   Numbness 02/07/2014   Foot drop, left 02/07/2014    Past Surgical History:  Procedure Laterality Date   BREAST LUMPECTOMY WITH RADIOACTIVE SEED AND SENTINEL LYMPH NODE BIOPSY Left 03/31/2017   Procedure: BREAST LUMPECTOMY WITH RADIOACTIVE SEED AND SENTINEL LYMPH NODE BIOPSY;  Surgeon: Stark Klein, MD;  Location: [facility];  Service: General;  Laterality: Left;   FRACTURE SURGERY Left    hand   I&D EXTREMITY  05/30/2011   Procedure: IRRIGATION AND DEBRIDEMENT EXTREMITY;  Surgeon: Tennis Must, MD;  [facility];  Service: Orthopedics;;  Incision and drainage of open Proximal interphalangeal joint with closed reduction of Proximal interphalangeal  joint left long finger   KNEE ARTHROSCOPY       OB History   No obstetric history on file.      Home Medications    Prior to Admission medications   Medication Sig Start Date End Date Taking? Authorizing Provider  calcium citrate-vitamin D (CITRACAL+D) 315-200 MG-UNIT tablet Take 1 tablet by mouth 2 (two)  times daily.   Yes [provider]  cholecalciferol (VITAMIN D) 1000 units tablet Take 1,000 Units by mouth daily. Pt reports 2000 UNITS daily, somedays up to 8000 UNITS.   Yes [provider]  clonazePAM (KLONOPIN) 1 MG tablet Take 4 pills po qHS Patient taking differently: Take 4 mg by mouth at bedtime. Take 4 pills po qHS 12/07/18  Yes Sater, Nanine Means, MD  cyclobenzaprine (FLEXERIL) 5 MG tablet TAKE 1 TO 2 TABLETS BY MOUTH EVERY DAY AT BEDTIME Patient taking differently: Take 5-10 mg by mouth at bedtime.  09/14/18  Yes Sater, Nanine Means, MD  oxyCODONE-acetaminophen (PERCOCET/ROXICET) 5-325 MG tablet Take 1 tablet by mouth twice  daily as needed 11/17/18  Yes Sater, Nanine Means, MD  pantoprazole (PROTONIX) 40 MG tablet Take 40 mg by mouth daily.   Yes [provider]  tiZANidine (ZANAFLEX) 4 MG tablet TAKE 2 TABLETS (8 MG TOTAL) BY MOUTH AT BEDTIME. 08/01/18  Yes Sater, Nanine Means, MD  solifenacin (VESICARE) 5 MG tablet Take 1 tablet (5 mg total) by mouth daily. Patient not taking: Reported on 12/13/2018 08/11/18   Britt Bottom, MD    Family History Family History  Problem Relation Age of Onset   Hypertension Mother    Breast cancer Mother 65   Hypothyroidism Mother    Hyperlipidemia Father    Diabetes Father    Pancreatic cancer Father 58   Hypothyroidism Sister    Ovarian cancer Maternal Aunt        dx in her 21s   Parkinson's disease Maternal Uncle    Stroke Maternal Uncle    Breast cancer Maternal Grandmother 100   Breast cancer Maternal Aunt        dx under 65   Breast cancer Maternal Aunt        dx over 23s   Breast cancer Maternal Aunt        dx >50   Breast cancer Other    Hypothyroidism Brother    Breast cancer Cousin    Breast cancer Sister     Social History Social History   Tobacco Use   Smoking status: Former Smoker    Packs/day: 0.50    Types: Cigarettes    Quit date: 11/25/2012    Years since quitting: 6.0   Smokeless tobacco: Never Used  Substance Use Topics   Alcohol use: Yes    Comment: 12 alcohol drinks weekly   Drug use: No     Allergies   Letrozole, Levaquin [levofloxacin in d5w], Ppa-mma express [compleat], Zithromax [azithromycin], Latex, and Ultram [tramadol hcl]   Review of Systems Review of Systems  Neurological: Positive for weakness.  All other systems reviewed and are negative.    Physical Exam Updated Vital Signs BP 139/82    Pulse 85    Temp (!) 97.2 F (36.2 C) (Oral)    Resp 14    Ht 5\' 4"  (1.626 m)    Wt 80.1 kg    SpO2 100%    BMI 30.31 kg/m   Physical Exam Vitals signs and nursing note reviewed.    Constitutional:      Appearance: Normal appearance.  HENT:     Head: Normocephalic and atraumatic.     Right Ear: External ear normal.     Left Ear: External ear normal.     Nose: Nose normal.     Mouth/Throat:     Mouth: Mucous membranes are dry.  Eyes:     Extraocular Movements: Extraocular  movements intact.     Conjunctiva/sclera: Conjunctivae normal.     Pupils: Pupils are equal, round, and reactive to light.  Neck:     Musculoskeletal: Normal range of motion and neck supple.  Cardiovascular:     Rate and Rhythm: Normal rate and regular rhythm.     Pulses: Normal pulses.     Heart sounds: Normal heart sounds.  Pulmonary:     Effort: Pulmonary effort is normal.     Breath sounds: Normal breath sounds.  Abdominal:     General: Abdomen is flat. Bowel sounds are normal.     Palpations: Abdomen is soft.  Musculoskeletal: Normal range of motion.  Skin:    General: Skin is warm and dry.     Capillary Refill: Capillary refill takes less than 2 seconds.  Neurological:     Mental Status: She is alert.     Comments: Generalized weakness.  Left leg and arm spastic hemiplegia      ED Treatments / Results  Labs (all labs ordered are listed, but only abnormal results are displayed) Labs Reviewed  CBC WITH DIFFERENTIAL/PLATELET - Abnormal; Notable for the following components:      Result Value   MCV 109.5 (*)    MCH 37.9 (*)    All other components within normal limits  COMPREHENSIVE METABOLIC PANEL - Abnormal; Notable for the following components:   Glucose, Bld 195 (*)    BUN <5 (*)    Calcium 8.6 (*)    Albumin 3.3 (*)    AST 145 (*)    ALT 73 (*)    Total Bilirubin 1.5 (*)    All other components within normal limits  URINALYSIS, ROUTINE W REFLEX MICROSCOPIC - Abnormal; Notable for the following components:   Color, Urine AMBER (*)    APPearance HAZY (*)    Glucose, UA 50 (*)    Ketones, ur 5 (*)    All other components within normal limits  LACTIC ACID,  PLASMA - Abnormal; Notable for the following components:   Lactic Acid, Venous 2.1 (*)    All other components within normal limits  CBG MONITORING, ED - Abnormal; Notable for the following components:   Glucose-Capillary 211 (*)    All other components within normal limits  SARS CORONAVIRUS 2 (TAT 6-24 HRS)    EKG EKG Interpretation  Date/Time:  Tuesday December 13 2018 08:50:43 EST Ventricular Rate:  68 PR Interval:    QRS Duration: 94 QT Interval:  434 QTC Calculation: 462 R Axis:   48 Text Interpretation: Sinus rhythm Low voltage, precordial leads Borderline T abnormalities, anterior leads No significant change since last tracing Confirmed by Isla Pence [phone]) on 12/13/2018 8:59:36 AM   Radiology Dg Chest 2 View  Result Date: 12/13/2018 CLINICAL DATA:  Altered mental status. EXAM: CHEST - 2 VIEW COMPARISON:  Plain film of the chest 02/04/2015. CT chest, abdomen and pelvis 12/21/2017. FINDINGS: The lungs are clear. Heart size is normal. Aortic atherosclerosis is noted. No pneumothorax or pleural effusion. Surgical clips are seen in the left breast. IMPRESSION: No acute disease. Atherosclerosis. Electronically Signed   By: Inge Rise M.D.   On: 12/13/2018 09:52   Ct Head Wo Contrast  Result Date: 12/13/2018 CLINICAL DATA:  Altered mental status EXAM: CT HEAD WITHOUT CONTRAST TECHNIQUE: Contiguous axial images were obtained from the base of the skull through the vertex without intravenous contrast. COMPARISON:  CT 2012, MRI 2017 FINDINGS: Brain: There is no acute intracranial hemorrhage, mass-effect, or edema.  Gray-white differentiation is preserved. There is no extra-axial fluid collection. Ventricles and sulci are within normal limits in size and configuration. Patchy hypoattenuation in the supratentorial white matter is nonspecific but may reflect chronic microvascular ischemic changes and are demyelination associated with reported history of multiple sclerosis.  Vascular: No hyperdense vessel or unexpected calcification. Skull: Calvarium is unremarkable. Sinuses/Orbits: No acute finding. Other: None. IMPRESSION: No acute intracranial hemorrhage, mass effect, or evidence of acute infarction. Mild nonspecific white matter changes that may reflect demyelination associated with reported history of multiple sclerosis or chronic microvascular ischemic changes. Electronically Signed   By: Macy Mis M.D.   On: 12/13/2018 10:32    Procedures Procedures (including critical care time)  Medications Ordered in ED Medications  sodium chloride 0.9 % bolus 1,000 mL (0 mLs Intravenous Stopped 12/13/18 1304)     Initial Impression / Assessment and Plan / ED Course  I have reviewed the triage vital signs and the nursing notes.  Pertinent labs & imaging results that were available during my care of the patient were reviewed by me and considered in my medical decision making (see chart for details).       Pt d/w Dr. Leonel Ramsay who recommended MRI brain/cervical spine.  Pt requested that I speak with Dr. Felecia Shelling.  I spoke with him.  He is ok with MRI.  He requested that we give her 1g of solumedrol now.  It is ok to d/c if MRI shows nothing acute.  He will follow in the office.  If that shows an acute exacerbation, admission.  Pt signed out to Dr. Alvino Chapel.  Final Clinical Impressions(s) / ED Diagnoses   Final diagnoses:  Dehydration    ED Discharge Orders    None       Isla Pence, MD 12/13/18 1625

## 2018-12-13 NOTE — ED Notes (Signed)
Pt in MRI at this time 

## 2018-12-13 NOTE — ED Notes (Signed)
Purewick catheter applied upon arrival. Pt has yet to void, unable to collect urinalysis at this time. Pt aware of need for sample.

## 2018-12-20 ENCOUNTER — Telehealth: Payer: Self-pay | Admitting: Neurology

## 2018-12-20 DIAGNOSIS — R269 Unspecified abnormalities of gait and mobility: Secondary | ICD-10-CM

## 2018-12-20 DIAGNOSIS — G35 Multiple sclerosis: Secondary | ICD-10-CM

## 2018-12-20 NOTE — Telephone Encounter (Signed)
Called, LVM for [facility]) to see if pt is established there and if referral needed for OT.

## 2018-12-20 NOTE — Telephone Encounter (Signed)
Pt called and wanted to inform us that she is ready to start her OT again and is wanting to know what the next step is to get this started. Please advise.

## 2018-12-22 NOTE — Telephone Encounter (Addendum)
Tried calling pt back at [phone] twice. Received response that number not working.   Tried home number [phone] and spoke with patient. [facility] worked with [facility]..  She is wanting help some days in the home with minor chores/PT. Advised we previously sent referral to [facility] 11/14/18. Instructed her to call them at [phone] to see about getting scheduled.

## 2018-12-26 ENCOUNTER — Telehealth: Payer: Self-pay | Admitting: Neurology

## 2018-12-26 NOTE — Telephone Encounter (Signed)
Pt is asking that Dr Felecia Shelling calls in for her what he called in previously for her frequent urinating/  Pt states she often goes to the bathroom 6-7 times a night and will still wake up wet in her adult diaper.  Please call [phone]

## 2018-12-26 NOTE — Telephone Encounter (Signed)
I tired to reach the pt this afternoon to pass along [name]'s message. I received no answer and the vm was not setup to leave a message.

## 2018-12-26 NOTE — Telephone Encounter (Signed)
Tried calling pt back, VM not set up and could not LVM.  If she is referring to rx Vesicare (for overactive bladder) [facility]

## 2018-12-27 NOTE — Telephone Encounter (Signed)
Pt called and updated phone number and also states she would like a prescription for [facility] due to the Company in [place] can not help her at this time with her OT and PT. Please advise.

## 2018-12-28 NOTE — Addendum Note (Signed)
Addended by: Hope Pigeon on: 12/28/2018 07:27 AM   Modules accepted: Orders

## 2018-12-28 NOTE — Telephone Encounter (Signed)
Hinton Dyer- FYI--Placed referral as requested

## 2018-12-29 NOTE — Telephone Encounter (Signed)
Called and spoke to patient and told patient she really needed to Call [facility] back and get scheduled . [phone] . [facility] Has been waiting to here from patient and she has not called. Per [facility] the last time they called her she did not want to schedule and she would call them when she is ready.   When I spoke to patient today she relayed she did not have there number I asked her if she could take the number down she couldn't because she was taking care of her husband and he was sick . I called patient back and left her voicemail with there number.   Patient has been accepted with [facility]  and she has been. Thanks Hinton Dyer

## 2019-02-02 ENCOUNTER — Telehealth: Payer: Self-pay

## 2019-02-02 DIAGNOSIS — G35 Multiple sclerosis: Secondary | ICD-10-CM

## 2019-02-02 NOTE — Telephone Encounter (Signed)
Neoma Laming called to report patient is wwanting to resume physical therapy and would Need orders for PT and home health aide sent to her in order to resume along with patients demographics and med list.  [phone]

## 2019-02-02 NOTE — Telephone Encounter (Signed)
Placed referral as requested.

## 2019-02-02 NOTE — Telephone Encounter (Signed)
Order has been sent to [facility] [phone]

## 2019-02-04 ENCOUNTER — Other Ambulatory Visit: Payer: Self-pay | Admitting: Neurology

## 2019-02-06 NOTE — Telephone Encounter (Signed)
Meribeth Mattes called again Thursday afternoon and LVM stating that she has spoken to the therapist and the therapist stated that hte pt is not appropriate for home health care anymore she is needing 24hr care or needing to got to a skilled nursing facility. Neoma Laming states that if you need to call her back you can call [phone]

## 2019-02-06 NOTE — Telephone Encounter (Signed)
Please let the patient know what home health said.  Since she is having more difficulty, she might need to be at a skilled nursing facility and could get some physical therapy while there.

## 2019-02-06 NOTE — Telephone Encounter (Signed)
Dr. Felecia Shelling- how would you like to proceed?

## 2019-02-07 NOTE — Telephone Encounter (Signed)
Pt returned call , I advised patient of information below and she stated she will not be leaving her home and she needs someone there to help her around the house, she stated she has declined some due to [facility] dropping the ball on her. Patient is requesting a call back

## 2019-02-07 NOTE — Telephone Encounter (Signed)
Pt returned call. Please call back when available. 

## 2019-02-07 NOTE — Telephone Encounter (Signed)
Called, LVM for pt to call office. 

## 2019-02-07 NOTE — Telephone Encounter (Signed)
Called, LVM returning pt's call 

## 2019-02-07 NOTE — Telephone Encounter (Signed)
I called pt back. Relayed per [facility] that they feel she needs 24 hour care or skilled nursing facility. She states she does not want to go to skilled nursing facility. They are going to look into paid CNA to come help her. I gave her phone number to [facility] out of [place] at [phone] to see about getting help with personal care/housekeeping. She will give them a call.  Also recommended she call [facility] back to further discuss. She also is going to ask about getting her new scooter. She will call back if any further questions.

## 2019-02-09 NOTE — Telephone Encounter (Addendum)
I have already discussed this with pt the other day and pt declined 24 hour care/skilled nursing facility. Dr. Felecia Shelling aware.

## 2019-02-09 NOTE — Telephone Encounter (Signed)
Fulton Mole @ [facility] has called to report that pt has called them directly asking for PT.  Hilda Blades said pt is not suited for their care, she is in need of 24 hour care.  Hilda Blades states she can be reached at [phone] if RN needs to speak with her.

## 2019-03-07 ENCOUNTER — Ambulatory Visit (INDEPENDENT_AMBULATORY_CARE_PROVIDER_SITE_OTHER): Payer: 59 | Admitting: Neurology

## 2019-03-07 ENCOUNTER — Telehealth: Payer: Self-pay | Admitting: Neurology

## 2019-03-07 ENCOUNTER — Encounter: Payer: Self-pay | Admitting: Neurology

## 2019-03-07 DIAGNOSIS — G801 Spastic diplegic cerebral palsy: Secondary | ICD-10-CM | POA: Diagnosis not present

## 2019-03-07 DIAGNOSIS — G35 Multiple sclerosis: Secondary | ICD-10-CM

## 2019-03-07 DIAGNOSIS — R296 Repeated falls: Secondary | ICD-10-CM

## 2019-03-07 DIAGNOSIS — R261 Paralytic gait: Secondary | ICD-10-CM

## 2019-03-07 DIAGNOSIS — R350 Frequency of micturition: Secondary | ICD-10-CM

## 2019-03-07 DIAGNOSIS — R5383 Other fatigue: Secondary | ICD-10-CM

## 2019-03-07 NOTE — Telephone Encounter (Addendum)
Called pt back. She was trying to go out to get her medicine today from the pharmacy and she fell down four steps onto her left knee. She was still on the floor at time of our call. She declined me calling for someone to come help her. She did not have any friends/family to help her. She states she is ok, no severe injuries. She is trying to get her friend to get her medications. She uses her scooter at home and is trying to get herself back into her scooter. Received verbal ok to switch her f/u to telephone visit. I updated her chart info. She is aware Dr. Felecia Shelling will call her about 12:45p today. She verbalized understanding.  FYI[facility] is not accepting any new patient's until their whole staff has covid-19 vaccine.   Phone number to call for telephone visit: [phone]

## 2019-03-07 NOTE — Progress Notes (Signed)
[facility]  PATIENT: Taylor Hancock DOB: January 19, 1961  REFERRING CLINICIAN: Shirline Frees   HISTORY FROM: Patient REASON FOR VISIT: PPMS   HISTORICAL  CHIEF COMPLAINT:  No chief complaint on file.    [facility]  PATIENT: Taylor Hancock DOB: 09-May-1960  REFERRING CLINICIAN: Shirline Frees   HISTORY FROM: Patient REASON FOR VISIT: PPMS   HISTORICAL  CHIEF COMPLAINT:  No chief complaint on file.   HISTORY OF PRESENT ILLNESS:  Taylor Hancock is a 59 year old woman with MS diagnosed in 2005.     Update 03/07/2019: Virtual Visit via Video Note I connected with Taylor Hancock on 03/07/19 at  1:00 PM EST by a telephone application and verified that I am speaking with the correct person.  I discussed the limitations of evaluation and management by telemedicine and the availability of in person appointments. The patient expressed understanding and agreed to proceed.  History of Present Illness: She is having more trouble with leg strength.   She has had falls.  Twp falls in 2019 and 2020 caused left leg and foot fracture.  She has PPMS and is not on any DMT.   She uses her Rollator for short distances up to 15-20 feet but this exhausts her.   Due to Covid, she has had trouble getting help in her home.   She needs help to get in and out of a car but can get up in her home.  She ties to exercise with [employer].   We had tried to get a new scooter for her (old one does not hold charge) but she said the DME company told her it would not be covered.   She had Covid-19 (husband had first).   She is reluctant to do the vaccine as had issues with the flu vaccine (weakness leading to fall/injury).   Extensive conversation about mobility issues (essentially the same as last visit though)  She has urinary urgency.   She stopped Vesicare.   In general bladder is better  She has breast cancer followed by Dr. Lindi Adie   Observations/Objective: Alert  and oriented, good attention, strong voice.    Assessment and Plan: Multiple sclerosis, primary progressive (HCC)  Spastic diplegia (HCC)  Frequent falls  Spastic gait  Urinary frequency  Other fatigue  1.  She will remain off a DMT for her PPMS 2.   Home PT 3.   Advised to talk to insurance or DME company to find out why scooter not covered.  If she needs a wheelchaair evaluation will need face to face with me or PT.   4.   F/u in 4-5 months, sooner if new or worsening symptoms  Follow Up Instructions: I discussed the assessment and treatment plan with the patient. The patient was provided an opportunity to ask questions and all were answered. The patient agreed with the plan and demonstrated an understanding of the instructions.    The patient was advised to call back or seek an in-person evaluation if the symptoms worsen or if the condition fails to improve as anticipated.  I provided 35 minutes of non-face-to-face time during this encounter.   Update 08/11/2018: She has Primary progressive MS.  She is not on any DMT Her gait has progressively worsened and she is relying more on her scooter.    She is only able to go about 15 feet with the Rollator due to her weakness and poor endurance.  She has left >> right weakness.  She  has noted more issues in the last few months now having trouble getting legs in cars.   She has frequent falls and has broken her toes.    Her current scooter won't hold a charge long and the batteries are no longer made.   She was doing PT but with Covid it was stopped and she is doing much worse.     She has multiple impairments from her MS.  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   She uses a walker but still has multiple falls (4 falls yesterday and averages 1 a day).      She has noted more weakness over the last year.    She notes doing less exercise since her operations for breast cancer.   She is getting more chest dysesthesias  but with her breast cancer and GI issues etiology is not certain.   She denies numbness.   She has urinary retention/hesitancy and also urge incontinence.    Mobility issues: She is currently having difficulties doing activities of daily living including going from room to room (multiple falls) to use the bathroom, prepare meals and do other simple chores.    Due to the left arm symptoms, she cannot operate a self-propelled wheelchair.  In addition she has fatigue.  Therefore she needs a power vehicle.  We discussed a power scooter and power wheelchair.  She believes she can operate the power scooter in her home especially 1 with a smaller turning radius..    She would prefer a scooter over a wheelchair.    A scooter would allow her to perform her activities of daily living.     Other issues: She notes fatigue.  This has not responded to medications in the past.  She has depression.  She stopped Lexapro as she felt she did not tolerate it well..  We discussed seeing Behavioral Health due to her stress issues revolving around her multiple sclerosis and breast cancer. She is having more urinary problems.  She uses a bedside commode but wears Depends due to some incontinence   She has breast cancer and sees Dr. Lindi Adie.  Update 02/10/2018: She is doing about the same.   She has PPMS and is not on a DMT (the only medication approved for primary progressive MS is contraindicated in patients with active breast cancer).     She has multiple impairments from her MS.  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   She uses a walker but still has multiple falls (4 falls yesterday and averages 1 a day).      She has noted more weakness over the last year.    She notes doing less exercise since her operations for breast cancer.   She is getting more chest dysesthesias but with her breast cancer and GI issues etiology is not certain.   She denies numbness.   She has urinary retention/hesitancy  and also urge incontinence.    Mobility issues: She is currently having difficulties doing activities of daily living including going from room to room (multiple falls) to use the bathroom, prepare meals and do other simple chores.    Due to the left arm symptoms, she cannot operate a self-propelled wheelchair.  In addition she has fatigue.  Therefore she needs a power vehicle.  We discussed a power scooter and power wheelchair.  She believes she can operate the power scooter in her home especially 1  with a smaller turning radius..    She would prefer a scooter over a wheelchair.    A scooter would allow her to perform her activities of daily living.     Other issues: She notes fatigue.  This has not responded to medications in the past.  She has depression.  She stopped Lexapro as she felt she did not tolerate it well..  We discussed seeing Behavioral Health due to her stress issues revolving around her multiple sclerosis and breast cancer.  She also has breast cancer and had RadRx and Femara.  It is ER+/PR+/Her+.  The LN's were negative.   She sees Dr. Lindi Adie.   On the Femara or with radiation, she had a lot more myalgias.   She had not been on any MS DMT for many years.    She has a recent scan and some abnormality was found so they are planning on doing on a colonoscopy.     Update 08/10/2017: She has PPMS and has had progressively worsening gait due to left hemiplegia with leg > arm weakness.  Gait is poor and she has frequent falls.  Currently, she uses a walker but continues to have falls.    She is having more trouble with her ADL's   She falls frequently and fractured her toes last year with a fall.  Due  To her leg weakness and poor gait, she has trouble getting to the bathroom in a timely manner leading to incontinence (urinary and occasionally fecal).   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      She [place] has urinary urgency/frequency.   Vision is fine.    She reports fatigue on a daily basis.    She is noting some depression.   She has mild cognitive issues, unchanged this year.     To help her get from room to room and to do her household chores, she needs a power scooter.   She cannot walk well with a cane.   She has used a walker the past couple year but is falling and can't move well enough.   She cannot self propel a standard or lightweight wheelchair due to left arm symptoms and rapid fatigue.    Therefore, she needs a powered vehicle.   A scooter should allow her to accomplish her ADL's and to get form room to room to use the bathroom and do her simple household chores.     She also has breast cancer and had RadRx and Femara.  It is ER+/PR+/Her+.  The LN's were negative.   She sees Dr. Lindi Adie.   On the Femara or with radiation, she had a lot more myalgias.   She had not been on any MS DMT for many years.      Update 01/26/2017:   She has primary progressive MS.   As there are no great treatments for PPMS, she is off of any DMT.   Ocrelizumab has been discussed in the past but due to limited benefit and infectious risk she has decided not to start.    She is noting more weakness in her legs, left worse than right.  Has mild left arm weakness.  The right leg weakness has come on much more over the past couple years.    Gait is spastic and off balanced and she has multiple falls.    She is no longer able to take any steps without falling.   Even with her  walker, she is falling.   She is doing PT Advertising account executive center [facility]) and feels they have helped her some but her gait continues to  progressively worsen.   They have helped her make more adaptions to her house, especially in the bathroom.   She is noting more difficulty with her hands and typing is slower with more mistakes.   Handwriting is worse and her hand spasms with writing.   Grip strength is reduced.    She has more difficulty raising her hands overhead.    She is on clonazepam,  Zanaflex and Flexerill  Her fatigue continues and has worsened over the past year.  She also has had more cognitive issues with reduced executive function, focus and attention.    She often has trouble with verbal fluency.   Multi-tasking is more difficult.     She has some depression, helped a little by Lexapro.  Constipation is a problem at times.   Has also had a few episodes of fecal incontinence.  Urinary hesitancy is worse.     She also has urinary frequency an occasional incontinence.     From 08/19/2016: MS:.  She has a primary progressive MS.  There has been steady progression of her spasticity, weakness and cognitive fog.     Gait/strength:    Hr gait is worse and the left leg seems weaker and more spastic.   She has a left foot drop but not right.    She uses a Rollator walker now almost exclusively.   The left leg is more spastic and spasticity I often painful.  Clonazepam at bedtime helps sleep and spasticity some and she now takes 4 mg at night.     Tizanidine also helped..  Baclofen and daytime muscle relaxants have been poorly tolerated.  Due to risk of side effects, she did not want to try Ampyra in the past. Her left foot drop does better with a Walk-Aide Neurostimulator on the left.      Sensory:  She gets right > left trunk dysesthesia.  This is stable.   Both of her feet feel numb but not painful  Vision:  She has not had optic neuritis.   She notes reduced depth perception compared to the past. She does not feel safe driving at night anymore.  Bowel/Bladder:    Bladder function is stable with urinary frequency and urgency.     Rarely she has urge incontinence and this has happened a few times at work.   .   She has rare UTI.   She notes bowel incontinence at times.    Fatigue/sleep;  She has fatigue, worse as day goes on and with heat.   The fatigue is both mental and physical.      She falls asleep easily but may wake up some at night.   She sometimes naps during the day (and  if so, sleep is worse at night)  Mood:    She has some job and marital stress and has some depression.    She has some crying spells and can be irritable.   She reports she is not drinking much.       She is not currently on an antidepressant.  She was once on Lexapro and it helped a little bit.   We discussed restarting it (she has 10 mg pills at the house)  Cognitive:   She feels cognition is worse and she is more forgetful.    We discussed  the MRI does show some atrophy from the MS which may be playing a large role.    Also fatigue is interacting with her cognitive issues.  .    She notes that she is often forgetful, worse in the afternoons.    She also has difficulties with verbal fluency. She has some issues with in executive function and has more difficulty with planning and doing complicated tasks.       We discussed work as it is becoming harder for her to focus and she has more errors (cognitive and typographical).  She sometimes forgets what she is doing and needs to start over.   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     She has physical, fatigue and cognitive issues that are all making employment difficulty.    She feels she makes errors all day but this is worse after a few hours when the fatigue kicks in.     MS History:  She had a [place] sign in the 1990's a few times and she felt her neck was weaker at that time.  However, gait was not affected at that time.  She was diagnosed with multiple sclerosis in 2005 after presenting with several years of worsening gait. Initially, she was diagnosed with relapsing remitting MS and was placed on Betaseron. She had difficulty tolerating Betaseron but did continue to take it. Her first MRI after the diagnosis was unchanged from her previous one. A couple years later she transferred her care to me. After review of her time course of disease, it was apparent that she had a progressive MS and not a relapsing  form of MS. Therefore, the Betaseron was discontinued.    REVIEW OF SYSTEMS:  Constitutional: No fevers, chills, sweats, or change in appetite.  Notes fatigue.   Has insomnia and sleepiness Eyes: No visual changes, double vision.  Notes light sensitivity and eye pain Ear, nose and throat: No hearing loss, ear pain, nasal congestion, sore throat.    Cardiovascular: No chest pain, palpitations Respiratory:  No shortness of breath at rest or with exertion.   No wheezes GastrointestinaI: No nausea, vomiting, diarrhea, abdominal pain.  Occ fecal incontinence Genitourinary:  see above.    Musculoskeletal:  She has myalgias Integumentary: No rash, pruritus, skin lesions.   Edema in feet Neurological: as above Psychiatric: Notes depression and anxiety  Endocrine: No palpitations, diaphoresis, change in appetite, change in weigh or increased thirst Hematologic/Lymphatic:  No anemia, purpura, petechiae. Allergic/Immunologic: No itchy/runny eyes, nasal congestion, recent allergic reactions, rashes  ALLERGIES: Allergies  Allergen Reactions  . Letrozole Other (See Comments)    Suicidal ideation  . Levaquin [Levofloxacin In D5w]   . Ppa-Mma Express [Compleat] Nausea And Vomiting    Blood in vomit and back felt like it was breaking.   Marland Kitchen Zithromax [Azithromycin] Other (See Comments)    abd cramping  . Latex Itching  . Ultram [Tramadol Hcl] Anxiety    HOME MEDICATIONS: Outpatient Medications Prior to Visit  Medication Sig Dispense Refill  . calcium citrate-vitamin D (CITRACAL+D) 315-200 MG-UNIT tablet Take 1 tablet by mouth 2 (two) times daily.    . cholecalciferol (VITAMIN D) 1000 units tablet Take 1,000 Units by mouth daily. Pt reports 2000 UNITS daily, somedays up to 8000 UNITS.    Marland Kitchen clonazePAM (KLONOPIN) 1 MG tablet Take 4 pills po qHS (Patient taking differently: Take 4 mg by mouth at bedtime. Take 4 pills po qHS) 360  tablet 1  . cyclobenzaprine (FLEXERIL) 5 MG tablet TAKE 1 TO 2 TABLETS  BY MOUTH EVERY DAY AT BEDTIME (Patient taking differently: Take 5-10 mg by mouth at bedtime. ) 60 tablet 5  . oxyCODONE-acetaminophen (PERCOCET/ROXICET) 5-325 MG tablet Take 1 tablet by mouth twice daily as needed 15 tablet 0  . pantoprazole (PROTONIX) 40 MG tablet Take 40 mg by mouth daily.    . solifenacin (VESICARE) 5 MG tablet Take 1 tablet (5 mg total) by mouth daily. (Patient not taking: Reported on 12/13/2018) 30 tablet 11  . tiZANidine (ZANAFLEX) 4 MG tablet TAKE 2 TABLETS (8 MG TOTAL) BY MOUTH AT BEDTIME. 180 tablet 1   No facility-administered medications prior to visit.    PAST MEDICAL HISTORY: Past Medical History:  Diagnosis Date  . Anxiety   . Complication of anesthesia    patient was awake during colonoscopy as drugs were not effective-reports that propofol is an effective analgesic for her  . Concussion   . Depression   . Family history of breast cancer   . Family history of ovarian cancer   . Family history of pancreatic cancer   . Fibromyalgia   . History of radiation therapy 05/20/17- 06/17/17   Left Breast/ 40.05 Gy in 15 fractions, Left Breast Boost/ 10 Gy in 5 fractions.   . Movement disorder   . MS (multiple sclerosis) ([place])    primary progressive MS  . Multiple sclerosis ([place])   . Neuropathy   . Osteoporosis   . Vision abnormalities     PAST SURGICAL HISTORY: Past Surgical History:  Procedure Laterality Date  . BREAST LUMPECTOMY WITH RADIOACTIVE SEED AND SENTINEL LYMPH NODE BIOPSY Left 03/31/2017   Procedure: BREAST LUMPECTOMY WITH RADIOACTIVE SEED AND SENTINEL LYMPH NODE BIOPSY;  Surgeon: Stark Klein, MD;  Location: [facility];  Service: General;  Laterality: Left;  . FRACTURE SURGERY Left    hand  . I & D EXTREMITY  05/30/2011   Procedure: IRRIGATION AND DEBRIDEMENT EXTREMITY;  Surgeon: Tennis Must, MD;  Location: [facility];  Service: Orthopedics;;  Incision and drainage of open Proximal interphalangeal joint with closed reduction of Proximal interphalangeal   joint left long finger  . KNEE ARTHROSCOPY      FAMILY HISTORY: Family History  Problem Relation Age of Onset  . Hypertension Mother   . Breast cancer Mother 74  . Hypothyroidism Mother   . Hyperlipidemia Father   . Diabetes Father   . Pancreatic cancer Father 37  . Hypothyroidism Sister   . Ovarian cancer Maternal Aunt        dx in her 68s  . Parkinson's disease Maternal Uncle   . Stroke Maternal Uncle   . Breast cancer Maternal Grandmother 100  . Breast cancer Maternal Aunt        dx under 2  . Breast cancer Maternal Aunt        dx over 41s  . Breast cancer Maternal Aunt        dx >50  . Breast cancer Other   . Hypothyroidism Brother   . Breast cancer Cousin   . Breast cancer Sister     SOCIAL HISTORY:  Social History   Socioeconomic History  . Marital status: Married    Spouse name: Not on file  . Number of children: Not on file  . Years of education: Not on file  . Highest education level: Not on file  Occupational History  . Not on file  Tobacco Use  .  Smoking status: Former Smoker    Packs/day: 0.50    Types: Cigarettes    Quit date: 11/25/2012    Years since quitting: 6.2  . Smokeless tobacco: Never Used  Substance and Sexual Activity  . Alcohol use: Yes    Comment: 12 alcohol drinks weekly  . Drug use: No  . Sexual activity: Not on file  Other Topics Concern  . Not on file  Social History Narrative  . Not on file   Social Determinants of Health   Financial Resource Strain:   . Difficulty of Paying Living Expenses: Not on file  Food Insecurity:   . Worried About Charity fundraiser in the Last Year: Not on file  . Ran Out of Food in the Last Year: Not on file  Transportation Needs:   . Lack of Transportation (Medical): Not on file  . Lack of Transportation (Non-Medical): Not on file  Physical Activity:   . Days of Exercise per Week: Not on file  . Minutes of Exercise per Session: Not on file  Stress:   . Feeling of Stress : Not on file   Social Connections:   . Frequency of Communication with Friends and Family: Not on file  . Frequency of Social Gatherings with Friends and Family: Not on file  . Attends Religious Services: Not on file  . Active Member of Clubs or Organizations: Not on file  . Attends Archivist Meetings: Not on file  . Marital Status: Not on file  Intimate Partner Violence:   . Fear of Current or Ex-Partner: Not on file  . Emotionally Abused: Not on file  . Physically Abused: Not on file  . Sexually Abused: Not on file    Kelen Laura A. Felecia Shelling, MD, PhD [id number], [id number] PM Certified in Neurology, Clinical Neurophysiology, Sleep Medicine, Pain Medicine and Neuroimaging  [facility] [address] [place], [place] [address] [phone]'

## 2019-03-07 NOTE — Telephone Encounter (Signed)
Pt called stating she just fell and she is not able to get to her appt today but would like the RN to call her when she is available. Please advise.

## 2019-04-04 ENCOUNTER — Other Ambulatory Visit: Payer: Self-pay | Admitting: Neurology

## 2019-04-17 ENCOUNTER — Telehealth: Payer: Self-pay | Admitting: Neurology

## 2019-04-17 DIAGNOSIS — G801 Spastic diplegic cerebral palsy: Secondary | ICD-10-CM

## 2019-04-17 DIAGNOSIS — R5383 Other fatigue: Secondary | ICD-10-CM

## 2019-04-17 DIAGNOSIS — G35 Multiple sclerosis: Secondary | ICD-10-CM

## 2019-04-17 DIAGNOSIS — R296 Repeated falls: Secondary | ICD-10-CM

## 2019-04-17 DIAGNOSIS — R261 Paralytic gait: Secondary | ICD-10-CM

## 2019-04-17 NOTE — Telephone Encounter (Signed)
Dr. Felecia Shelling- did you want to place a referral to a different home health? [facility].  Were you needing to know her DME for a specific reason?

## 2019-04-17 NOTE — Telephone Encounter (Signed)
Pt has called to report that home health worker came, pt states that she only got a bath from the worker.  Pt states it is not working out with this company and she'd like to know if she can be referred to another agency.  Pt also states Dr Felecia Shelling wanted to know about her Durable Medical Equipment, pt states she uses [facility]. Please call pt

## 2019-04-17 NOTE — Telephone Encounter (Signed)
I would be happy to refer her to another home health agency if she is not happy with [facility].   If she has a name we can refer to them for otherwise whichever 1 you had the best success with  She was going to contact her DME company to see if she can qualify for a scooter.  Erik Obey is old and does not hold a charge anymore.  The last visits have been virtual and she may need an actual face-to-face with either [place] or PT for a scooter/wheelchair evaluation

## 2019-04-18 NOTE — Telephone Encounter (Signed)
Called and spoke with pt. She is not sure the name of most recent home health agency that came out, did not get business card or brochure. She expressed dissatisfaction with them, states women who came to help with her shower got water all over her new scooter. No longer working, now using old scooter that has to be constantly charged. She is wanting help with laundry and dishes as well.  Reminded her that [facility] previously stated they recommended 24 hour care/nursing facility but she previously declined this. [employer] previously told her they needed to get all employees vaccinated for covid-19 before sending someone out. She verified she has their phone number still. She will call them to see if they can come out to help her now. She is unable to use walker right now. Cannot walk more than 20 ft with it. She is looking into getting walk in bath tub. She already put a call into company that gave her new scooter to see about getting it serviced/fixed. Her husband is getting ready to retire and will be able to help her more around the house soon.  She had covid-19 around 11/2018. Having sx still post-covid.   Reminded her next f/u 07/11/19 at 4pm with Dr. Felecia Shelling. She wrote this down.

## 2019-04-18 NOTE — Addendum Note (Signed)
Addended by: Roberts Gaudy L on: 04/18/2019 10:14 AM   Modules accepted: Orders

## 2019-06-02 ENCOUNTER — Telehealth: Payer: Self-pay | Admitting: Neurology

## 2019-06-02 ENCOUNTER — Other Ambulatory Visit: Payer: Self-pay | Admitting: Neurology

## 2019-06-02 MED ORDER — CLONAZEPAM 1 MG PO TABS: ORAL_TABLET | ORAL | 0 refills | Status: DC

## 2019-06-02 NOTE — Telephone Encounter (Signed)
Patient requested refill through after-hours call center for her clonazepam.  1 month prescription for clonazepam sent to her pharmacy electronically.

## 2019-06-29 ENCOUNTER — Other Ambulatory Visit: Payer: Self-pay | Admitting: Neurology

## 2019-07-11 ENCOUNTER — Telehealth: Payer: Self-pay | Admitting: *Deleted

## 2019-07-11 ENCOUNTER — Telehealth (INDEPENDENT_AMBULATORY_CARE_PROVIDER_SITE_OTHER): Payer: Medicare Other | Admitting: Neurology

## 2019-07-11 ENCOUNTER — Encounter: Payer: Self-pay | Admitting: Neurology

## 2019-07-11 DIAGNOSIS — G801 Spastic diplegic cerebral palsy: Secondary | ICD-10-CM

## 2019-07-11 DIAGNOSIS — G35 Multiple sclerosis: Secondary | ICD-10-CM

## 2019-07-11 DIAGNOSIS — R350 Frequency of micturition: Secondary | ICD-10-CM | POA: Diagnosis not present

## 2019-07-11 DIAGNOSIS — R296 Repeated falls: Secondary | ICD-10-CM

## 2019-07-11 NOTE — Progress Notes (Signed)
[facility]  PATIENT: Taylor Hancock DOB: 04/03/1960  REFERRING CLINICIAN: Shirline Frees   HISTORY FROM: Patient REASON FOR VISIT: PPMS   HISTORICAL  CHIEF COMPLAINT:  Chief Complaint  Patient presents with  . Multiple Sclerosis    PPMS  not on DMT     [facility]  PATIENT: Taylor Hancock DOB: [birth year]  REFERRING CLINICIAN: Shirline Frees   HISTORY FROM: Patient REASON FOR VISIT: PPMS   HISTORICAL  CHIEF COMPLAINT:  Chief Complaint  Patient presents with  . Multiple Sclerosis    PPMS  not on DMT    HISTORY OF PRESENT ILLNESS:  Taylor Hancock is a 59 year old woman with PPMS diagnosed in 2005.     Update 03/07/2019: Virtual Visit via vidoe Note I connected with Taylor Hancock on 07/11/19 at  4:00 PM EDT by a video application and verified that I am speaking with the correct person.  I discussed the limitations of evaluation and management by telemedicine and the availability of in person appointments. The patient expressed understanding and agreed to proceed.  I saw her on video but she could not hear me so we converted to a telephone to complete the visit.  History of Present Illness: She had a bad fall and had 4 fractures in her left foot and ankle. She is better but not able to use her Rollator.   The fall was in her bathroom.   Her left leg is weaker than right.  For spasticity she takes clonazepam, flexeril and tizanidine with benefit.    She has home health services.   She is getting PT (Home) through [facility]. She is improving.    She is going to get a walk in bathtub.    She had Covid-19 last year and feels weaker since.  She has decided not to get the vaccine as she had a bad reaction to a flu shot  She has PPMS and is not on any DMT.     She has urinary urgency.   She stopped Vesicare.   In general bladder is better  She has breast cancer followed by Dr. Lindi Adie.  She has hypertension was 170/100 recently and  resumed her BP medications    Update 08/11/2018: She has Primary progressive MS.  She is not on any DMT Her gait has progressively worsened and she is relying more on her scooter.    She is only able to go about 15 feet with the Rollator due to her weakness and poor endurance.  She has left >> right weakness.  She has noted more issues in the last few months now having trouble getting legs in cars.   She has frequent falls and has broken her toes.    Her current scooter won't hold a charge long and the batteries are no longer made.   She was doing PT but with Covid it was stopped and she is doing much worse.     She has multiple impairments from her MS.  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   She uses a walker but still has multiple falls (4 falls yesterday and averages 1 a day).      She has noted more weakness over the last year.    She notes doing less exercise since her operations for breast cancer.   She is getting more chest dysesthesias but with her breast cancer and GI issues etiology is not certain.   She  denies numbness.   She has urinary retention/hesitancy and also urge incontinence.    Mobility issues: She is currently having difficulties doing activities of daily living including going from room to room (multiple falls) to use the bathroom, prepare meals and do other simple chores.    Due to the left arm symptoms, she cannot operate a self-propelled wheelchair.  In addition she has fatigue.  Therefore she needs a power vehicle.  We discussed a power scooter and power wheelchair.  She believes she can operate the power scooter in her home especially 1 with a smaller turning radius..    She would prefer a scooter over a wheelchair.    A scooter would allow her to perform her activities of daily living.     Other issues: She notes fatigue.  This has not responded to medications in the past.  She has depression.  She stopped Lexapro as she felt she did not  tolerate it well..  We discussed seeing Behavioral Health due to her stress issues revolving around her multiple sclerosis and breast cancer. She is having more urinary problems.  She uses a bedside commode but wears Depends due to some incontinence   She has breast cancer and sees Dr. Lindi Adie.  Update 02/10/2018: She is doing about the same.   She has PPMS and is not on a DMT (the only medication approved for primary progressive MS is contraindicated in patients with active breast cancer).     She has multiple impairments from her MS.  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   She uses a walker but still has multiple falls (4 falls yesterday and averages 1 a day).      She has noted more weakness over the last year.    She notes doing less exercise since her operations for breast cancer.   She is getting more chest dysesthesias but with her breast cancer and GI issues etiology is not certain.   She denies numbness.   She has urinary retention/hesitancy and also urge incontinence.    Mobility issues: She is currently having difficulties doing activities of daily living including going from room to room (multiple falls) to use the bathroom, prepare meals and do other simple chores.    Due to the left arm symptoms, she cannot operate a self-propelled wheelchair.  In addition she has fatigue.  Therefore she needs a power vehicle.  We discussed a power scooter and power wheelchair.  She believes she can operate the power scooter in her home especially 1 with a smaller turning radius..    She would prefer a scooter over a wheelchair.    A scooter would allow her to perform her activities of daily living.     Other issues: She notes fatigue.  This has not responded to medications in the past.  She has depression.  She stopped Lexapro as she felt she did not tolerate it well..  We discussed seeing Behavioral Health due to her stress issues revolving around her multiple sclerosis and  breast cancer.  She also has breast cancer and had RadRx and Femara.  It is ER+/PR+/Her+.  The LN's were negative.   She sees Dr. Lindi Adie.   On the Femara or with radiation, she had a lot more myalgias.   She had not been on any MS DMT for many years.    She has a recent scan and some abnormality was found so they are planning on doing  on a colonoscopy.     Update 08/10/2017: She has PPMS and has had progressively worsening gait due to left hemiplegia with leg > arm weakness.  Gait is poor and she has frequent falls.  Currently, she uses a walker but continues to have falls.    She is having more trouble with her ADL's   She falls frequently and fractured her toes last year with a fall.  Due  To her leg weakness and poor gait, she has trouble getting to the bathroom in a timely manner leading to incontinence (urinary and occasionally fecal).   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      She [place] has urinary urgency/frequency.   Vision is fine.   She reports fatigue on a daily basis.    She is noting some depression.   She has mild cognitive issues, unchanged this year.     To help her get from room to room and to do her household chores, she needs a power scooter.   She cannot walk well with a cane.   She has used a walker the past couple year but is falling and can't move well enough.   She cannot self propel a standard or lightweight wheelchair due to left arm symptoms and rapid fatigue.    Therefore, she needs a powered vehicle.   A scooter should allow her to accomplish her ADL's and to get form room to room to use the bathroom and do her simple household chores.     She also has breast cancer and had RadRx and Femara.  It is ER+/PR+/Her+.  The LN's were negative.   She sees Dr. Lindi Adie.   On the Femara or with radiation, she had a lot more myalgias.   She had not been on any MS DMT for many years.      Update 01/26/2017:   She has primary progressive MS.   As  there are no great treatments for PPMS, she is off of any DMT.   Ocrelizumab has been discussed in the past but due to limited benefit and infectious risk she has decided not to start.    She is noting more weakness in her legs, left worse than right.  Has mild left arm weakness.  The right leg weakness has come on much more over the past couple years.    Gait is spastic and off balanced and she has multiple falls.    She is no longer able to take any steps without falling.   Even with her walker, she is falling.   She is doing PT Advertising account executive center at [facility]) and feels they have helped her some but her gait continues to  progressively worsen.   They have helped her make more adaptions to her house, especially in the bathroom.   She is noting more difficulty with her hands and typing is slower with more mistakes.   Handwriting is worse and her hand spasms with writing.   Grip strength is reduced.    She has more difficulty raising her hands overhead.    She is on clonazepam, Zanaflex and Flexerill  Her fatigue continues and has worsened over the past year.  She also has had more cognitive issues with reduced executive function, focus and attention.    She often has trouble with verbal fluency.   Multi-tasking is more difficult.     She has some depression, helped a little by Lexapro.  Constipation is a problem at times.   Has also had a few episodes of fecal incontinence.  Urinary hesitancy is worse.     She also has urinary frequency an occasional incontinence.     From 08/19/2016: MS:.  She has a primary progressive MS.  There has been steady progression of her spasticity, weakness and cognitive fog.     Gait/strength:    Hr gait is worse and the left leg seems weaker and more spastic.   She has a left foot drop but not right.    She uses a Rollator walker now almost exclusively.   The left leg is more spastic and spasticity I often painful.  Clonazepam at bedtime helps sleep and spasticity  some and she now takes 4 mg at night.     Tizanidine also helped..  Baclofen and daytime muscle relaxants have been poorly tolerated.  Due to risk of side effects, she did not want to try Ampyra in the past. Her left foot drop does better with a Walk-Aide Neurostimulator on the left.      Sensory:  She gets right > left trunk dysesthesia.  This is stable.   Both of her feet feel numb but not painful  Vision:  She has not had optic neuritis.   She notes reduced depth perception compared to the past. She does not feel safe driving at night anymore.  Bowel/Bladder:    Bladder function is stable with urinary frequency and urgency.     Rarely she has urge incontinence and this has happened a few times at work.   .   She has rare UTI.   She notes bowel incontinence at times.    Fatigue/sleep;  She has fatigue, worse as day goes on and with heat.   The fatigue is both mental and physical.      She falls asleep easily but may wake up some at night.   She sometimes naps during the day (and if so, sleep is worse at night)  Mood:    She has some job and marital stress and has some depression.    She has some crying spells and can be irritable.   She reports she is not drinking much.       She is not currently on an antidepressant.  She was once on Lexapro and it helped a little bit.   We discussed restarting it (she has 10 mg pills at the house)  Cognitive:   She feels cognition is worse and she is more forgetful.    We discussed the MRI does show some atrophy from the MS which may be playing a large role.    Also fatigue is interacting with her cognitive issues.  .    She notes that she is often forgetful, worse in the afternoons.    She also has difficulties with verbal fluency. She has some issues with in executive function and has more difficulty with planning and doing complicated tasks.       We discussed work as it is becoming harder for her to focus and she has more errors (cognitive and typographical).   She sometimes forgets what she is doing and needs to start over.   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     She has physical, fatigue and cognitive issues that are all making employment difficulty.    She feels she makes errors all day but this is worse  after a few hours when the fatigue kicks in.     MS History:  She had a [place] sign in the 1990's a few times and she felt her neck was weaker at that time.  However, gait was not affected at that time.  She was diagnosed with multiple sclerosis in 2005 after presenting with several years of worsening gait. Initially, she was diagnosed with relapsing remitting MS and was placed on Betaseron. She had difficulty tolerating Betaseron but did continue to take it. Her first MRI after the diagnosis was unchanged from her previous one. A couple years later she transferred her care to me. After review of her time course of disease, it was apparent that she had a progressive MS and not a relapsing form of MS. Therefore, the Betaseron was discontinued.    REVIEW OF SYSTEMS:  Constitutional: No fevers, chills, sweats, or change in appetite.  Notes fatigue.   Has insomnia and sleepiness Eyes: No visual changes, double vision.  Notes light sensitivity and eye pain Ear, nose and throat: No hearing loss, ear pain, nasal congestion, sore throat.    Cardiovascular: No chest pain, palpitations Respiratory:  No shortness of breath at rest or with exertion.   No wheezes GastrointestinaI: No nausea, vomiting, diarrhea, abdominal pain.  Occ fecal incontinence Genitourinary:  see above.    Musculoskeletal:  She has myalgias Integumentary: No rash, pruritus, skin lesions.   Edema in feet Neurological: as above Psychiatric: Notes depression and anxiety  Endocrine: No palpitations, diaphoresis, change in appetite, change in weigh or increased thirst Hematologic/Lymphatic:  No anemia, purpura, petechiae. Allergic/Immunologic:  No itchy/runny eyes, nasal congestion, recent allergic reactions, rashes  ALLERGIES: Allergies  Allergen Reactions  . Letrozole Other (See Comments)    Suicidal ideation  . Levaquin [Levofloxacin In D5w]   . Ppa-Mma Express [Compleat] Nausea And Vomiting    Blood in vomit and back felt like it was breaking.   Marland Kitchen Zithromax [Azithromycin] Other (See Comments)    abd cramping  . Latex Itching  . Ultram [Tramadol Hcl] Anxiety    HOME MEDICATIONS: Outpatient Medications Prior to Visit  Medication Sig Dispense Refill  . clonazePAM (KLONOPIN) 1 MG tablet TAKE 4 PILLS BY MOUTH AT BEDTIME 360 tablet 1  . cyclobenzaprine (FLEXERIL) 5 MG tablet TAKE 1 TO 2 TABLETS BY MOUTH EVERY DAY AT BEDTIME 60 tablet 5  . hydrochlorothiazide (HYDRODIURIL) 12.5 MG tablet Take 12.5 mg by mouth daily.    . pantoprazole (PROTONIX) 40 MG tablet Take 40 mg by mouth daily.    Marland Kitchen tiZANidine (ZANAFLEX) 4 MG tablet TAKE 2 TABLETS (8 MG TOTAL) BY MOUTH AT BEDTIME. 180 tablet 1  . triamcinolone cream (KENALOG) 0.1 % Apply 1 application topically 2 (two) times daily.     No facility-administered medications prior to visit.    PAST MEDICAL HISTORY: Past Medical History:  Diagnosis Date  . Anxiety   . Complication of anesthesia    patient was awake during colonoscopy as drugs were not effective-reports that propofol is an effective analgesic for her  . Concussion   . Depression   . Family history of breast cancer   . Family history of ovarian cancer   . Family history of pancreatic cancer   . Fibromyalgia   . History of radiation therapy 05/20/17- 06/17/17   Left Breast/ 40.05 Gy in 15 fractions, Left Breast Boost/ 10 Gy in 5 fractions.   . Movement disorder   . MS (multiple sclerosis) ([place])    primary  progressive MS  . Multiple sclerosis ([place])   . Neuropathy   . Osteoporosis   . Vision abnormalities     PAST SURGICAL HISTORY: Past Surgical History:  Procedure Laterality Date  . BREAST LUMPECTOMY WITH  RADIOACTIVE SEED AND SENTINEL LYMPH NODE BIOPSY Left 03/31/2017   Procedure: BREAST LUMPECTOMY WITH RADIOACTIVE SEED AND SENTINEL LYMPH NODE BIOPSY;  Surgeon: Stark Klein, MD;  Location: [facility];  Service: General;  Laterality: Left;  . FRACTURE SURGERY Left    hand  . I & D EXTREMITY  05/30/2011   Procedure: IRRIGATION AND DEBRIDEMENT EXTREMITY;  Surgeon: Tennis Must, MD;  Location: [facility];  Service: Orthopedics;;  Incision and drainage of open Proximal interphalangeal joint with closed reduction of Proximal interphalangeal  joint left long finger  . KNEE ARTHROSCOPY      FAMILY HISTORY: Family History  Problem Relation Age of Onset  . Hypertension Mother   . Breast cancer Mother 12  . Hypothyroidism Mother   . Hyperlipidemia Father   . Diabetes Father   . Pancreatic cancer Father 37  . Hypothyroidism Sister   . Ovarian cancer Maternal Aunt        dx in her 12s  . Parkinson's disease Maternal Uncle   . Stroke Maternal Uncle   . Breast cancer Maternal Grandmother 100  . Breast cancer Maternal Aunt        dx under 6  . Breast cancer Maternal Aunt        dx over 48s  . Breast cancer Maternal Aunt        dx >50  . Breast cancer Other   . Hypothyroidism Brother   . Breast cancer Cousin   . Breast cancer Sister     SOCIAL HISTORY:  Social History   Socioeconomic History  . Marital status: Married    Spouse name: Not on file  . Number of children: Not on file  . Years of education: Not on file  . Highest education level: Not on file  Occupational History  . Not on file  Tobacco Use  . Smoking status: Former Smoker    Packs/day: 0.50    Types: Cigarettes    Quit date: 11/25/2012    Years since quitting: 6.6  . Smokeless tobacco: Never Used  Vaping Use  . Vaping Use: Never used  Substance and Sexual Activity  . Alcohol use: Yes    Comment: 12 alcohol drinks weekly  . Drug use: No  . Sexual activity: Not on file  Other Topics Concern  . Not on file  Social  History Narrative  . Not on file   Social Determinants of Health   Financial Resource Strain:   . Difficulty of Paying Living Expenses:   Food Insecurity:   . Worried About Charity fundraiser in the Last Year:   . Arboriculturist in the Last Year:   Transportation Needs:   . Film/video editor (Medical):   Marland Kitchen Lack of Transportation (Non-Medical):   Physical Activity:   . Days of Exercise per Week:   . Minutes of Exercise per Session:   Stress:   . Feeling of Stress :   Social Connections:   . Frequency of Communication with Friends and Family:   . Frequency of Social Gatherings with Friends and Family:   . Attends Religious Services:   . Active Member of Clubs or Organizations:   . Attends Archivist Meetings:   Marland Kitchen Marital Status:  Intimate Partner Violence:   . Fear of Current or Ex-Partner:   . Emotionally Abused:   Marland Kitchen Physically Abused:   . Sexually Abused:     Observations/Objective: Alert and oriented, good attention, strong voice.  Head is [place]/AT.  Sclera are anicteric.  Assessment and Plan: Multiple sclerosis, primary progressive (HCC)  Spastic diplegia (HCC)  Frequent falls  Urinary frequency  1.  She will remain off a DMT for her PPMS 2.   She will continue home physical therapy. 3.   Try to stay active and strengthen up the legs. 4.   F/u in 6 months, sooner if new or worsening symptoms  Follow Up Instructions: I discussed the assessment and treatment plan with the patient. The patient was provided an opportunity to ask questions and all were answered. The patient agreed with the plan and demonstrated an understanding of the instructions.    The patient was advised to call back or seek an in-person evaluation if the symptoms worsen or if the condition fails to improve as anticipated.  30-minute office visit with the majority of the time spent face-to-face for history and physical, discussion/counseling and decision-making.  Additional time  with record review and documentation. Zarif Rathje A. Felecia Shelling, MD, PhD 9/69/2493, 2:41 PM Certified in Neurology, Clinical Neurophysiology, Sleep Medicine, Pain Medicine and Neuroimaging  [facility] [address], [place], [place] [address] [phone]

## 2019-07-11 NOTE — Telephone Encounter (Signed)
Called pt. Reminded her about mychart VV today at 4:00 pm. Advised her to log on about 10-15 min prior. Updated med list, pharmacy, allergy list.

## 2019-08-16 ENCOUNTER — Telehealth: Payer: Self-pay

## 2019-08-16 NOTE — Telephone Encounter (Addendum)
Spoke with Dr. Felecia Shelling, ok to work in for [place] VV on 08/21/19 at [facility] placed on hold.  I called and spoke with pt husband. Scheduled VV. Advised they should log on at least 15 min prior. He would like me to also call Waunita Schooner with PT to let him know. He had a change in his phone #. I updated this. States her meds have not changed since last visit. OT was there today with Waunita Schooner from PT. PT will be there again on Friday Saralyn Pilar).  Husband states pt insurance has changed and would like to update this. I transferred   I called Waunita Schooner back. Informed him of VV. He is going to send paperwork on what Dr. Felecia Shelling needs to include in OV note. Asked he fax this to [phone]. Height/weight needs to be included. Asked that PT do height/weight. He will do this and fax over the results for Dr. Felecia Shelling. Last documented height/weight for [place] was in 2019.

## 2019-08-16 NOTE — Telephone Encounter (Signed)
Called Taylor Hancock back to ask if VV from 07/11/19 would suffice. He states this would not work because Medicare requires F2F visit with doctor to be after PT evaluation. Pt can do a virtual visit as long as there is audio and video. Provider must document this in the note. Advised I will speak with MD to see what can be worked out. He verbalized understanding.

## 2019-08-16 NOTE — Telephone Encounter (Signed)
Waunita Schooner, PT with [facility], says that patient needs to have a face to face with the doctor following their PT eval which was today. Patient prefers to do a virtual visit due to her mobility issues.   Waunita Schooner can be reached at [phone] if you have any questions for him, or he says to just reach out to the patient to schedule.

## 2019-08-21 ENCOUNTER — Telehealth: Payer: Self-pay | Admitting: Neurology

## 2019-08-21 ENCOUNTER — Encounter: Payer: Self-pay | Admitting: Neurology

## 2019-08-21 ENCOUNTER — Telehealth (INDEPENDENT_AMBULATORY_CARE_PROVIDER_SITE_OTHER): Payer: Medicare Other | Admitting: Neurology

## 2019-08-21 DIAGNOSIS — G801 Spastic diplegic cerebral palsy: Secondary | ICD-10-CM

## 2019-08-21 DIAGNOSIS — G35 Multiple sclerosis: Secondary | ICD-10-CM

## 2019-08-21 DIAGNOSIS — R296 Repeated falls: Secondary | ICD-10-CM

## 2019-08-21 DIAGNOSIS — R5383 Other fatigue: Secondary | ICD-10-CM | POA: Diagnosis not present

## 2019-08-21 NOTE — Telephone Encounter (Signed)
Faxed back completed/signed forms (pages 18-21 per MD request) for [employer] to [place] at [phone]. Included notes from today. Received fax confirmation.

## 2019-08-21 NOTE — Telephone Encounter (Signed)
Pt gave the wrong company for power wheelchair this morning at Video visit. Would like  to use [employer] Casimiro Needle) Phone number [phone]

## 2019-08-21 NOTE — Progress Notes (Addendum)
[facility]  PATIENT: Taylor Hancock DOB: 29-Jan-1960  REFERRING CLINICIAN: Shirline Frees   HISTORY FROM: Patient REASON FOR VISIT: PPMS   HISTORICAL  CHIEF COMPLAINT:  No chief complaint on file.   HISTORY OF PRESENT ILLNESS:  Taylor Hancock is a 59 year old woman with primary progressive multiple sclerosis diagnosed in 2005.     Update 08/21/2019: Virtual Visit via vidoe Note I connected with [name] on 08/21/19 at  9:00 AM EDT by a video and audio application and verified that I am speaking with the correct person.  I discussed the limitations of evaluation and management by telemedicine and the availability of in person appointments. The patient expressed understanding and agreed to proceed.  I saw her on video but she could not hear me so we converted to a telephone to complete the visit.  The patient connected while at home and the provider was at the office.  History of Present Illness: This is a mobility examination enabled by video and audio application MS History:    She has Primary progressive MS.  She is not on any DMT due to her degree of disability and lack of options for PPMS.   Her gait has progressively worsened and now she is chair bound.   She has multiple impairments from her MS.  Her main problem is progressively worsening gait and left leg > arm spastic hemiplegia. She also has right leg weakness though right arm fairly strong.    She denies numbness.   She has urinary retention/hesitancy and also urge incontinence.  She has fatigue daily.  Current Mobility:  She was able to use a walker some in 2020 but now she is chair bound.  She still has multiple falls when she transfers.  She broke her left foot earlier this year due to a fall.     She has noted more weakness over the last year.    She currently is using a scooter but it is insufficient to meet her mobility needs  Mobility issues: She is currently having difficulties  performing activities of daily living including going from room to room to use the bathroom, prepare meals, dressing and do other simple chores.   Due to weakness, she can not use a cane or walker.   Due to the left arm weakness and her fatigue, she cannot operate a manual wheelchair. A scooter will not meet her mobility needs to do her ADL's due to poor turning radius limiting travel in the house, tiller being harder to operate, difficulty transferring on/off.  Therefore, she needs a power wheelchair.      Other mobility issues:  She has the physical and mental capacity to use a power wheelchair.  She is able and motivated to use a power wheelchair.   Controls should be on the right.    Other issues: She notes fatigue.  She is having more urinary problems.  She uses a bedside commode but wears Depends due to some incontinence .  She has breast cancer followed by Dr. Lindi Adie.    MS History:  She had a Lhermite sign in the 1990's a few times and she felt her neck was weaker at that time.  However, gait was not affected at that time.  She was diagnosed with multiple sclerosis in 2005 after presenting with several years of worsening gait. Initially, she was diagnosed with relapsing remitting MS and was placed on Betaseron. She had difficulty tolerating Betaseron but did continue to  take it. Her first MRI after the diagnosis was unchanged from her previous one. A couple years later she transferred her care to me. After review of her time course of disease, it was apparent that she had a progressive MS and not a relapsing form of MS. Therefore, the Betaseron was discontinued. Due to extent of disability, Ocrelizumab was not used.  She remains off a DMT.     REVIEW OF SYSTEMS:  Constitutional: No fevers, chills, sweats, or change in appetite.  Notes fatigue.   Has insomnia and sleepiness Eyes: No visual changes, double vision.  Notes light sensitivity and eye pain Ear, nose and throat: No hearing loss, ear  pain, nasal congestion, sore throat.    Cardiovascular: No chest pain, palpitations Respiratory:  No shortness of breath at rest or with exertion.   No wheezes GastrointestinaI: No nausea, vomiting, diarrhea, abdominal pain.  Occ fecal incontinence Genitourinary:  see above.    Musculoskeletal:  She has myalgias Integumentary: No rash, pruritus, skin lesions.   Edema in feet Neurological: as above Psychiatric: Notes depression and anxiety  Endocrine: No palpitations, diaphoresis, change in appetite, change in weigh or increased thirst Hematologic/Lymphatic:  No anemia, purpura, petechiae. Allergic/Immunologic: No itchy/runny eyes, nasal congestion, recent allergic reactions, rashes  ALLERGIES: Allergies  Allergen Reactions  . Letrozole Other (See Comments)    Suicidal ideation  . Levaquin [Levofloxacin In D5w]   . Ppa-Mma Express [Compleat] Nausea And Vomiting    Blood in vomit and back felt like it was breaking.   Marland Kitchen Zithromax [Azithromycin] Other (See Comments)    abd cramping  . Latex Itching  . Ultram [Tramadol Hcl] Anxiety    HOME MEDICATIONS: Outpatient Medications Prior to Visit  Medication Sig Dispense Refill  . clonazePAM (KLONOPIN) 1 MG tablet TAKE 4 PILLS BY MOUTH AT BEDTIME 360 tablet 1  . cyclobenzaprine (FLEXERIL) 5 MG tablet TAKE 1 TO 2 TABLETS BY MOUTH EVERY DAY AT BEDTIME 60 tablet 5  . hydrochlorothiazide (HYDRODIURIL) 12.5 MG tablet Take 12.5 mg by mouth daily.    . pantoprazole (PROTONIX) 40 MG tablet Take 40 mg by mouth daily.    Marland Kitchen tiZANidine (ZANAFLEX) 4 MG tablet TAKE 2 TABLETS (8 MG TOTAL) BY MOUTH AT BEDTIME. 180 tablet 1  . triamcinolone cream (KENALOG) 0.1 % Apply 1 application topically 2 (two) times daily.     No facility-administered medications prior to visit.    PAST MEDICAL HISTORY: Past Medical History:  Diagnosis Date  . Anxiety   . Complication of anesthesia    patient was awake during colonoscopy as drugs were not effective-reports that  propofol is an effective analgesic for her  . Concussion   . Depression   . Family history of breast cancer   . Family history of ovarian cancer   . Family history of pancreatic cancer   . Fibromyalgia   . History of radiation therapy 05/20/17- 06/17/17   Left Breast/ 40.05 Gy in 15 fractions, Left Breast Boost/ 10 Gy in 5 fractions.   . Movement disorder   . MS (multiple sclerosis) ([place])    primary progressive MS  . Multiple sclerosis ([place])   . Neuropathy   . Osteoporosis   . Vision abnormalities     PAST SURGICAL HISTORY: Past Surgical History:  Procedure Laterality Date  . BREAST LUMPECTOMY WITH RADIOACTIVE SEED AND SENTINEL LYMPH NODE BIOPSY Left 03/31/2017   Procedure: BREAST LUMPECTOMY WITH RADIOACTIVE SEED AND SENTINEL LYMPH NODE BIOPSY;  Surgeon: Stark Klein, MD;  Location:  [facility];  Service: General;  Laterality: Left;  . FRACTURE SURGERY Left    hand  . I & D EXTREMITY  05/30/2011   Procedure: IRRIGATION AND DEBRIDEMENT EXTREMITY;  Surgeon: Tennis Must, MD;  Location: [facility];  Service: Orthopedics;;  Incision and drainage of open Proximal interphalangeal joint with closed reduction of Proximal interphalangeal  joint left long finger  . KNEE ARTHROSCOPY      FAMILY HISTORY: Family History  Problem Relation Age of Onset  . Hypertension Mother   . Breast cancer Mother 31  . Hypothyroidism Mother   . Hyperlipidemia Father   . Diabetes Father   . Pancreatic cancer Father 48  . Hypothyroidism Sister   . Ovarian cancer Maternal Aunt        dx in her 29s  . Parkinson's disease Maternal Uncle   . Stroke Maternal Uncle   . Breast cancer Maternal Grandmother 100  . Breast cancer Maternal Aunt        dx under 59  . Breast cancer Maternal Aunt        dx over 9s  . Breast cancer Maternal Aunt        dx >50  . Breast cancer Other   . Hypothyroidism Brother   . Breast cancer Cousin   . Breast cancer Sister         Observations/Objective:  Height  64 inches    Weight 130 pounds   BP 124/84   Pulse 103  SaO2 94%   Temp 98.7  She is a well-developed well-nourished woman in no acute distress.  The head is normocephalic and atraumatic.  Sclera are anicteric.  Visible skin appears normal.  The neck has a good range of motion.  Pharynx and tongue have normal appearance.  She is alert and fully oriented with fluent speech and good attention, knowledge and memory.  Extraocular muscles are intact.  Facial strength is normal.  Palatal elevation and tongue protrusion are midline.  She appears to have normal strength in the arms.  She has reduced left arm strength.  Unable to lift legs, weaker on left.   See details from PT evaluation  Assessment and Plan: Multiple sclerosis, primary progressive (HCC)  Spastic diplegia (HCC)  Frequent falls  Urinary frequency  1.   She needs a power wheelchair.   I agree with the wheelchair evaluation from 08/16/19. Paperwork will be completed  2.   Try to stay active and exercise as tolerated. 3.   F/u in 6 months, sooner if new or worsening symptoms  Follow Up Instructions: I discussed the assessment and treatment plan with the patient. The patient was provided an opportunity to ask questions and all were answered. The patient agreed with the plan and demonstrated an understanding of the instructions.    The patient was advised to call back or seek an in-person evaluation if the symptoms worsen or if the condition fails to improve as anticipated.  50 - minute office visit with the majority of the time spent face-to-face for history and physical, discussion/counseling and decision-making.  Additional time with record review and documentation (for note and for wheelchair).  Missael Ferrari A. Felecia Shelling, MD, PhD 03/23/2874, 8:11 AM Certified in Neurology, Clinical Neurophysiology, Sleep Medicine, Pain Medicine and Neuroimaging  [facility] [address], [place], [place] [address] [phone]

## 2019-08-21 NOTE — Telephone Encounter (Signed)
Noted, this is who we have been in correspondence with. Orders and OV note from today were faxed to them. See previous phone note.

## 2019-08-21 NOTE — Telephone Encounter (Signed)
Called Dave back. Advised we have not received paperwork yet from [employer]. He states he tried calling her but could not get a hold of her at the office. If he cant reach her today, he said he will hand deliver it tomorrow. He said documentation can be done after the fact, just have to make sure it has todays date on documenation. He apologized for the inconvienance. I informed Dr. Felecia Shelling.   Per Dr. Felecia Shelling, he was able to connect with pt via [email or web address].

## 2019-08-21 NOTE — Telephone Encounter (Signed)
I called Waunita Schooner back at [employer]. He spoke with their office this morning Sherlynn Stalls) and she was supposed to be faxing the paperwork. Advised we have not received anything. Asked he re-fax form to [phone]. He will send message to [facility].  Pt height: 5'5", weight: 130lb

## 2019-08-21 NOTE — Telephone Encounter (Signed)
Received fax from Sherlynn Stalls, gave to MD. Penni Homans back to let him know we received paperwork. I transferred call to MD to discuss what is and is not needed for documentation.

## 2019-09-19 ENCOUNTER — Telehealth: Payer: Self-pay | Admitting: Neurology

## 2019-09-19 NOTE — Telephone Encounter (Signed)
Pt called powered wheelchair coming tomorrow at 1 pm from [employer]. Pt would like a call from the nurse on what Pt needs for her wheelchair.

## 2019-09-19 NOTE — Telephone Encounter (Signed)
Called pt back to further discuss.  Nothing further needed from our office. Pt getting power wheelchair tomorrow from [employer]. Advised her to contact us back if she needs anything in the future. She verbalized understanding.

## 2019-10-01 ENCOUNTER — Other Ambulatory Visit: Payer: Self-pay | Admitting: Neurology

## 2019-10-25 ENCOUNTER — Telehealth: Payer: Self-pay | Admitting: Neurology

## 2019-10-25 NOTE — Telephone Encounter (Signed)
Pt called, wrist to my nails are hurting. Last night took 2 tablets of tiZANidine (ZANAFLEX) 4 MG tablet. Had a fall last night, called EMS, was not injured.Could you call in something for the pain? Would like a call from the nurse.

## 2019-10-25 NOTE — Telephone Encounter (Signed)
She can increase the tizanidine to 4 pills a day (1 in the morning, 1 later in the day and 2 at bedtime)

## 2019-10-25 NOTE — Telephone Encounter (Signed)
Called pt back to further discuss. She did have a fall yesterday but did not have any injuries. Pain in hands unrelated to this fall. This pain started 1-1.5 wks ago. Having bad cramping. She took tizanidine (2 tabs) po qhs last night. Did not take cyclobenzaprine d/t taking this. Confirmed other meds on list. She does not want pain meds but wondering if there is something else she can take to help with pain/cramping. She is not sleeping well because of this. Advised I will send message to Dr. Felecia Shelling and call her back tomorrow at the latest with recommendation.  FYI-she did get a walk in bath tub

## 2019-10-26 MED ORDER — TIZANIDINE HCL 4 MG PO TABS: ORAL_TABLET | ORAL | 1 refills | Status: DC

## 2019-10-26 NOTE — Telephone Encounter (Signed)
Called and spoke with pt. Relayed Dr. Garth Bigness recommendations. She is agreeable to this plan. [facility]. She will call back if she is not able to tolerate increased dose or if sx do not improve.

## 2019-12-29 ENCOUNTER — Other Ambulatory Visit: Payer: Self-pay | Admitting: Neurology

## 2019-12-29 ENCOUNTER — Telehealth: Payer: Self-pay | Admitting: Neurology

## 2019-12-29 MED ORDER — CLONAZEPAM 1 MG PO TABS: ORAL_TABLET | ORAL | 1 refills | Status: DC

## 2019-12-29 NOTE — Telephone Encounter (Signed)
Pt request refill clonazePAM (KLONOPIN) 1 MG tablet at [id number]

## 2020-01-01 NOTE — Telephone Encounter (Signed)
Reviewed pt chart. Dr. Felecia Shelling sent refill on 12/29/19 #360 with 1 refill to pharmacy.

## 2020-01-03 ENCOUNTER — Encounter: Payer: Self-pay | Admitting: Neurology

## 2020-01-03 ENCOUNTER — Telehealth: Payer: Self-pay | Admitting: *Deleted

## 2020-01-03 ENCOUNTER — Telehealth (INDEPENDENT_AMBULATORY_CARE_PROVIDER_SITE_OTHER): Payer: Medicare Other | Admitting: Neurology

## 2020-01-03 DIAGNOSIS — R5383 Other fatigue: Secondary | ICD-10-CM

## 2020-01-03 DIAGNOSIS — G35 Multiple sclerosis: Secondary | ICD-10-CM

## 2020-01-03 DIAGNOSIS — R261 Paralytic gait: Secondary | ICD-10-CM

## 2020-01-03 DIAGNOSIS — R296 Repeated falls: Secondary | ICD-10-CM | POA: Diagnosis not present

## 2020-01-03 DIAGNOSIS — G801 Spastic diplegic cerebral palsy: Secondary | ICD-10-CM

## 2020-01-03 DIAGNOSIS — F09 Unspecified mental disorder due to known physiological condition: Secondary | ICD-10-CM

## 2020-01-03 NOTE — Telephone Encounter (Signed)
Called pt. Walked her through on how to start mychart VV. She was able to start visit successfully.

## 2020-01-03 NOTE — Progress Notes (Signed)
[facility]  PATIENT: Taylor Hancock DOB: 1960-04-08  REFERRING CLINICIAN: Shirline Frees   HISTORY FROM: Patient REASON FOR VISIT: PPMS   HISTORICAL  CHIEF COMPLAINT:  Chief Complaint  Patient presents with  . Multiple Sclerosis    HISTORY OF PRESENT ILLNESS:  Taylor Hancock is a 59 year old woman with primary progressive multiple sclerosis diagnosed in 2005.     Update 08/21/2019: Virtual Visit via vidoe Note I connected with Taylor Hancock on 01/03/20 at  2:30 PM EST by a video and audio application and verified that I am speaking with the correct person.  I discussed the limitations of evaluation and management by telemedicine and the availability of in person appointments. The patient expressed understanding and agreed to proceed.  I saw her on video but she could not hear me so we converted to a telephone to complete the visit.  The patient connected while at home and the provider was at the office.  History of Present Illness: She had a bad fall last year with fractures in her left foot and ankle. She has done PT but it was reduced to just 1 session a week.  She can take about 20 steps with her Rollator (was much better before fracture).  She has a power wheelchair now for indoors.  She also has a scooter for outside    Her left leg is weaker than right.  For spasticity she takes clonazepam, flexeril and tizanidine with benefit.    She has home health services.   She is getting PT (Home) through [facility].  She notes fatigue.  She uses a bedside commode but wears Depends due to some incontinence .  She has urinary urgency.   She stopped Vesicare.   In general bladder is better  She has PPMS and is not on any DMT.     She has breast cancer followed by Dr. Lindi Adie.  She has hypertension was 170/100 recently and resumed her BP medications  She was recently diagnosed with DM (glucose was 167 and HgbA1c was 6.9).  After a 40 pound weight loss, glucose improved  and she is not on any medications.  She had Covid-19 in 2020 and feels much more tired since.  She has decided not to get the vaccine as she had a bad reaction to a flu shot.      MS History:  She had a Lhermite sign in the 1990's a few times and she felt her neck was weaker at that time.  However, gait was not affected at that time.  She was diagnosed with multiple sclerosis in 2005 after presenting with several years of worsening gait. Initially, she was diagnosed with relapsing remitting MS and was placed on Betaseron. She had difficulty tolerating Betaseron but did continue to take it. Her first MRI after the diagnosis was unchanged from her previous one. A couple years later she transferred her care to me. After review of her time course of disease, it was apparent that she had a progressive MS and not a relapsing form of MS. Therefore, the Betaseron was discontinued. Due to extent of disability, Ocrelizumab was not used.  She remains off a DMT.    IMAGING MRI brain 12/13/2018 showed Moderate to severe atrophy, with progression since 2017.  White matter changes in the brain are stable since 2017. Noacute intracranial abnormality  MRI cervical spine 12/13/2018 showed stable spinal cord foci at C2-3, C4, and C5 similar to the prior study. No enhancing lesions.  REVIEW OF SYSTEMS:  Constitutional: No fevers, chills, sweats, or change in appetite.  Notes fatigue.   Has insomnia and sleepiness Eyes: No visual changes, double vision.  Notes light sensitivity and eye pain Ear, nose and throat: No hearing loss, ear pain, nasal congestion, sore throat.    Cardiovascular: No chest pain, palpitations Respiratory:  No shortness of breath at rest or with exertion.   No wheezes GastrointestinaI: No nausea, vomiting, diarrhea, abdominal pain.  Occ fecal incontinence Genitourinary:  see above.    Musculoskeletal:  She has myalgias Integumentary: No rash, pruritus, skin lesions.   Edema in feet Neurological:  as above Psychiatric: Notes depression and anxiety  Endocrine: No palpitations, diaphoresis, change in appetite, change in weigh or increased thirst Hematologic/Lymphatic:  No anemia, purpura, petechiae. Allergic/Immunologic: No itchy/runny eyes, nasal congestion, recent allergic reactions, rashes  ALLERGIES: Allergies  Allergen Reactions  . Letrozole Other (See Comments)    Suicidal ideation  . Levaquin [Levofloxacin In D5w]   . Ppa-Mma Express [Compleat] Nausea And Vomiting    Blood in vomit and back felt like it was breaking.   Marland Kitchen Zithromax [Azithromycin] Other (See Comments)    abd cramping  . Latex Itching  . Ultram [Tramadol Hcl] Anxiety    HOME MEDICATIONS: Outpatient Medications Prior to Visit  Medication Sig Dispense Refill  . clonazePAM (KLONOPIN) 1 MG tablet TAKE 4 PILLS BY MOUTH AT BEDTIME 360 tablet 1  . cyclobenzaprine (FLEXERIL) 5 MG tablet TAKE 1 TO 2 TABLETS BY MOUTH EVERY DAY AT BEDTIME 60 tablet 5  . hydrochlorothiazide (HYDRODIURIL) 12.5 MG tablet Take 12.5 mg by mouth daily.    . pantoprazole (PROTONIX) 40 MG tablet Take 40 mg by mouth daily.    Marland Kitchen tiZANidine (ZANAFLEX) 4 MG tablet Take 1 tablet in the morning, 1 tablet in the afternoon and 2 tablets at bedtime. 360 tablet 1  . triamcinolone cream (KENALOG) 0.1 % Apply 1 application topically 2 (two) times daily.     No facility-administered medications prior to visit.    PAST MEDICAL HISTORY: Past Medical History:  Diagnosis Date  . Anxiety   . Complication of anesthesia    patient was awake during colonoscopy as drugs were not effective-reports that propofol is an effective analgesic for her  . Concussion   . Depression   . Family history of breast cancer   . Family history of ovarian cancer   . Family history of pancreatic cancer   . Fibromyalgia   . History of radiation therapy 05/20/17- 06/17/17   Left Breast/ 40.05 Gy in 15 fractions, Left Breast Boost/ 10 Gy in 5 fractions.   . Movement disorder    . MS (multiple sclerosis) ([place])    primary progressive MS  . Multiple sclerosis ([place])   . Neuropathy   . Osteoporosis   . Vision abnormalities     PAST SURGICAL HISTORY: Past Surgical History:  Procedure Laterality Date  . BREAST LUMPECTOMY WITH RADIOACTIVE SEED AND SENTINEL LYMPH NODE BIOPSY Left 03/31/2017   Procedure: BREAST LUMPECTOMY WITH RADIOACTIVE SEED AND SENTINEL LYMPH NODE BIOPSY;  Surgeon: Stark Klein, MD;  Location: [facility];  Service: General;  Laterality: Left;  . FRACTURE SURGERY Left    hand  . I & D EXTREMITY  05/30/2011   Procedure: IRRIGATION AND DEBRIDEMENT EXTREMITY;  Surgeon: Tennis Must, MD;  Location: [facility];  Service: Orthopedics;;  Incision and drainage of open Proximal interphalangeal joint with closed reduction of Proximal interphalangeal  joint left long finger  .  KNEE ARTHROSCOPY      FAMILY HISTORY: Family History  Problem Relation Age of Onset  . Hypertension Mother   . Breast cancer Mother 3  . Hypothyroidism Mother   . Hyperlipidemia Father   . Diabetes Father   . Pancreatic cancer Father 95  . Hypothyroidism Sister   . Ovarian cancer Maternal Aunt        dx in her 66s  . Parkinson's disease Maternal Uncle   . Stroke Maternal Uncle   . Breast cancer Maternal Grandmother 100  . Breast cancer Maternal Aunt        dx under 32  . Breast cancer Maternal Aunt        dx over 80s  . Breast cancer Maternal Aunt        dx >50  . Breast cancer Other   . Hypothyroidism Brother   . Breast cancer Cousin   . Breast cancer Sister         Observations/Objective:  Height  64 inches   Weight 130 pounds   BP 124/84   Pulse 103  SaO2 94%   Temp 98.7  She is alert and oriented with normal speech.    Assessment and Plan: Multiple sclerosis, primary progressive ([place]) - Plan: Ambulatory referral to Physical Therapy  Spastic diplegia ([place]) - Plan: Ambulatory referral to Physical Therapy  Frequent falls  Other fatigue - Plan: Ambulatory  referral to Physical Therapy  Spastic gait  Mild cognitive disorder   1.  Continue Healthy diet and keep weight off  2.   We will see if we can get PT increased increased to 3 times a week.  Try to stay active and exercise as tolerated. 3.   F/u in 6 months, sooner if new or worsening symptoms  Follow Up Instructions: I discussed the assessment and treatment plan with the patient. The patient was provided an opportunity to ask questions and all were answered. The patient agreed with the plan and demonstrated an understanding of the instructions.    The patient was advised to call back or seek an in-person evaluation if the symptoms worsen or if the condition fails to improve as anticipated.   Aubrionna Istre A. Felecia Shelling, MD, PhD 51/70/0174, 9:44 PM Certified in Neurology, Clinical Neurophysiology, Sleep Medicine, Pain Medicine and Neuroimaging  [facility] [address] [place], [place] [address] [phone]

## 2020-03-27 ENCOUNTER — Other Ambulatory Visit: Payer: Self-pay | Admitting: Neurology

## 2020-04-19 ENCOUNTER — Other Ambulatory Visit: Payer: Self-pay | Admitting: Family Medicine

## 2020-04-19 DIAGNOSIS — R1900 Intra-abdominal and pelvic swelling, mass and lump, unspecified site: Secondary | ICD-10-CM

## 2020-04-19 DIAGNOSIS — R9389 Abnormal findings on diagnostic imaging of other specified body structures: Secondary | ICD-10-CM

## 2020-05-04 ENCOUNTER — Other Ambulatory Visit: Payer: Self-pay | Admitting: Neurology

## 2020-05-06 ENCOUNTER — Ambulatory Visit
Admission: RE | Admit: 2020-05-06 | Discharge: 2020-05-06 | Disposition: A | Discharge status: Home / Self Care | Payer: Medicare Other | Source: Ambulatory Visit | Attending: Family Medicine | Admitting: Family Medicine

## 2020-05-06 ENCOUNTER — Other Ambulatory Visit: Payer: Self-pay

## 2020-05-06 DIAGNOSIS — R9389 Abnormal findings on diagnostic imaging of other specified body structures: Secondary | ICD-10-CM

## 2020-05-06 DIAGNOSIS — R1900 Intra-abdominal and pelvic swelling, mass and lump, unspecified site: Secondary | ICD-10-CM

## 2020-05-06 MED ORDER — IOPAMIDOL (ISOVUE-300) INJECTION 61%
100.0000 mL | Freq: Once | INTRAVENOUS | Status: AC | PRN
  Administered 2020-05-06: 100 mL via INTRAVENOUS

## 2020-05-17 ENCOUNTER — Other Ambulatory Visit: Payer: Self-pay | Admitting: Family Medicine

## 2020-05-22 ENCOUNTER — Other Ambulatory Visit: Payer: Self-pay | Admitting: Family Medicine

## 2020-05-23 ENCOUNTER — Other Ambulatory Visit: Payer: Self-pay | Admitting: Family Medicine

## 2020-05-23 DIAGNOSIS — K769 Liver disease, unspecified: Secondary | ICD-10-CM

## 2020-06-08 ENCOUNTER — Other Ambulatory Visit: Payer: Self-pay

## 2020-06-08 ENCOUNTER — Ambulatory Visit
Admission: RE | Admit: 2020-06-08 | Discharge: 2020-06-08 | Disposition: A | Discharge status: Home / Self Care | Payer: Medicare Other | Source: Ambulatory Visit | Attending: Family Medicine | Admitting: Family Medicine

## 2020-06-08 DIAGNOSIS — K769 Liver disease, unspecified: Secondary | ICD-10-CM

## 2020-06-08 MED ORDER — GADOBENATE DIMEGLUMINE 529 MG/ML IV SOLN
10.0000 mL | Freq: Once | INTRAVENOUS | Status: AC | PRN
  Administered 2020-06-08: 10 mL via INTRAVENOUS

## 2020-06-13 ENCOUNTER — Telehealth: Payer: Self-pay | Admitting: Neurology

## 2020-06-13 NOTE — Telephone Encounter (Signed)
..   Pt understands that although there may be some limitations with this type of visit, we will take all precautions to reduce any security or privacy concerns.  Pt understands that this will be treated like an in office visit and we will file with pt's insurance, and there may be a patient responsible charge related to this service. ? ?

## 2020-06-26 ENCOUNTER — Other Ambulatory Visit: Payer: Self-pay | Admitting: Neurology

## 2020-07-03 ENCOUNTER — Ambulatory Visit: Payer: Medicare Other | Admitting: Neurology

## 2020-07-09 ENCOUNTER — Telehealth: Payer: Self-pay | Admitting: Neurology

## 2020-07-09 NOTE — Telephone Encounter (Signed)
LVM returning pt call. She has upcoming mychart appt w/ Dr. Felecia Shelling on 07/29/20 and she can also discuss this with him during this appt if she would like

## 2020-07-09 NOTE — Telephone Encounter (Signed)
Pt called says her MS is progressing and causing some bladder issues where she is up and down all throughout the night and she is having to wear something during the day, wants to know if dr. Felecia Shelling could prescribe her something or if she can get something OTC to take. Please advise

## 2020-07-10 NOTE — Telephone Encounter (Signed)
Called pt back. She called back twice and spoke w/ phone staff. Aware of appt on 07/29/20 (VV). She is ok to wait until appt to discuss everything with MD then.

## 2020-07-29 ENCOUNTER — Other Ambulatory Visit: Payer: Self-pay | Admitting: Neurology

## 2020-07-29 ENCOUNTER — Telehealth (INDEPENDENT_AMBULATORY_CARE_PROVIDER_SITE_OTHER): Payer: Medicare Other | Admitting: Neurology

## 2020-07-29 ENCOUNTER — Encounter: Payer: Self-pay | Admitting: Neurology

## 2020-07-29 DIAGNOSIS — G35 Multiple sclerosis: Secondary | ICD-10-CM

## 2020-07-29 DIAGNOSIS — G801 Spastic diplegic cerebral palsy: Secondary | ICD-10-CM

## 2020-07-29 DIAGNOSIS — R5383 Other fatigue: Secondary | ICD-10-CM

## 2020-07-29 DIAGNOSIS — R261 Paralytic gait: Secondary | ICD-10-CM

## 2020-07-29 DIAGNOSIS — R350 Frequency of micturition: Secondary | ICD-10-CM

## 2020-07-29 MED ORDER — FESOTERODINE FUMARATE ER 8 MG PO TB24: 8.0000 mg | ORAL_TABLET | Freq: Every day | ORAL | 5 refills | Status: DC

## 2020-07-29 NOTE — Progress Notes (Signed)
[facility]  PATIENT: [place] Hancock DOB: 1960-02-28  REFERRING CLINICIAN: Shirline Frees   HISTORY FROM: Patient REASON FOR VISIT: PPMS   HISTORICAL  CHIEF COMPLAINT:  No chief complaint on file.   HISTORY OF PRESENT ILLNESS:  Taylor Hancock is a 60 year old woman with primary progressive multiple sclerosis diagnosed in 2005.     Update 07/30/2019: Virtual Visit via vidoe Note I connected with [place] Hancock on 07/29/20 at 11:00 AM EDT by a video and audio application and verified that I am speaking with the correct person.  I discussed the limitations of evaluation and management by telemedicine and the availability of in person appointments. The patient expressed understanding and agreed to proceed.  I saw her on video but she could not hear me so we converted to a telephone to complete the visit.  The patient connected while at home and the provider was at the office.  History of Present Illness: She has PPMS and is not on any DMT.   She was doing PT at [facility].  Gait is poor.  She had a fall last week and was pinned between her furniture and EMS was called.   She has occasional falls.  Last year had  one leading to fractures in her left foot and ankle.     She has trouble standing and walking.  On her best day, she can do 100 steps but on a bad day only 2-3 steps.   She was much better before fracture).  She has a power wheelchair now for indoors.  She also has a scooter for outside      Her left leg is weaker than right.  For spasticity she takes clonazepam, flexeril and tizanidine (just taking at night due to sleepiness) with benefit.      She uses a bedside commode.   She has more incontinence now and wears Depends.  She has urinary urgency.   She stopped Vesicare.   In general bladder is better  She notes fatigue.  She sleeps well.     She was diagnosed with DM last year.   She lost close to 50 pounds.  She has adjusted her diet and is not  smoking or drinking now.   BP is better with reduced weight.    Recent HgbA1c was 6.2 so she is not on any medication.    She has developed pain in her fingertips   She has breast cancer followed by Dr. Lindi Adie.  She has hypertension was 170/100 recently and resumed her BP medications  She was recently diagnosed with DM (glucose was 167 and HgbA1c was 6.9).   She had Covid-19 in 2020.   Recent Abdominal scan showed a liver lesion.   Liver MRI shows "Tiny foci of arterial phase hyperenhancement in the hepatic dome with moderate focus of ill-defined abnormal signal intensity in the subcapsular anterior right liver (segment VIII) corresponding to the abnormality on recent CT. This finding does not represent a discrete lesion but may have some restricted diffusion and does demonstrate differential enhancement after IV contrast administration that  persists on more delayed imaging. Imaging features are nonspecific and geographic fatty deposition may contribute to the appearance. At a minimum, follow-up MRI in 3 months recommended to ensure stability. Short-term follow-up PET-CT could be used to ensure no hypermetabolism in this region."    MS History:  She had a Lhermite sign in the 1990's a few times and she felt her neck was weaker at  that time.  However, gait was not affected at that time.  She was diagnosed with multiple sclerosis in 2005 after presenting with several years of worsening gait. Initially, she was diagnosed with relapsing remitting MS and was placed on Betaseron. She had difficulty tolerating Betaseron but did continue to take it. Her first MRI after the diagnosis was unchanged from her previous one. A couple years later she transferred her care to me. After review of her time course of disease, it was apparent that she had a progressive MS and not a relapsing form of MS. Therefore, the Betaseron was discontinued. Due to extent of disability, Ocrelizumab was not used.  She remains off a DMT.     IMAGING MRI brain 12/13/2018 showed Moderate to severe atrophy, with progression since 2017.  White matter changes in the brain are stable since 2017. Noacute intracranial abnormality  MRI cervical spine 12/13/2018 showed stable spinal cord foci at C2-3, C4, and C5 similar to the prior study. No enhancing lesions.  REVIEW OF SYSTEMS:  Constitutional: No fevers, chills, sweats, or change in appetite.  Notes fatigue.   Has insomnia and sleepiness Eyes: No visual changes, double vision.  Notes light sensitivity and eye pain Ear, nose and throat: No hearing loss, ear pain, nasal congestion, sore throat.    Cardiovascular: No chest pain, palpitations Respiratory:  No shortness of breath at rest or with exertion.   No wheezes GastrointestinaI: No nausea, vomiting, diarrhea, abdominal pain.  Occ fecal incontinence Genitourinary:  see above.    Musculoskeletal:  She has myalgias Integumentary: No rash, pruritus, skin lesions.   Edema in feet Neurological: as above Psychiatric: Notes depression and anxiety  Endocrine: No palpitations, diaphoresis, change in appetite, change in weigh or increased thirst Hematologic/Lymphatic:  No anemia, purpura, petechiae. Allergic/Immunologic: No itchy/runny eyes, nasal congestion, recent allergic reactions, rashes  ALLERGIES: Allergies  Allergen Reactions   Letrozole Other (See Comments)    Suicidal ideation   Levaquin [Levofloxacin In D5w]    Ppa-Mma Express [Compleat] Nausea And Vomiting    Blood in vomit and back felt like it was breaking.    Zithromax [Azithromycin] Other (See Comments)    abd cramping   Latex Itching   Ultram [Tramadol Hcl] Anxiety    HOME MEDICATIONS: Outpatient Medications Prior to Visit  Medication Sig Dispense Refill   clonazePAM (KLONOPIN) 1 MG tablet TAKE 4 PILLS BY MOUTH AT BEDTIME 360 tablet 1   cyclobenzaprine (FLEXERIL) 5 MG tablet TAKE 1 TO 2 TABLETS BY MOUTH EVERY DAY AT BEDTIME 60 tablet 5   hydrochlorothiazide  (HYDRODIURIL) 12.5 MG tablet Take 12.5 mg by mouth daily.     pantoprazole (PROTONIX) 40 MG tablet Take 40 mg by mouth daily.     tiZANidine (ZANAFLEX) 4 MG tablet TAKE 1 TABLET IN THE MORNING, 1 TABLET IN THE AFTERNOON AND 2 TABLETS AT BEDTIME. 360 tablet 1   triamcinolone cream (KENALOG) 0.1 % Apply 1 application topically 2 (two) times daily.     No facility-administered medications prior to visit.    PAST MEDICAL HISTORY: Past Medical History:  Diagnosis Date   Anxiety    Complication of anesthesia    patient was awake during colonoscopy as drugs were not effective-reports that propofol is an effective analgesic for her   Concussion    Depression    Family history of breast cancer    Family history of ovarian cancer    Family history of pancreatic cancer    Fibromyalgia    History  of radiation therapy 05/20/17- 06/17/17   Left Breast/ 40.05 Gy in 15 fractions, Left Breast Boost/ 10 Gy in 5 fractions.    Movement disorder    MS (multiple sclerosis) (HCC)    primary progressive MS   Multiple sclerosis ([place])    Neuropathy    Osteoporosis    Vision abnormalities     PAST SURGICAL HISTORY: Past Surgical History:  Procedure Laterality Date   BREAST LUMPECTOMY WITH RADIOACTIVE SEED AND SENTINEL LYMPH NODE BIOPSY Left 03/31/2017   Procedure: BREAST LUMPECTOMY WITH RADIOACTIVE SEED AND SENTINEL LYMPH NODE BIOPSY;  Surgeon: Stark Klein, MD;  Location: [facility];  Service: General;  Laterality: Left;   FRACTURE SURGERY Left    hand   I & D EXTREMITY  05/30/2011   Procedure: IRRIGATION AND DEBRIDEMENT EXTREMITY;  Surgeon: Tennis Must, MD;  Location: [facility];  Service: Orthopedics;;  Incision and drainage of open Proximal interphalangeal joint with closed reduction of Proximal interphalangeal  joint left long finger   KNEE ARTHROSCOPY      FAMILY HISTORY: Family History  Problem Relation Age of Onset   Hypertension Mother    Breast cancer Mother 1   Hypothyroidism Mother     Hyperlipidemia Father    Diabetes Father    Pancreatic cancer Father 106   Hypothyroidism Sister    Ovarian cancer Maternal Aunt        dx in her 70s   Parkinson's disease Maternal Uncle    Stroke Maternal Uncle    Breast cancer Maternal Grandmother 100   Breast cancer Maternal Aunt        dx under 71   Breast cancer Maternal Aunt        dx over 94s   Breast cancer Maternal Aunt        dx >50   Breast cancer Other    Hypothyroidism Brother    Breast cancer Cousin    Breast cancer Sister         Observations/Objective:  Height  64 inches   Weight 130 pounds   BP 124/84   Pulse 103  SaO2 94%   Temp 98.7  She is alert and oriented with normal speech.    Assessment and Plan: Multiple sclerosis, primary progressive (HCC)  Spastic diplegia (HCC)  Spastic gait  Urinary frequency  Other fatigue   1.   She will remain off of a disease modifying therapy for her primary progressive MS.  Advised to eat a healthy diet and keep weight off  2.   We will see if we can get PT increased increased to 2 times a week.  Try to stay active and exercise as tolerated. 3.   Toviaz for incontinence.  She did not get much benefit from vesicare.   If not better add Myrbetriq. 4.    F/u in 6 months, sooner if new or worsening symptoms  Follow Up Instructions: I discussed the assessment and treatment plan with the patient. The patient was provided an opportunity to ask questions and all were answered. The patient agreed with the plan and demonstrated an understanding of the instructions.    The patient was advised to call back or seek an in-person evaluation if the symptoms worsen or if the condition fails to improve as anticipated.  32-minute office visit with the majority of the time communicating with patient virtually.      Jamesina Gaugh A. Felecia Shelling, MD, PhD 2/77/8242, 35:36 AM Certified in Neurology, Clinical Neurophysiology, Sleep Medicine, Pain Medicine and  Neuroimaging  [facility] [address], [place], [place] [address] [phone]

## 2020-07-30 ENCOUNTER — Other Ambulatory Visit: Payer: Self-pay | Admitting: *Deleted

## 2020-07-30 ENCOUNTER — Other Ambulatory Visit: Payer: Self-pay

## 2020-08-05 ENCOUNTER — Telehealth: Payer: Self-pay | Admitting: Neurology

## 2020-08-05 NOTE — Telephone Encounter (Signed)
Called the PT, [place]. There was no answer. LVM the therapist Dr Felecia Shelling is ok with what is needed for the patient. Advised she can call back if needed clarification.  ** If therapist calls back and I am unavailable, please advise the PT, Dr Felecia Shelling agrees with what is needed for the patient.

## 2020-08-05 NOTE — Telephone Encounter (Signed)
[place] Hancock(PT w/[facility]) has called for verbal orders for 2 week 6 and 1 week 2, [name] can be reached at [phone]

## 2020-09-12 ENCOUNTER — Other Ambulatory Visit: Payer: Self-pay | Admitting: Family Medicine

## 2020-09-12 DIAGNOSIS — K7689 Other specified diseases of liver: Secondary | ICD-10-CM

## 2020-09-13 ENCOUNTER — Other Ambulatory Visit: Payer: Self-pay | Admitting: Family Medicine

## 2020-09-13 DIAGNOSIS — K7689 Other specified diseases of liver: Secondary | ICD-10-CM

## 2020-09-19 ENCOUNTER — Encounter: Payer: Self-pay | Admitting: Neurology

## 2020-09-19 ENCOUNTER — Ambulatory Visit (INDEPENDENT_AMBULATORY_CARE_PROVIDER_SITE_OTHER): Payer: Medicare Other | Admitting: Neurology

## 2020-09-19 VITALS — BP 128/71 | HR 72 | Ht 64.0 in | Wt 131.0 lb

## 2020-09-19 DIAGNOSIS — R350 Frequency of micturition: Secondary | ICD-10-CM | POA: Diagnosis not present

## 2020-09-19 DIAGNOSIS — G801 Spastic diplegic cerebral palsy: Secondary | ICD-10-CM

## 2020-09-19 DIAGNOSIS — R296 Repeated falls: Secondary | ICD-10-CM

## 2020-09-19 DIAGNOSIS — F09 Unspecified mental disorder due to known physiological condition: Secondary | ICD-10-CM

## 2020-09-19 DIAGNOSIS — G35 Multiple sclerosis: Secondary | ICD-10-CM

## 2020-09-19 DIAGNOSIS — R5383 Other fatigue: Secondary | ICD-10-CM

## 2020-09-19 MED ORDER — MIRABEGRON ER 50 MG PO TB24: 50.0000 mg | ORAL_TABLET | Freq: Every day | ORAL | 11 refills | Status: DC

## 2020-09-19 NOTE — Progress Notes (Signed)
[facility]  PATIENT: [place] Hancock DOB: Jun 09, 1960  REFERRING CLINICIAN: Shirline Frees   HISTORY FROM: Patient  REASON FOR VISIT: PPMS   HISTORICAL  CHIEF COMPLAINT:  Chief Complaint  Patient presents with   Follow-up    Rm 2, alone. Here for month MS f/u, in [place]. Pt has had bladder and bowel problems, has to wear pull ups. She has quit smoking, alcohol and has been caffeine free. Has been having PT at home to help w walking.      HISTORY OF PRESENT ILLNESS:  Taylor Hancock is a 60 year old woman with primary progressive multiple sclerosis diagnosed in 2005.     Update 09/19/2020 She has PPMS and is not on any DMT.   She was doing PT at [facility].  Gait is poor.  She had a fall last week and was pinned between her furniture and EMS was called.   She has occasional falls.  Last year had  one leading to fractures in her left foot and ankle.     She has more incontinence now and wears Depends.  She has urinary urgency.   Vesicare and Lisbeth Ply were not tolerated.    She is trying  In general bladder is better She has trouble standing and walking.  On her best day, she can do 100 steps but on a bad day only 2-3 steps.   She was much better before fracture).  She has a power wheelchair now for indoors.  She also has a scooter for outside      Her left leg is weaker than right.  Spasticity has slowly worsened.  For spasticity she takes clonazepam, flexeril and tizanidine (just taking at night due to sleepiness) with benefit.      She notes fatigue.  She sleeps well.  She had Covid and has been diagnosed with long Covid.     She was diagnosed with DM last year.   She adjusted her diet and is not smoking or drinking now.   BP is better with reduced weight.       She has breast cancer followed by Dr. Lindi Adie.  She has hypertension was 170/100 recently and resumed her BP medications  She was recently diagnosed with DM (glucose was 167 and HgbA1c was 6.9).   Recent  Abdominal scan showed a liver lesion and she will be getting another MRI.   Initial liver MRI shows "Tiny foci of arterial phase hyperenhancement in the hepatic dome with moderate focus of ill-defined abnormal signal intensity in the subcapsular anterior right liver (segment VIII) corresponding to the abnormality on recent CT. This finding does not represent a discrete lesion but may have some restricted diffusion and does demonstrate differential enhancement after IV contrast administration that  persists on more delayed imaging. Imaging features are nonspecific and geographic fatty deposition may contribute to the appearance. At a minimum, follow-up MRI in 3 months recommended to ensure stability. Short-term follow-up PET-CT could be used to ensure no hypermetabolism in this region."    MS History:  She had a Lhermite sign in the 1990's a few times and she felt her neck was weaker at that time.  However, gait was not affected at that time.  She was diagnosed with multiple sclerosis in 2005 after presenting with several years of worsening gait. Initially, she was diagnosed with relapsing remitting MS and was placed on Betaseron. She had difficulty tolerating Betaseron but did continue to take it. Her first MRI after the diagnosis  was unchanged from her previous one. A couple years later she transferred her care to me. After review of her time course of disease, it was apparent that she had a progressive MS and not a relapsing form of MS. Therefore, the Betaseron was discontinued. Due to extent of disability, Ocrelizumab was not used.  She remains off a DMT.    IMAGING MRI brain 12/13/2018 showed Moderate to severe atrophy, with progression since 2017.  White matter changes in the brain are stable since 2017. Noacute intracranial abnormality  MRI cervical spine 12/13/2018 showed stable spinal cord foci at C2-3, C4, and C5 similar to the prior study. No enhancing lesions.  REVIEW OF SYSTEMS:   Constitutional: No fevers, chills, sweats, or change in appetite.  Notes fatigue.   Has insomnia and sleepiness Eyes: No visual changes, double vision.  Notes light sensitivity and eye pain Ear, nose and throat: No hearing loss, ear pain, nasal congestion, sore throat.    Cardiovascular: No chest pain, palpitations Respiratory:  No shortness of breath at rest or with exertion.   No wheezes GastrointestinaI: No nausea, vomiting, diarrhea, abdominal pain.  Occ fecal incontinence Genitourinary:  see above.    Musculoskeletal:  She has myalgias Integumentary: No rash, pruritus, skin lesions.   Edema in feet Neurological: as above Psychiatric: Notes depression and anxiety  Endocrine: No palpitations, diaphoresis, change in appetite, change in weigh or increased thirst Hematologic/Lymphatic:  No anemia, purpura, petechiae. Allergic/Immunologic: No itchy/runny eyes, nasal congestion, recent allergic reactions, rashes  ALLERGIES: Allergies  Allergen Reactions   Letrozole Other (See Comments)    Suicidal ideation   Levaquin [Levofloxacin In D5w]    Ppa-Mma Express [Compleat] Nausea And Vomiting    Blood in vomit and back felt like it was breaking.    Zithromax [Azithromycin] Other (See Comments)    abd cramping   Latex Itching   Ultram [Tramadol Hcl] Anxiety    HOME MEDICATIONS: Outpatient Medications Prior to Visit  Medication Sig Dispense Refill   amLODipine (NORVASC) 5 MG tablet Take 5 mg by mouth daily.     clonazePAM (KLONOPIN) 1 MG tablet TAKE 4 PILLS BY MOUTH AT BEDTIME 360 tablet 1   cyclobenzaprine (FLEXERIL) 5 MG tablet TAKE 1 TO 2 TABLETS BY MOUTH EVERY DAY AT BEDTIME 60 tablet 5   fesoterodine (TOVIAZ) 8 MG TB24 tablet Take 1 tablet (8 mg total) by mouth daily. 30 tablet 5   ketoconazole (NIZORAL) 2 % cream Apply 1 application topically every other day.     pantoprazole (PROTONIX) 40 MG tablet Take 40 mg by mouth daily.     tiZANidine (ZANAFLEX) 4 MG tablet TAKE 1 TABLET IN  THE MORNING, 1 TABLET IN THE AFTERNOON AND 2 TABLETS AT BEDTIME. 360 tablet 1   triamcinolone cream (KENALOG) 0.1 % Apply 1 application topically 2 (two) times daily.     No facility-administered medications prior to visit.    PAST MEDICAL HISTORY: Past Medical History:  Diagnosis Date   Anxiety    Complication of anesthesia    patient was awake during colonoscopy as drugs were not effective-reports that propofol is an effective analgesic for her   Concussion    Depression    Family history of breast cancer    Family history of ovarian cancer    Family history of pancreatic cancer    Fibromyalgia    History of radiation therapy 05/20/17- 06/17/17   Left Breast/ 40.05 Gy in 15 fractions, Left Breast Boost/ 10 Gy in 5 fractions.  Movement disorder    MS (multiple sclerosis) (HCC)    primary progressive MS   Multiple sclerosis ([place])    Neuropathy    Osteoporosis    Vision abnormalities     PAST SURGICAL HISTORY: Past Surgical History:  Procedure Laterality Date   BREAST LUMPECTOMY WITH RADIOACTIVE SEED AND SENTINEL LYMPH NODE BIOPSY Left 03/31/2017   Procedure: BREAST LUMPECTOMY WITH RADIOACTIVE SEED AND SENTINEL LYMPH NODE BIOPSY;  Surgeon: Stark Klein, MD;  Location: [facility];  Service: General;  Laterality: Left;   FRACTURE SURGERY Left    hand   I & D EXTREMITY  05/30/2011   Procedure: IRRIGATION AND DEBRIDEMENT EXTREMITY;  Surgeon: Tennis Must, MD;  Location: [facility];  Service: Orthopedics;;  Incision and drainage of open Proximal interphalangeal joint with closed reduction of Proximal interphalangeal  joint left long finger   KNEE ARTHROSCOPY      FAMILY HISTORY: Family History  Problem Relation Age of Onset   Hypertension Mother    Breast cancer Mother 45   Hypothyroidism Mother    Hyperlipidemia Father    Diabetes Father    Pancreatic cancer Father 82   Hypothyroidism Sister    Ovarian cancer Maternal Aunt        dx in her 36s   Parkinson's disease Maternal  Uncle    Stroke Maternal Uncle    Breast cancer Maternal Grandmother 100   Breast cancer Maternal Aunt        dx under 76   Breast cancer Maternal Aunt        dx over 71s   Breast cancer Maternal Aunt        dx >50   Breast cancer Other    Hypothyroidism Brother    Breast cancer Cousin    Breast cancer Sister         Observations/Objective:  Vitals:   09/19/20 1443  BP: 128/71  Pulse: 72  SpO2: 99%    General: The patient is well-developed and well-nourished and in mild distress.   Neurologic Exam   Mental status: The patient is alert and oriented x 3 at the time of the examination. She has reduced concentration and had trouble coming up with words at times.     Cranial nerves: Extraocular movements are full.    Facial strength and sensation is normal. Trapezius strength is norma Palatal elevation. Tongue protrusion is midline. Hearing is symmetrically    Motor:  Muscle bulk is normal. She has moderate left and mild right increased tone. Strength is 2 to 2+/5 in the legs, left worse than right.   Right arm is 5/5 but left arm and hand are 4/5.      Sensory: She has reduced vibration in her left leg.  Touch is more symmetric   Coordination: Cerebellar testing reveals reduced left left finger-nose-finger.   She has reduced heel-to-shin on the right and isunable to do on her left   Gait and station: Station is unstable and requires bilateral support.  She needs strong bilateral support to take one step   Reflexes: Deep tendon reflexes are symmetric and normal in the arms but she has increased reflexes in both legs. Left > right,  with nonsustained clonus at the ankles, worse on the left    Assessment and Plan: Multiple sclerosis, primary progressive (HCC)  Spastic diplegia (HCC)  Urinary frequency  Frequent falls  Mild cognitive disorder  Other fatigue   1.   She will remain off of a disease modifying  therapy for her primary progressive MS.  Advised to eat a  healthy diet and keep weight off  2.   We will see if we can get PT increased increased to 2 times a week.  Try to stay active and exercise as tolerated. 3.   Myrbetriq for incontinence.  She did not get much benefit from vesicare, oxybutynin or Toviaz.  4.    F/u in 6 months, sooner if new or worsening symptoms     Dominick Zertuche A. Felecia Shelling, MD, PhD [id number], [id number] PM Certified in Neurology, Clinical Neurophysiology, Sleep Medicine, Pain Medicine and Neuroimaging  [facility] [address] [place], [place] [address] [phone]

## 2020-09-24 ENCOUNTER — Telehealth: Payer: Self-pay | Admitting: Neurology

## 2020-09-24 ENCOUNTER — Other Ambulatory Visit: Payer: Self-pay | Admitting: Neurology

## 2020-09-24 NOTE — Telephone Encounter (Signed)
Called [facility] and provided the Verbal order that was needed. She was appreciative for the call back.

## 2020-09-24 NOTE — Telephone Encounter (Signed)
[name] @ [facility] has called to continue [facility] PT 2 week 3 and 1 week 1 for gate training and standing balance.  Taylor Hancock 's vm is secure if a message needs to be left.

## 2020-09-25 ENCOUNTER — Telehealth: Payer: Self-pay

## 2020-09-25 NOTE — Telephone Encounter (Signed)
Submitted PA for myrbetriq on CMM.  KeyVick Frees  Case [id number]  Waiting on determination from [place].

## 2020-09-26 NOTE — Telephone Encounter (Signed)
I have faxed over approval letter to pharmacy and received confirmation letter.

## 2020-09-26 NOTE — Telephone Encounter (Signed)
Called and LVM asking for call back.  Spoke w/ Dr. Felecia Shelling. Pt has not tried/failed XR. Ok to change to this if need be

## 2020-09-26 NOTE — Telephone Encounter (Signed)
Took call from phone staff. Spoke w/ suda. Provided clinical info. Appeal approved. She will send letter of approval.

## 2020-09-26 NOTE — Telephone Encounter (Signed)
[facility] [place]) called, verify if pt has tried Oxybutynin extended release.  Contact Info: [phone], Ext [phone]

## 2020-09-29 ENCOUNTER — Ambulatory Visit
Admission: RE | Admit: 2020-09-29 | Discharge: 2020-09-29 | Disposition: A | Discharge status: Home / Self Care | Payer: Medicare Other | Source: Ambulatory Visit | Attending: Family Medicine | Admitting: Family Medicine

## 2020-09-29 ENCOUNTER — Other Ambulatory Visit: Payer: Self-pay

## 2020-09-29 DIAGNOSIS — K7689 Other specified diseases of liver: Secondary | ICD-10-CM

## 2020-09-29 MED ORDER — GADOBENATE DIMEGLUMINE 529 MG/ML IV SOLN
13.0000 mL | Freq: Once | INTRAVENOUS | Status: AC | PRN
  Administered 2020-09-29: 13 mL via INTRAVENOUS

## 2020-09-30 ENCOUNTER — Telehealth: Payer: Self-pay | Admitting: Neurology

## 2020-09-30 NOTE — Telephone Encounter (Signed)
[facility]) called, to report a fall; housekeeper placed bed side commode too far away. Pt slowly slid down to the floor, no injuries to report.

## 2020-10-03 ENCOUNTER — Telehealth: Payer: Self-pay | Admitting: Neurology

## 2020-10-03 NOTE — Telephone Encounter (Signed)
Taylor Hancock PT from [facility] called to report a fall. States patient fell 3 times since last Friday. Please advise.

## 2020-10-03 NOTE — Telephone Encounter (Signed)
I Called the PT back.  There was no answer.  Left a message advising that her message was received in regards to the patient having increase in falls. Advised I would also send this as a FYI to Dr Felecia Shelling for him to know. Advised her to call back if anything else is needed from [place].

## 2020-10-08 ENCOUNTER — Other Ambulatory Visit (HOSPITAL_COMMUNITY): Payer: Self-pay | Admitting: Family Medicine

## 2020-10-08 ENCOUNTER — Other Ambulatory Visit: Payer: Self-pay | Admitting: Family Medicine

## 2020-10-08 DIAGNOSIS — R932 Abnormal findings on diagnostic imaging of liver and biliary tract: Secondary | ICD-10-CM

## 2020-10-09 ENCOUNTER — Telehealth: Payer: Self-pay | Admitting: Neurology

## 2020-10-09 ENCOUNTER — Telehealth: Payer: Self-pay | Admitting: Radiology

## 2020-10-09 NOTE — Telephone Encounter (Signed)
Pt is asking for a call to discuss how she is unable to afford the [facility] even at the $150.00 a month rate.  Pt is asking for a call to discuss when it was that she was on vesicare and what her complaint was while on it.

## 2020-10-09 NOTE — Telephone Encounter (Signed)
Called the patient back. She states despite the Myrbetriq being covered under insurance a mth supply will cost 150$ and she can't afford it.  Pt has already tried and failed vesicare and toviaz and had side effects with these.  She has not tried Oxybutynin but states a friend had side effects with drying out.  She knows it has been discussed about potentially having to be referred to urology but wanted to check in first and see what Dr Felecia Shelling thinks. She has been limiting water intake in order to avoid voiding so much but know that isnt good for her. Advised I would send this information to Dr Felecia Shelling and get his thoughts and recommendations and we will contact her once discussed with him. Pt verbalized understanding.

## 2020-10-10 ENCOUNTER — Encounter: Payer: Self-pay | Admitting: Neurology

## 2020-10-10 ENCOUNTER — Other Ambulatory Visit: Payer: Self-pay | Admitting: Neurology

## 2020-10-10 DIAGNOSIS — R350 Frequency of micturition: Secondary | ICD-10-CM

## 2020-10-10 DIAGNOSIS — G801 Spastic diplegic cerebral palsy: Secondary | ICD-10-CM

## 2020-10-10 DIAGNOSIS — G35 Multiple sclerosis: Secondary | ICD-10-CM

## 2020-10-10 DIAGNOSIS — R296 Repeated falls: Secondary | ICD-10-CM

## 2020-10-10 NOTE — Telephone Encounter (Signed)
Called the patient back. There was no answer. Unable to leave a detailed message on VM. LVM advising pt can call back or review mychart message.

## 2020-10-15 NOTE — Telephone Encounter (Signed)
Referral sent to [facility] in [facility]. Phone: [phone].

## 2020-10-22 ENCOUNTER — Other Ambulatory Visit: Payer: Self-pay | Admitting: *Deleted

## 2020-10-22 DIAGNOSIS — R261 Paralytic gait: Secondary | ICD-10-CM

## 2020-10-22 DIAGNOSIS — R269 Unspecified abnormalities of gait and mobility: Secondary | ICD-10-CM

## 2020-10-22 DIAGNOSIS — R296 Repeated falls: Secondary | ICD-10-CM

## 2020-10-22 DIAGNOSIS — G801 Spastic diplegic cerebral palsy: Secondary | ICD-10-CM

## 2020-10-22 DIAGNOSIS — G35 Multiple sclerosis: Secondary | ICD-10-CM

## 2020-10-30 ENCOUNTER — Other Ambulatory Visit: Payer: Self-pay | Admitting: Neurology

## 2020-11-01 ENCOUNTER — Other Ambulatory Visit: Payer: Self-pay

## 2020-11-01 ENCOUNTER — Ambulatory Visit (HOSPITAL_COMMUNITY)
Admission: RE | Admit: 2020-11-01 | Discharge: 2020-11-01 | Disposition: A | Discharge status: Home / Self Care | Payer: Medicare Other | Source: Ambulatory Visit | Attending: Family Medicine | Admitting: Family Medicine

## 2020-11-01 ENCOUNTER — Other Ambulatory Visit: Payer: Self-pay | Admitting: Neurology

## 2020-11-01 DIAGNOSIS — R932 Abnormal findings on diagnostic imaging of liver and biliary tract: Secondary | ICD-10-CM | POA: Insufficient documentation

## 2020-11-01 DIAGNOSIS — I700 Atherosclerosis of aorta: Secondary | ICD-10-CM | POA: Insufficient documentation

## 2020-11-01 DIAGNOSIS — I2510 Atherosclerotic heart disease of native coronary artery without angina pectoris: Secondary | ICD-10-CM | POA: Insufficient documentation

## 2020-11-01 LAB — GLUCOSE, CAPILLARY: Glucose-Capillary: 106 mg/dL — ABNORMAL HIGH (ref 70–99)

## 2020-11-01 MED ORDER — FLUDEOXYGLUCOSE F - 18 (FDG) INJECTION
7.8000 | Freq: Once | INTRAVENOUS | Status: AC
  Administered 2020-11-01: 6.5 via INTRAVENOUS

## 2020-11-06 DIAGNOSIS — B351 Tinea unguium: Secondary | ICD-10-CM | POA: Insufficient documentation

## 2020-11-06 DIAGNOSIS — M2041 Other hammer toe(s) (acquired), right foot: Secondary | ICD-10-CM | POA: Insufficient documentation

## 2020-11-18 IMAGING — CT CT HEAD W/O CM
4 series · 16 of 47 positions shown, 18 images · non-contrast
Comparison: CT 1401, MRI 0130

CLINICAL DATA: Altered mental status

EXAM:
CT HEAD WITHOUT CONTRAST
TECHNIQUE: Contiguous axial images were obtained from the base of the skull
through the vertex without intravenous contrast.

[Series 3: head wo · axial · 0.42mm/px · z∈[-124,-9]mm · 7 of 31 slices shown, 9 images]
[im 4/31  brain]
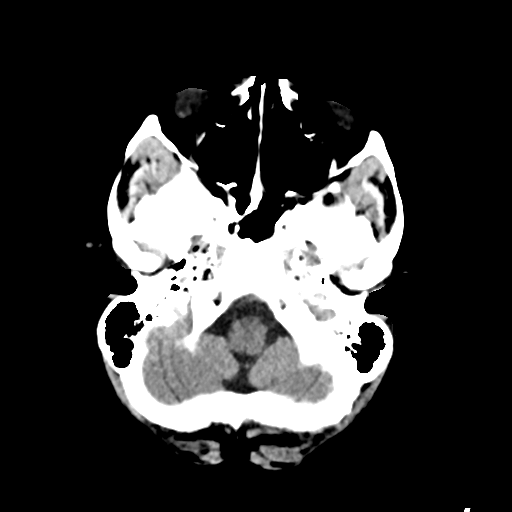
[im 4/31  bone]
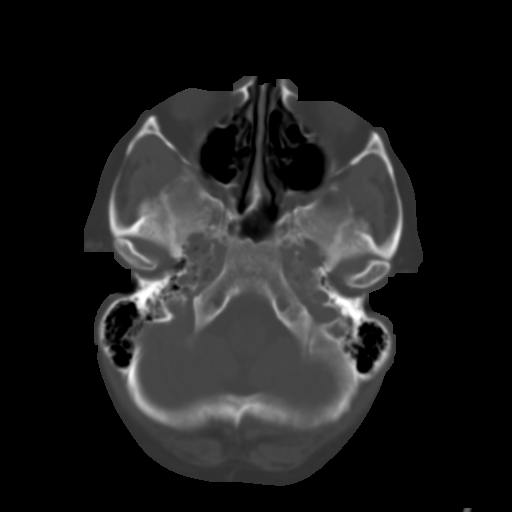
[im 8/31  brain]
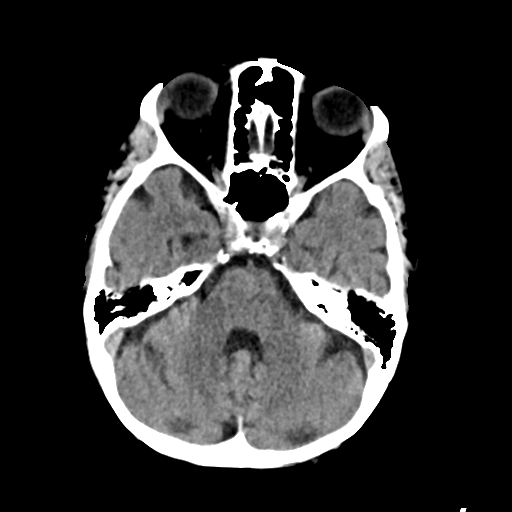
[im 12/31  brain]
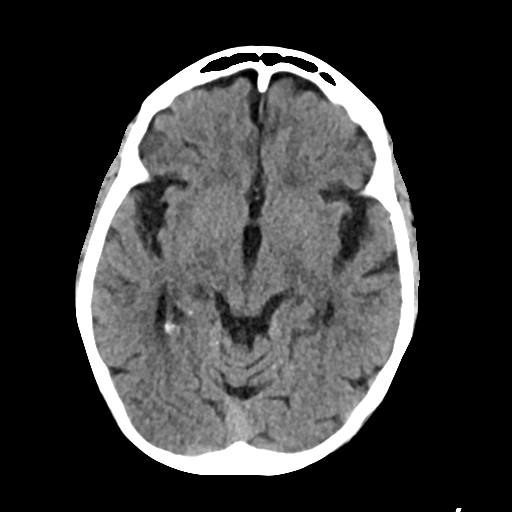
[im 16/31  brain]
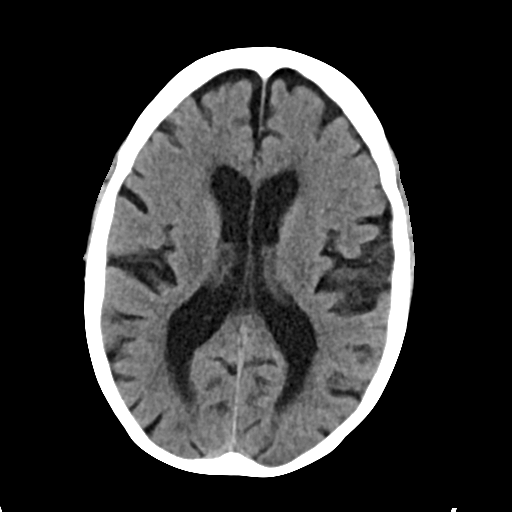
[im 19/31  brain]
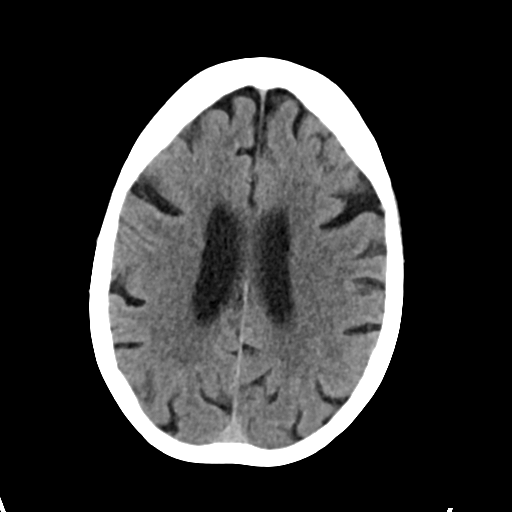
[im 19/31  bone]
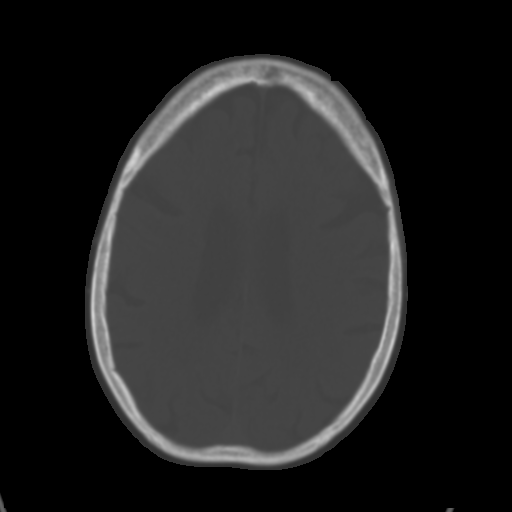
[im 23/31  brain]
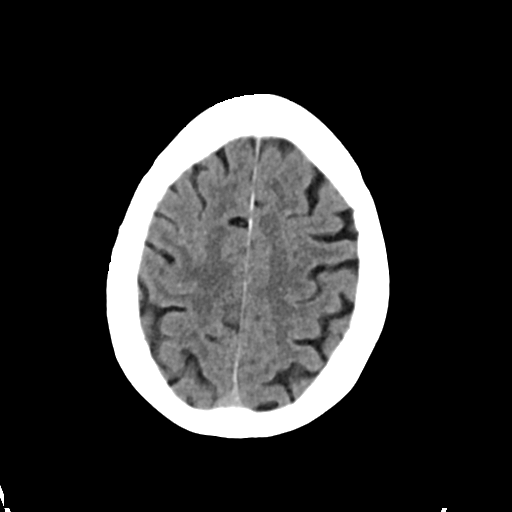
[im 27/31  brain]
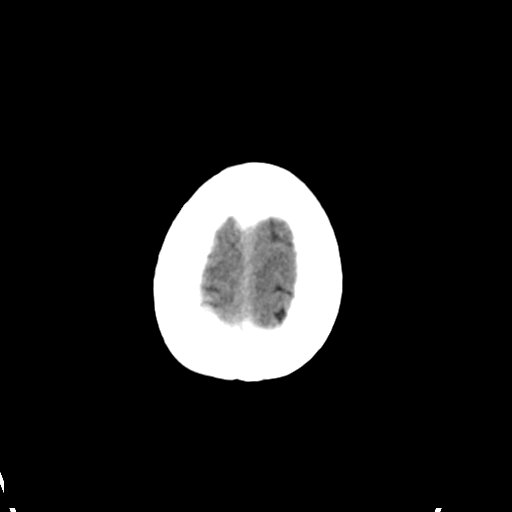

[Series 4: head bone · axial · 0.42mm/px · z∈[-125,-95]mm · 3 of 77 slices shown]
[im 8/77  bone]
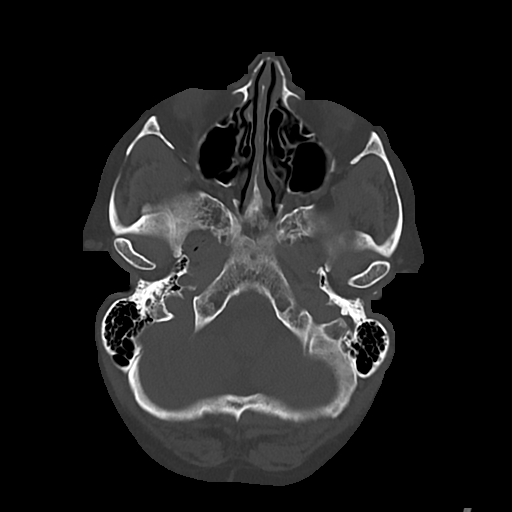
[im 16/77  bone]
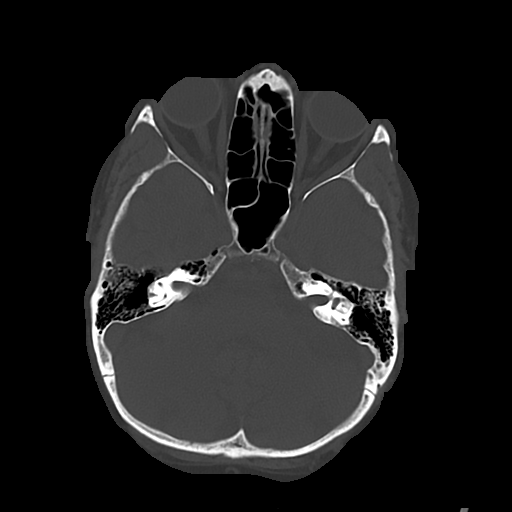
[im 23/77  bone]
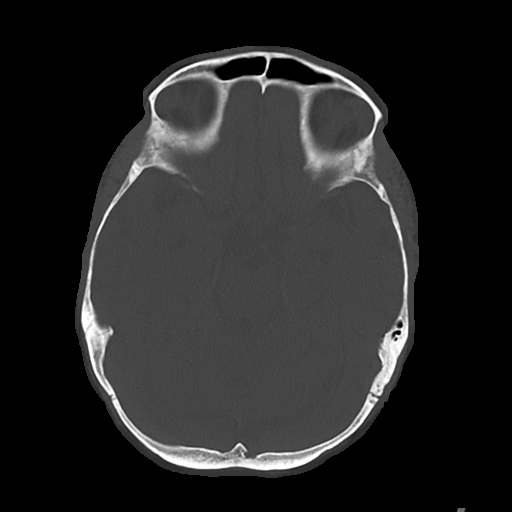

[Series 5: cor soft · coronal · 0.30mm/px · 3 of 69 slices shown]
[im 23/69  brain]
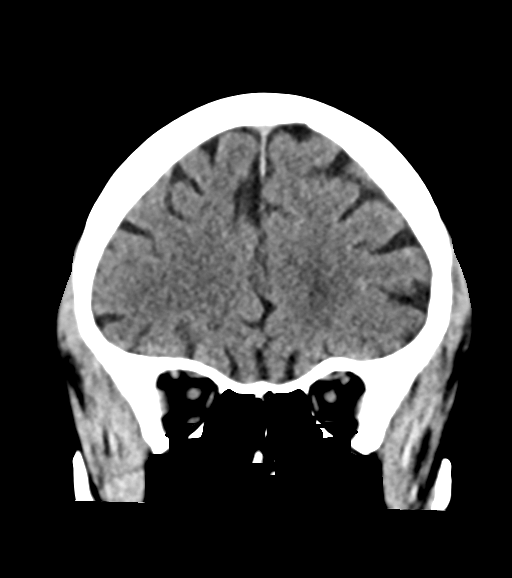
[im 31/69  brain]
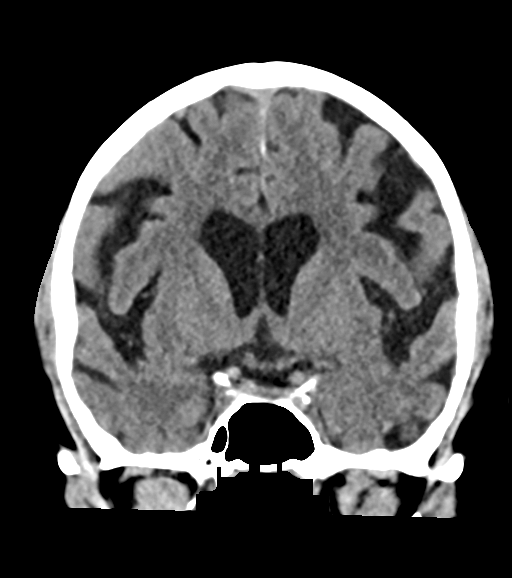
[im 38/69  brain]
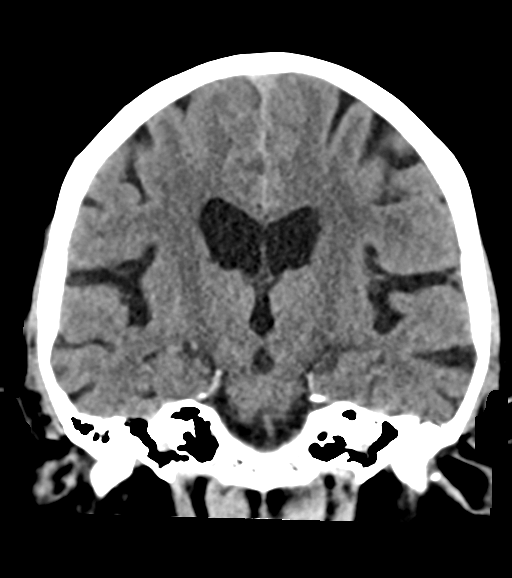

[Series 6: sag soft · sagittal · 0.32mm/px · 3 of 59 slices shown]
[im 20/59  brain]
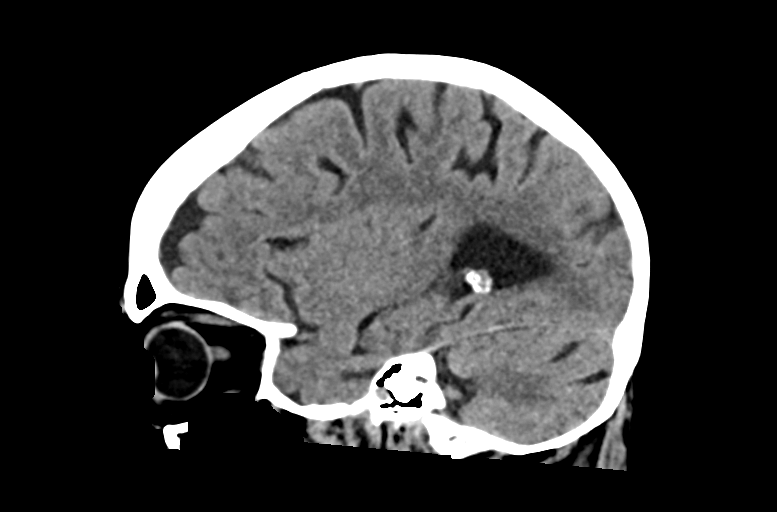
[im 30/59  brain]
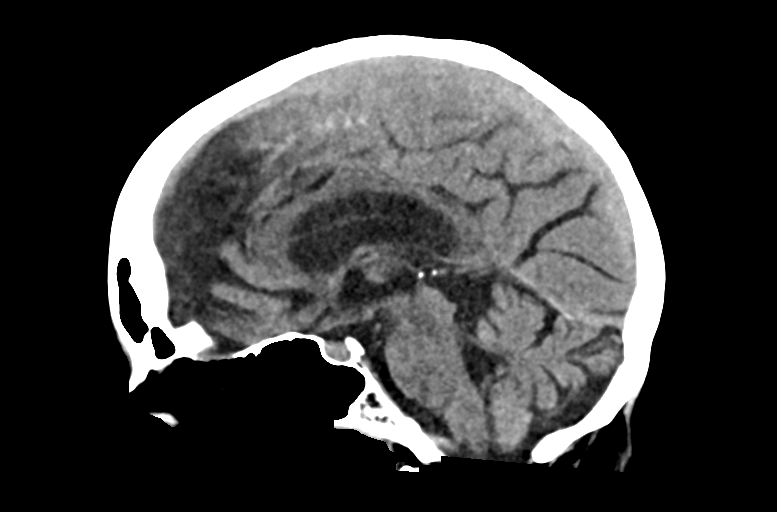
[im 39/59  brain]
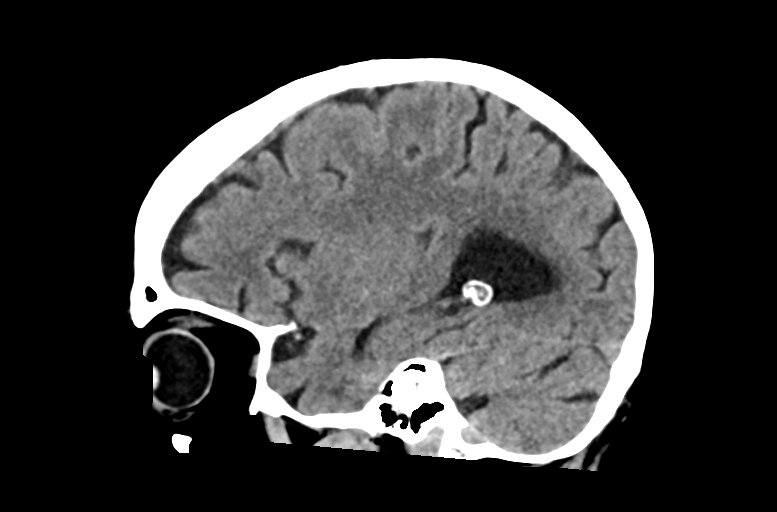

[16 of 47 positions shown; findings below may reference images not displayed]

FINDINGS: Brain: There is no acute intracranial hemorrhage, mass-effect, or
edema. Gray-white differentiation is preserved. There is no
extra-axial fluid collection. Ventricles and sulci are within normal
limits in size and configuration. Patchy hypoattenuation in the
supratentorial white matter is nonspecific but may reflect chronic
microvascular ischemic changes and are demyelination associated with
reported history of multiple sclerosis.

Vascular: No hyperdense vessel or unexpected calcification.

Skull: Calvarium is unremarkable.

Sinuses/Orbits: No acute finding.

Other: None.
IMPRESSION: No acute intracranial hemorrhage, mass effect, or evidence of acute
infarction.

Mild nonspecific white matter changes that may reflect demyelination
associated with reported history of multiple sclerosis or chronic
microvascular ischemic changes.

## 2020-11-19 ENCOUNTER — Other Ambulatory Visit: Payer: Self-pay | Admitting: Neurology

## 2020-11-19 DIAGNOSIS — R269 Unspecified abnormalities of gait and mobility: Secondary | ICD-10-CM

## 2020-11-19 DIAGNOSIS — G35 Multiple sclerosis: Secondary | ICD-10-CM

## 2020-11-19 DIAGNOSIS — F09 Unspecified mental disorder due to known physiological condition: Secondary | ICD-10-CM

## 2020-11-19 DIAGNOSIS — G801 Spastic diplegic cerebral palsy: Secondary | ICD-10-CM

## 2020-11-19 DIAGNOSIS — R296 Repeated falls: Secondary | ICD-10-CM

## 2020-11-21 ENCOUNTER — Telehealth: Payer: Self-pay | Admitting: Neurology

## 2020-11-21 NOTE — Telephone Encounter (Signed)
[facility]) requesting verbal order for PT. Frequency 2x wk for 4 wks.

## 2020-11-21 NOTE — Telephone Encounter (Signed)
Called back and spoke w/ Baker. Provided VO as requested. She verbalized understanding, nothing further needed.

## 2020-12-10 ENCOUNTER — Telehealth: Payer: Self-pay | Admitting: Neurology

## 2020-12-10 ENCOUNTER — Other Ambulatory Visit: Payer: Self-pay | Admitting: *Deleted

## 2020-12-10 ENCOUNTER — Encounter: Payer: Self-pay | Admitting: Neurology

## 2020-12-10 NOTE — Telephone Encounter (Signed)
Dr. Felecia Shelling- she was placed on cipro for UTI. Received the following alert:"Plasma concentrations and pharmacologic effects of tizanidine may be increased by ciprofloxacin. The potential for adverse effects such as hypotension and dizziness may be increased. Use of ciprofloxacin with tizanidine is contraindicated."  How would you like her to proceed?

## 2020-12-10 NOTE — Telephone Encounter (Signed)
Pt called stating she has a UTI, and the pharmacist told her to not take her tiZANidine (ZANAFLEX) 4 MG tablet and the antibiotic for her UTI. Pt requesting a call back.

## 2020-12-24 ENCOUNTER — Other Ambulatory Visit: Payer: Self-pay | Admitting: Neurology

## 2020-12-24 ENCOUNTER — Encounter: Payer: Self-pay | Admitting: Neurology

## 2020-12-24 ENCOUNTER — Other Ambulatory Visit: Payer: Self-pay | Admitting: *Deleted

## 2020-12-24 MED ORDER — CLONAZEPAM 1 MG PO TABS: ORAL_TABLET | ORAL | 1 refills | Status: DC

## 2021-01-08 ENCOUNTER — Telehealth: Payer: Self-pay | Admitting: Neurology

## 2021-01-08 NOTE — Telephone Encounter (Signed)
Clent Demark Physical Therapist for [facility] called wanting to report that the pt had a fall Saturday the 17th of Dec in her bedroom at home. Pt slid off of the bed while trying to get to the commode. Pt was able to get herself up and did not seek help from an ER or provider. Pt did hurt her L knee.

## 2021-01-08 NOTE — Telephone Encounter (Signed)
Called Taylor Hancock back. Thanked him for update on pt. Advised pt should f/u with PCP for evaluation/treatment post fall. Advised him to call back if any questions. Aware I will send pt a message as well.

## 2021-01-10 ENCOUNTER — Encounter: Payer: Self-pay | Admitting: Neurology

## 2021-01-16 ENCOUNTER — Telehealth: Payer: Self-pay | Admitting: Neurology

## 2021-01-16 NOTE — Telephone Encounter (Signed)
Charri from [facility] called needing VO for 2 weeks 4x 1 week 4x Please advise.

## 2021-01-16 NOTE — Telephone Encounter (Signed)
Called and LVM providing requested VO. Asked her to call back if anything further is needed.

## 2021-02-27 ENCOUNTER — Telehealth: Payer: Self-pay | Admitting: Neurology

## 2021-02-27 DIAGNOSIS — G801 Spastic diplegic cerebral palsy: Secondary | ICD-10-CM

## 2021-02-27 DIAGNOSIS — R296 Repeated falls: Secondary | ICD-10-CM

## 2021-02-27 DIAGNOSIS — G35 Multiple sclerosis: Secondary | ICD-10-CM

## 2021-02-27 DIAGNOSIS — R261 Paralytic gait: Secondary | ICD-10-CM

## 2021-02-27 DIAGNOSIS — R269 Unspecified abnormalities of gait and mobility: Secondary | ICD-10-CM

## 2021-02-27 NOTE — Telephone Encounter (Signed)
Called pt back. Advised PT determines frequency of visits based on evaluation. Pt husband LVM last night asking to get set up for second visit this week. Waiting on call. She spoke w/ Medicare who informed her she is able to have unlimited PT visits. Asked her to call back in future if they ever need new referral placed for re-auth if she needs to continue PT. She verbalized understanding.

## 2021-02-27 NOTE — Telephone Encounter (Signed)
Pt states [facility] is trying to cut her down to once a week, pt states she needs at least twice a week in her PT, please call.

## 2021-03-07 NOTE — Telephone Encounter (Signed)
Pt's husband called, current PT said can only do once a wk and that medicare will not cover 2 times a wk. Need Dr. Felecia Shelling to send a referral for 2 times a wk. [facility]  Attn toHardie Pulley Fax: [phone]   Would like a call from the nurse

## 2021-03-10 NOTE — Telephone Encounter (Signed)
Doug with [facility] called me and informed me he spoke with the patient and they are going to try and get her set up.. he stated they will be able to schedule her after her video visit with Dr. Felecia Shelling on 03/20/21.Marland Kitchen Marden Noble stated he spoke with the patient and her husband and went over everything with them.

## 2021-03-10 NOTE — Addendum Note (Signed)
Addended by: Wyvonnia Lora on: 03/10/2021 07:37 AM   Modules accepted: Orders

## 2021-03-10 NOTE — Telephone Encounter (Signed)
Left voicemail with Marden Noble who is with [facility] to see if he will be able to take this patient.

## 2021-03-20 ENCOUNTER — Telehealth (INDEPENDENT_AMBULATORY_CARE_PROVIDER_SITE_OTHER): Payer: Medicare Other | Admitting: Neurology

## 2021-03-20 ENCOUNTER — Encounter: Payer: Self-pay | Admitting: Neurology

## 2021-03-20 DIAGNOSIS — G35 Multiple sclerosis: Secondary | ICD-10-CM

## 2021-03-20 DIAGNOSIS — F09 Unspecified mental disorder due to known physiological condition: Secondary | ICD-10-CM

## 2021-03-20 DIAGNOSIS — R350 Frequency of micturition: Secondary | ICD-10-CM

## 2021-03-20 DIAGNOSIS — R261 Paralytic gait: Secondary | ICD-10-CM

## 2021-03-20 DIAGNOSIS — G801 Spastic diplegic cerebral palsy: Secondary | ICD-10-CM

## 2021-03-20 NOTE — Progress Notes (Signed)
[facility]  PATIENT: Taylor Hancock DOB: 05-16-1960  REFERRING CLINICIAN: Shirline Frees   HISTORY FROM: Patient  REASON FOR VISIT: PPMS   HISTORICAL  CHIEF COMPLAINT:    [facility]  PATIENT: Taylor Hancock DOB: January 22, 1960  REFERRING CLINICIAN: Shirline Frees   HISTORY FROM: Patient  REASON FOR VISIT: PPMS   HISTORICAL  CHIEF COMPLAINT:  No chief complaint on file.    HISTORY OF PRESENT ILLNESS:  [name] is a 61 year old woman with primary progressive multiple sclerosis diagnosed in Apr 25, 2003.     Update 07/30/2019: Virtual Visit via telephone note I connected with [name] on 03/20/21 at  3:30 PM EST by telephone and verified that I am speaking with the correct person.  I discussed the limitations of evaluation and management by telemedicine and the availability of in person appointments. The patient expressed understanding and agreed to proceed.  She has been unable to connect on video.  The patient connected while at home and the provider was at the office.  History of Present Illness: She has PPMS and is not on any DMT.   She is doing home PT.  Gait is poor.  She had a fall last week and was pinned between her furniture and EMS was called.   She has occasional falls.  Last year had  one leading to fractures in her left foot and ankle.     She has trouble standing and walking.  Gait is very variable.   With a walker she can usually cross a room but on a bad day goes less.  She went downhill with gait in 25-Apr-2019 after a fall and has not recovered near the point she used to be.   She has a power wheelchair now for indoors.    Her left leg is weaker than right.  For spasticity she takes clonazepam, flexeril and tizanidine (just taking at night due to sleepiness) with benefit.    She was unable to tolerate baclofen in the past.     She uses a bedside commode.   She has more incontinence now and wears Depends.  She has  urinary urgency.  Blair Dolphin works better than Financial planner.    She notes fatigue.  She sleeps well.     She was diagnosed with DM last year and lost weight.  She has adjusted her diet and is not smoking or drinking now.   BP is better with reduced weight.    Recent HgbA1c was 6.2 with change in weight and diet so she is not on any medication.    She has developed pain in her fingertips   She has breast cancer followed by Dr. Lindi Adie.  She has hypertension was 170/100 recently and resumed her BP medications  She was recently diagnosed with DM (glucose was 167 and HgbA1c was 6.9).   She had Covid-19 in April 25, 2018.    Her mother died of pulmonary fibrosis a couple weeks ago    MS History:  She had a [place] sign in the 1990's a few times and she felt her neck was weaker at that time.  However, gait was not affected at that time.  She was diagnosed with multiple sclerosis in 2003-04-25 after presenting with several years of worsening gait. Initially, she was diagnosed with relapsing remitting MS and was placed on Betaseron. She had difficulty tolerating Betaseron but did continue to take it. Her first MRI after the diagnosis was unchanged from her previous  one. A couple years later she transferred her care to me. After review of her time course of disease, it was apparent that she had a progressive MS and not a relapsing form of MS. Therefore, the Betaseron was discontinued. Due to extent of disability, Ocrelizumab was not used.  She remains off a DMT.    IMAGING MRI brain 12/13/2018 showed Moderate to severe atrophy, with progression since 2017.  White matter changes in the brain are stable since 2017. Noacute intracranial abnormality  MRI cervical spine 12/13/2018 showed stable spinal cord foci at C2-3, C4, and C5 similar to the prior study. No enhancing lesions.  REVIEW OF SYSTEMS:  Constitutional: No fevers, chills, sweats, or change in appetite.  Notes fatigue.   Has insomnia and sleepiness Eyes: No  visual changes, double vision.  Notes light sensitivity and eye pain Ear, nose and throat: No hearing loss, ear pain, nasal congestion, sore throat.    Cardiovascular: No chest pain, palpitations Respiratory:  No shortness of breath at rest or with exertion.   No wheezes GastrointestinaI: No nausea, vomiting, diarrhea, abdominal pain.  Occ fecal incontinence Genitourinary:  see above.    Musculoskeletal:  She has myalgias Integumentary: No rash, pruritus, skin lesions.   Edema in feet Neurological: as above Psychiatric: Notes depression and anxiety  Endocrine: No palpitations, diaphoresis, change in appetite, change in weigh or increased thirst Hematologic/Lymphatic:  No anemia, purpura, petechiae. Allergic/Immunologic: No itchy/runny eyes, nasal congestion, recent allergic reactions, rashes  ALLERGIES: Allergies  Allergen Reactions   Letrozole Other (See Comments)    Suicidal ideation   Levaquin [Levofloxacin In D5w]    Ppa-[facility] [Compleat] Nausea And Vomiting    Blood in vomit and back felt like it was breaking.    Zithromax [Azithromycin] Other (See Comments)    abd cramping   Latex Itching   Ultram [Tramadol Hcl] Anxiety    HOME MEDICATIONS: Outpatient Medications Prior to Visit  Medication Sig Dispense Refill   amLODipine (NORVASC) 5 MG tablet Take 5 mg by mouth daily.     ciprofloxacin (CIPRO) 500 MG tablet Take 500 mg by mouth 2 (two) times daily.     clonazePAM (KLONOPIN) 1 MG tablet TAKE 4 PILLS BY MOUTH AT BEDTIME 360 tablet 1   cyclobenzaprine (FLEXERIL) 5 MG tablet TAKE 1 TO 2 TABLETS BY MOUTH EVERY DAY AT BEDTIME 60 tablet 5   ketoconazole (NIZORAL) 2 % cream Apply 1 application topically every other day.     mirabegron ER (MYRBETRIQ) 50 MG TB24 tablet Take 1 tablet (50 mg total) by mouth daily. 30 tablet 11   pantoprazole (PROTONIX) 40 MG tablet Take 40 mg by mouth daily.     tiZANidine (ZANAFLEX) 4 MG tablet TAKE 1 TABLET IN THE MORNING, 1 TABLET IN THE  AFTERNOON AND 2 TABLETS AT BEDTIME. 360 tablet 1   triamcinolone cream (KENALOG) 0.1 % Apply 1 application topically 2 (two) times daily.     No facility-administered medications prior to visit.    PAST MEDICAL HISTORY: Past Medical History:  Diagnosis Date   Anxiety    Complication of anesthesia    patient was awake during colonoscopy as drugs were not effective-reports that propofol is an effective analgesic for her   Concussion    Depression    Family history of breast cancer    Family history of ovarian cancer    Family history of pancreatic cancer    Fibromyalgia    History of radiation therapy 05/20/17- 06/17/17   Left  Breast/ 40.05 Gy in 15 fractions, Left Breast Boost/ 10 Gy in 5 fractions.    Movement disorder    MS (multiple sclerosis) (HCC)    primary progressive MS   Multiple sclerosis ([place])    Neuropathy    Osteoporosis    Vision abnormalities     PAST SURGICAL HISTORY: Past Surgical History:  Procedure Laterality Date   BREAST LUMPECTOMY WITH RADIOACTIVE SEED AND SENTINEL LYMPH NODE BIOPSY Left 03/31/2017   Procedure: BREAST LUMPECTOMY WITH RADIOACTIVE SEED AND SENTINEL LYMPH NODE BIOPSY;  Surgeon: Stark Klein, MD;  Location: [place];  Service: General;  Laterality: Left;   FRACTURE SURGERY Left    hand   I & D EXTREMITY  05/30/2011   Procedure: IRRIGATION AND DEBRIDEMENT EXTREMITY;  Surgeon: Tennis Must, MD;  Location: [facility];  Service: Orthopedics;;  Incision and drainage of open Proximal interphalangeal joint with closed reduction of Proximal interphalangeal  joint left long finger   KNEE ARTHROSCOPY      FAMILY HISTORY: Family History  Problem Relation Age of Onset   Hypertension Mother    Breast cancer Mother 48   Hypothyroidism Mother    Hyperlipidemia Father    Diabetes Father    Pancreatic cancer Father 80   Hypothyroidism Sister    Ovarian cancer Maternal Aunt        dx in her 38s   Parkinson's disease Maternal Uncle    Stroke Maternal Uncle     Breast cancer Maternal Grandmother 100   Breast cancer Maternal Aunt        dx under 56   Breast cancer Maternal Aunt        dx over 50s   Breast cancer Maternal Aunt        dx >50   Breast cancer Other    Hypothyroidism Brother    Breast cancer Cousin    Breast cancer Sister         Observations/Objective:  She is alert and oriented with normal speech.    Assessment and Plan: Multiple sclerosis, primary progressive (HCC)  Spastic diplegia (HCC)  Spastic gait  Urinary frequency  Mild cognitive disorder   1.   She will remain off of a disease modifying therapy for her primary progressive MS.  Advised to eat a healthy diet and keep weight off  2.   Stay active and work with physical therapy.   3.   Continue medications for bladder dysfunction and spasticity. 4.    F/u in 6 months, sooner if new or worsening symptoms  Follow Up Instructions: I discussed the assessment and treatment plan with the patient. The patient was provided an opportunity to ask questions and all were answered. The patient agreed with the plan and demonstrated an understanding of the instructions.    The patient was advised to call back or seek an in-person evaluation if the symptoms worsen or if the condition fails to improve as anticipated.  25 minute office visit with the majority of the time communicating with patient virtually.      Vash Quezada A. Felecia Shelling, MD, PhD 03/27/1015, 5:10 PM Certified in Neurology, Clinical Neurophysiology, Sleep Medicine, Pain Medicine and Neuroimaging  [facility] [address] [place], [place] [address] [email or web address]

## 2021-03-27 ENCOUNTER — Telehealth: Payer: Self-pay | Admitting: Neurology

## 2021-03-27 NOTE — Telephone Encounter (Signed)
Called patient back.  Patient is frustrated continues to have difficulty with her movement and would like to continue therapy.  When she sees a home health group.  They always start out where they will come frequently throughout the week but then slowly they stop coming.  She has spoken with her Medicare company and they state that per her insurance she is allowed 28 hours a week of therapy.  She is having a hard time understanding why they are taking her down to 1 times per week or every other week.  I informed the patient that unfortunately all we can do is send the referrals to the company and the physical therapy is going to make the appropriate evaluations based off of how she is doing.  We will set goals and will work with the patient.  Informed the patient that she would need to discuss this with the physical therapy department that she is working with along with her insurance to understand why they are doing therapy more than what they are.  Patient verbalized understanding and will let us know if there is anything else needed from [place]. ?

## 2021-03-27 NOTE — Telephone Encounter (Signed)
Pt is asking for a call with the suggestion of Dr Felecia Shelling [id number] agency she is with she always generally starts with 2 times a week and is then taken down to once a week with a week skipped between.  Pt states this has been the case with various agencies she has been with.  Pt is asking for a call to discuss. ?

## 2021-04-01 ENCOUNTER — Encounter: Payer: Self-pay | Admitting: Neurology

## 2021-04-02 ENCOUNTER — Other Ambulatory Visit: Payer: Self-pay | Admitting: Family Medicine

## 2021-04-02 DIAGNOSIS — K769 Liver disease, unspecified: Secondary | ICD-10-CM

## 2021-04-04 NOTE — Telephone Encounter (Signed)
[place] Coralyn Mark) Need help with face to face note. Would like a call from the nurse ? ?Contact info: [phone] ?

## 2021-04-07 NOTE — Telephone Encounter (Signed)
Called Taylor Hancock back. There was no answer. Left a message advising that Dr. Felecia Shelling initially the visit was suppose to be a video visit but there was technical difficulties so the visit was completed as a telephone (audio) visit.  ?Advised she could call back with questions. ?Myriam Jacobson RN ?

## 2021-04-07 NOTE — Telephone Encounter (Signed)
Called back and spoke w/ Taylor Hancock. They reviewed last note from 03/20/21. Read where pt unable to connect to video. In order for insurance to accept note, it has to be audio and video. They are needing MD to confirm how visit was completed. Aware I will send to MD to review and call back. ?

## 2021-04-14 DIAGNOSIS — M24569 Contracture, unspecified knee: Secondary | ICD-10-CM | POA: Insufficient documentation

## 2021-04-16 ENCOUNTER — Encounter: Payer: Self-pay | Admitting: Neurology

## 2021-04-17 ENCOUNTER — Other Ambulatory Visit: Payer: Self-pay | Admitting: *Deleted

## 2021-04-17 ENCOUNTER — Telehealth: Payer: Self-pay | Admitting: Neurology

## 2021-04-17 DIAGNOSIS — R296 Repeated falls: Secondary | ICD-10-CM

## 2021-04-17 DIAGNOSIS — F09 Unspecified mental disorder due to known physiological condition: Secondary | ICD-10-CM

## 2021-04-17 DIAGNOSIS — R350 Frequency of micturition: Secondary | ICD-10-CM

## 2021-04-17 DIAGNOSIS — R261 Paralytic gait: Secondary | ICD-10-CM

## 2021-04-17 DIAGNOSIS — R269 Unspecified abnormalities of gait and mobility: Secondary | ICD-10-CM

## 2021-04-17 DIAGNOSIS — R5383 Other fatigue: Secondary | ICD-10-CM

## 2021-04-17 DIAGNOSIS — G35 Multiple sclerosis: Secondary | ICD-10-CM

## 2021-04-17 DIAGNOSIS — G801 Spastic diplegic cerebral palsy: Secondary | ICD-10-CM

## 2021-04-17 NOTE — Telephone Encounter (Signed)
Left voicemail with Marden Noble who is with [facility]. I gave him my call back number and email to add OT to this patient PT.  ?

## 2021-04-21 NOTE — Telephone Encounter (Signed)
Doug never returned my call, I sent him an email.  ?

## 2021-04-21 NOTE — Telephone Encounter (Signed)
Hello, ? ?I just wanted to let you aware that on Thursday 04/17/21 I left Doug with [facility]  a voicemail to call me back and I also sent him an email this morning and I still have not heard from him.. I am trying to get a hold of them to let them know to add the OT.  ? ?Thank you, ?Raquel Sarna  ?

## 2021-04-22 NOTE — Telephone Encounter (Signed)
Doug left me a vmail request an order to be put in.. I called him back and left him a vmail to inform him a order has been put in on 04/17/21 for OT in [place].  ?

## 2021-04-28 ENCOUNTER — Other Ambulatory Visit (HOSPITAL_COMMUNITY): Payer: Self-pay | Admitting: Family Medicine

## 2021-04-28 ENCOUNTER — Other Ambulatory Visit: Payer: Self-pay | Admitting: Family Medicine

## 2021-04-28 DIAGNOSIS — K769 Liver disease, unspecified: Secondary | ICD-10-CM

## 2021-05-15 ENCOUNTER — Telehealth: Payer: Self-pay | Admitting: Neurology

## 2021-05-15 NOTE — Telephone Encounter (Signed)
Taylor Hancock has called (ph [phone] updated fax# [phone]) she states re: notes from 03-20-21 under history and present illness there is a 07-30-19 note that re:the virtual visit  provider was only able to talk to pt on phone.  For Insurance purposes it needed to be a virtual visit.  Terri said at the end before signature there is a note that says majority of the visit was virtually.  The 7-11 entry needs to come off , it needs to say that the visit was both video and audio.  Taylor Hancock is available until 5pm and welcomes a call back.  ?

## 2021-05-19 NOTE — Telephone Encounter (Signed)
Called pt. Scheduled in office visit for 05/21/21 at 2p w/ Dr. Felecia Shelling. ?

## 2021-05-20 ENCOUNTER — Ambulatory Visit
Admission: RE | Admit: 2021-05-20 | Discharge: 2021-05-20 | Disposition: A | Discharge status: Home / Self Care | Payer: Medicare Other | Source: Ambulatory Visit | Attending: Family Medicine | Admitting: Family Medicine

## 2021-05-20 ENCOUNTER — Encounter (INDEPENDENT_AMBULATORY_CARE_PROVIDER_SITE_OTHER): Payer: Self-pay

## 2021-05-20 DIAGNOSIS — K769 Liver disease, unspecified: Secondary | ICD-10-CM

## 2021-05-20 MED ORDER — GADOBENATE DIMEGLUMINE 529 MG/ML IV SOLN
11.0000 mL | Freq: Once | INTRAVENOUS | Status: AC | PRN
  Administered 2021-05-20: 11 mL via INTRAVENOUS

## 2021-05-21 ENCOUNTER — Ambulatory Visit (INDEPENDENT_AMBULATORY_CARE_PROVIDER_SITE_OTHER): Payer: Medicare Other | Admitting: Neurology

## 2021-05-21 ENCOUNTER — Encounter: Payer: Self-pay | Admitting: Neurology

## 2021-05-21 VITALS — BP 122/76 | HR 63 | Ht 64.0 in | Wt 110.0 lb

## 2021-05-21 DIAGNOSIS — G35B0 Primary progressive multiple sclerosis, unspecified: Secondary | ICD-10-CM

## 2021-05-21 DIAGNOSIS — R261 Paralytic gait: Secondary | ICD-10-CM | POA: Diagnosis not present

## 2021-05-21 DIAGNOSIS — G801 Spastic diplegic cerebral palsy: Secondary | ICD-10-CM | POA: Diagnosis not present

## 2021-05-21 DIAGNOSIS — G35 Multiple sclerosis: Secondary | ICD-10-CM | POA: Diagnosis not present

## 2021-05-21 DIAGNOSIS — F09 Unspecified mental disorder due to known physiological condition: Secondary | ICD-10-CM | POA: Diagnosis not present

## 2021-05-21 DIAGNOSIS — R296 Repeated falls: Secondary | ICD-10-CM

## 2021-05-21 DIAGNOSIS — R350 Frequency of micturition: Secondary | ICD-10-CM

## 2021-05-21 NOTE — Progress Notes (Signed)
? ? ?[facility] ? ?PATIENT: Taylor Hancock ?DOB: 1960/05/31 ? ?REFERRING CLINICIAN: Shirline Frees   ?HISTORY FROM: Patient ? ?REASON FOR VISIT: PPMS ? ? ? ?HISTORICAL ? ?CHIEF COMPLAINT:  ?Chief Complaint  ?Patient presents with  ? Follow-up  ?  Pt with husband. Working with therapy and insurance is requiring the visit to get home health covered.   ? ? ? ?HISTORY OF PRESENT ILLNESS:  ?Taylor Hancock is a 61 year old woman with primary progressive multiple sclerosis diagnosed in 2005 here for Home health evaluation today.    ? ?Update 5/3//2023:  ?History of Present Illness: ?MS:   She has PPMS and is not on any DMT.  Due to her impairments, she is homebound ? ?Home Health Needs:   She needs home physical therapy 2-3 days days a week for gait, weakness and spasticity issues   She actively participates in therapy.     She also needs occupational therapy once a week to help improve ability to do independent ADLs.   She also needs assistance with bathing and some household chores,  ? ?Impairments:  Due to the MS causing leg weakness and spasticity, she has trouble standing and walking.  This as been a progressive issue and she is having more trouble performing ADLs.   She also has osteoarthritis.     With a walker she can only go 5 steps .  Gait had worsened after a fall in 2021 and never improved much.  She uses a power wheelchair now for indoors.    Her left leg is weaker than right and has a foot drop..  For spasticity she takes clonazepam, flexeril and tizanidine (just taking at night due to sleepiness) with benefit.    She was unable to tolerate baclofen in the past.      She uses a bedside commode.   She has some  incontinence now and wears Depends.  She has urinary urgency.  Blair Dolphin works better than Financial planner.   ? ?ADLs:    Due to limited mobility she has difficulty with many ADLs including bathing, meal prep, simple chores.    ? She has a power wheelchair and spends most of the  day in it.      ?  ?Other:  She has breast cancer followed by Dr. Lindi Adie.  She has hypertension was 170/100 recently and resumed her BP medications  She was recently diagnosed with DM (glucose was 167 and HgbA1c was 6.9).   She had Covid-19 in 2020.    ? ? ?MS History:  She had a Lhermite sign in the 1990's a few times and she felt her neck was weaker at that time.  However, gait was not affected at that time.  She was diagnosed with multiple sclerosis in 2005 after presenting with several years of worsening gait. Initially, she was diagnosed with relapsing remitting MS and was placed on Betaseron. She had difficulty tolerating Betaseron but did continue to take it. Her first MRI after the diagnosis was unchanged from her previous one. A couple years later she transferred her care to me. After review of her time course of disease, it was apparent that she had a progressive MS and not a relapsing form of MS. Therefore, the Betaseron was discontinued. Due to extent of disability, Ocrelizumab was not used.  She remains off a DMT.   ? ?IMAGING ?MRI brain 12/13/2018 showed Moderate to severe atrophy, with progression since 2017.  White matter changes in the brain  are stable since 2017. Noacute intracranial abnormality ? ?MRI cervical spine 12/13/2018 showed stable spinal cord foci at C2-3, C4, and C5 similar to the prior study. No enhancing lesions. ? ?REVIEW OF SYSTEMS:  ?Constitutional: No fevers, chills, sweats, or change in appetite.  Notes fatigue.   Has insomnia and sleepiness ?Eyes: No visual changes, double vision.  Notes light sensitivity and eye pain ?Ear, nose and throat: No hearing loss, ear pain, nasal congestion, sore throat.    ?Cardiovascular: No chest pain, palpitations ?Respiratory:  No shortness of breath at rest or with exertion.   No wheezes ?GastrointestinaI: No nausea, vomiting, diarrhea, abdominal pain.  Occ fecal incontinence ?Genitourinary:  see above.    ?Musculoskeletal:  She has  myalgias ?Integumentary: No rash, pruritus, skin lesions.   Edema in feet ?Neurological: as above ?Psychiatric: Notes depression and anxiety  ?Endocrine: No palpitations, diaphoresis, change in appetite, change in weigh or increased thirst ?Hematologic/Lymphatic:  No anemia, purpura, petechiae. ?Allergic/Immunologic: No itchy/runny eyes, nasal congestion, recent allergic reactions, rashes ? ?ALLERGIES: ?Allergies  ?Allergen Reactions  ? Letrozole Other (See Comments)  ?  Suicidal ideation  ? Levaquin [Levofloxacin In D5w]   ? Ppa-Mma Express [Compleat] Nausea And Vomiting  ?  Blood in vomit and back felt like it was breaking.   ? Zithromax [Azithromycin] Other (See Comments)  ?  abd cramping  ? Latex Itching  ? Ultram [Tramadol Hcl] Anxiety  ? ? ?HOME MEDICATIONS: ?Outpatient Medications Prior to Visit  ?Medication Sig Dispense Refill  ? amLODipine (NORVASC) 5 MG tablet Take 5 mg by mouth daily.    ? clonazePAM (KLONOPIN) 1 MG tablet TAKE 4 PILLS BY MOUTH AT BEDTIME 360 tablet 1  ? cyclobenzaprine (FLEXERIL) 5 MG tablet TAKE 1 TO 2 TABLETS BY MOUTH EVERY DAY AT BEDTIME 60 tablet 5  ? GEMTESA 75 MG TABS Take 1 tablet by mouth daily.    ? ketoconazole (NIZORAL) 2 % cream Apply 1 application topically every other day.    ? pantoprazole (PROTONIX) 40 MG tablet Take 40 mg by mouth daily.    ? tiZANidine (ZANAFLEX) 4 MG tablet TAKE 1 TABLET IN THE MORNING, 1 TABLET IN THE AFTERNOON AND 2 TABLETS AT BEDTIME. 360 tablet 1  ? triamcinolone cream (KENALOG) 0.1 % Apply 1 application topically 2 (two) times daily.    ? ciprofloxacin (CIPRO) 500 MG tablet Take 500 mg by mouth 2 (two) times daily.    ? mirabegron ER (MYRBETRIQ) 50 MG TB24 tablet Take 1 tablet (50 mg total) by mouth daily. 30 tablet 11  ? ?No facility-administered medications prior to visit.  ? ? ?PAST MEDICAL HISTORY: ?Past Medical History:  ?Diagnosis Date  ? Anxiety   ? Complication of anesthesia   ? patient was awake during colonoscopy as drugs were not  effective-reports that propofol is an effective analgesic for her  ? Concussion   ? Depression   ? Family history of breast cancer   ? Family history of ovarian cancer   ? Family history of pancreatic cancer   ? Fibromyalgia   ? History of radiation therapy 05/20/17- 06/17/17  ? Left Breast/ 40.05 Gy in 15 fractions, Left Breast Boost/ 10 Gy in 5 fractions.   ? Movement disorder   ? MS (multiple sclerosis) ([place])   ? primary progressive MS  ? Multiple sclerosis ([place])   ? Neuropathy   ? Osteoporosis   ? Vision abnormalities   ? ? ?PAST SURGICAL HISTORY: ?Past Surgical History:  ?Procedure Laterality Date  ?  BREAST LUMPECTOMY WITH RADIOACTIVE SEED AND SENTINEL LYMPH NODE BIOPSY Left 03/31/2017  ? Procedure: BREAST LUMPECTOMY WITH RADIOACTIVE SEED AND SENTINEL LYMPH NODE BIOPSY;  Surgeon: Stark Klein, MD;  Location: [facility];  Service: General;  Laterality: Left;  ? FRACTURE SURGERY Left   ? hand  ? I & D EXTREMITY  05/30/2011  ? Procedure: IRRIGATION AND DEBRIDEMENT EXTREMITY;  Surgeon: Tennis Must, MD;  Location: [facility];  Service: Orthopedics;;  Incision and drainage of open Proximal interphalangeal joint with closed reduction of Proximal interphalangeal  joint left long finger  ? KNEE ARTHROSCOPY    ? ? ?FAMILY HISTORY: ?Family History  ?Problem Relation Age of Onset  ? Hypertension Mother   ? Breast cancer Mother 33  ? Hypothyroidism Mother   ? Hyperlipidemia Father   ? Diabetes Father   ? Pancreatic cancer Father 90  ? Hypothyroidism Sister   ? Ovarian cancer Maternal Aunt   ?     dx in her 83s  ? Parkinson's disease Maternal Uncle   ? Stroke Maternal Uncle   ? Breast cancer Maternal Grandmother 100  ? Breast cancer Maternal Aunt   ?     dx under 80  ? Breast cancer Maternal Aunt   ?     dx over 44s  ? Breast cancer Maternal Aunt   ?     dx >50  ? Breast cancer Other   ? Hypothyroidism Brother   ? Breast cancer Cousin   ? Breast cancer Sister   ? ? ? ? ? ? ?PHYSICAL EXAM ? ?Today's Vitals  ? 05/21/21 1355  ?BP: 122/76   ?Pulse: 63  ?Weight: 110 lb (49.9 kg)  ?Height: '5\' 4"'$  (1.626 m)  ? ?Body mass index is 18.88 kg/m?.  ? ? ?General: The patient is well-developed and well-nourished and in mild distress. ?  ?Neurologic Exam ?  ?Mental s

## 2021-06-03 ENCOUNTER — Other Ambulatory Visit: Payer: Self-pay | Admitting: Neurology

## 2021-06-05 ENCOUNTER — Other Ambulatory Visit: Payer: Self-pay | Admitting: Family Medicine

## 2021-06-05 DIAGNOSIS — N6320 Unspecified lump in the left breast, unspecified quadrant: Secondary | ICD-10-CM

## 2021-06-18 ENCOUNTER — Telehealth: Payer: Self-pay | Admitting: Neurology

## 2021-06-18 ENCOUNTER — Ambulatory Visit
Admission: RE | Admit: 2021-06-18 | Discharge: 2021-06-18 | Disposition: A | Discharge status: Home / Self Care | Payer: Medicare Other | Source: Ambulatory Visit | Attending: Family Medicine | Admitting: Family Medicine

## 2021-06-18 ENCOUNTER — Ambulatory Visit: Admission: RE | Admit: 2021-06-18 | Payer: Medicare Other | Source: Ambulatory Visit

## 2021-06-18 DIAGNOSIS — G35 Multiple sclerosis: Secondary | ICD-10-CM

## 2021-06-18 DIAGNOSIS — F09 Unspecified mental disorder due to known physiological condition: Secondary | ICD-10-CM

## 2021-06-18 DIAGNOSIS — R5383 Other fatigue: Secondary | ICD-10-CM

## 2021-06-18 DIAGNOSIS — R296 Repeated falls: Secondary | ICD-10-CM

## 2021-06-18 DIAGNOSIS — N6320 Unspecified lump in the left breast, unspecified quadrant: Secondary | ICD-10-CM

## 2021-06-18 DIAGNOSIS — G801 Spastic diplegic cerebral palsy: Secondary | ICD-10-CM

## 2021-06-18 DIAGNOSIS — R269 Unspecified abnormalities of gait and mobility: Secondary | ICD-10-CM

## 2021-06-18 DIAGNOSIS — R261 Paralytic gait: Secondary | ICD-10-CM

## 2021-06-18 DIAGNOSIS — R350 Frequency of micturition: Secondary | ICD-10-CM

## 2021-06-18 HISTORY — DX: Personal history of irradiation: Z92.3

## 2021-06-18 HISTORY — DX: Malignant neoplasm of unspecified site of unspecified female breast: C50.919

## 2021-06-18 NOTE — Telephone Encounter (Signed)
[facility] Coralyn Mark) Would like a call from the nurse to discuss about a upcoming discharging pt from home health.

## 2021-06-18 NOTE — Telephone Encounter (Signed)
Called Terry back. She reports she had F2F 03/20/21 but unclear if video/telephone visit. Advised we confirmed it was telephone w/ Dr. Felecia Shelling and that F2F needed. They were not aware they said. Advised that is why we brought her back in on 05/21/21 for in person visit/she did not feel she could do video. She states pt started therapy 03/14/21. Needed visit within 30days of that. They are having to write off charges. They are d/c'ing pt.  Asked if we could send another referral and have them use 05/21/21. She states she will have to speak with manager about this. I placed referral.  I also faxed most recent note to them at [phone]. Received fax confirmation.

## 2021-06-19 ENCOUNTER — Telehealth: Payer: Self-pay | Admitting: Neurology

## 2021-06-19 NOTE — Telephone Encounter (Signed)
[place] with [facility] will be helping this patient:  Yes, we can accept however she is still active with [facility]. She wants to switch but is needing to wait until a piece of equipment they ordered her comes in. Apparently, the OT told her they would put it together for her. She is supposed to call us tomorrow to give [place] an estimated dc after speaking with OT. All other disciplines have discharged at this time. Being that said we may need a new order depending on what she tells [place] tomorrow.

## 2021-06-20 ENCOUNTER — Encounter: Payer: Self-pay | Admitting: Neurology

## 2021-06-23 ENCOUNTER — Other Ambulatory Visit: Payer: Self-pay | Admitting: *Deleted

## 2021-06-23 MED ORDER — CLONAZEPAM 1 MG PO TABS: ORAL_TABLET | ORAL | 1 refills | Status: DC

## 2021-06-26 ENCOUNTER — Encounter: Payer: Self-pay | Admitting: Neurology

## 2021-07-09 ENCOUNTER — Telehealth: Payer: Self-pay | Admitting: Neurology

## 2021-07-09 NOTE — Telephone Encounter (Signed)
Called and spoke w/ [employer]. States pt only wanted initial eval and discharge visit for nursing. Provided VO. Nothing further needed.

## 2021-07-09 NOTE — Telephone Encounter (Signed)
Amber, RN w/[facility] requesting vo for twice a month nursing visits only.  Amber's vm is secure

## 2021-07-10 ENCOUNTER — Telehealth: Payer: Self-pay | Admitting: Neurology

## 2021-07-10 ENCOUNTER — Encounter: Payer: Self-pay | Admitting: Neurology

## 2021-07-10 NOTE — Telephone Encounter (Signed)
Taylor Hancock,PT w/ [facility] has called for vo for PT  1 week 1 2 week 8 Taylor Hancock's vm is secure

## 2021-07-10 NOTE — Telephone Encounter (Signed)
Called back and spoke w/ Ellard Artis. Provided below VO as requested, nothing further needed.

## 2021-08-14 ENCOUNTER — Encounter: Payer: Self-pay | Admitting: Neurology

## 2021-08-14 ENCOUNTER — Other Ambulatory Visit: Payer: Self-pay | Admitting: Family Medicine

## 2021-08-14 DIAGNOSIS — R109 Unspecified abdominal pain: Secondary | ICD-10-CM

## 2021-08-25 ENCOUNTER — Other Ambulatory Visit: Payer: Self-pay | Admitting: Family Medicine

## 2021-08-25 ENCOUNTER — Encounter: Payer: Self-pay | Admitting: Neurology

## 2021-08-25 ENCOUNTER — Ambulatory Visit
Admission: RE | Admit: 2021-08-25 | Discharge: 2021-08-25 | Disposition: A | Discharge status: Home / Self Care | Payer: Medicare Other | Source: Ambulatory Visit | Attending: Family Medicine | Admitting: Family Medicine

## 2021-08-25 DIAGNOSIS — M549 Dorsalgia, unspecified: Secondary | ICD-10-CM

## 2021-08-25 DIAGNOSIS — R109 Unspecified abdominal pain: Secondary | ICD-10-CM

## 2021-08-25 DIAGNOSIS — M533 Sacrococcygeal disorders, not elsewhere classified: Secondary | ICD-10-CM

## 2021-09-30 ENCOUNTER — Ambulatory Visit: Payer: Medicare Other | Admitting: Neurology

## 2021-11-06 ENCOUNTER — Other Ambulatory Visit: Payer: Self-pay | Admitting: Neurology

## 2021-12-02 ENCOUNTER — Other Ambulatory Visit: Payer: Self-pay | Admitting: Neurology

## 2021-12-15 ENCOUNTER — Encounter: Payer: Self-pay | Admitting: Neurology

## 2021-12-15 ENCOUNTER — Ambulatory Visit (INDEPENDENT_AMBULATORY_CARE_PROVIDER_SITE_OTHER): Payer: Medicare Other | Admitting: Neurology

## 2021-12-15 VITALS — BP 96/62 | HR 58

## 2021-12-15 DIAGNOSIS — G801 Spastic diplegic cerebral palsy: Secondary | ICD-10-CM | POA: Diagnosis not present

## 2021-12-15 DIAGNOSIS — G35 Multiple sclerosis: Secondary | ICD-10-CM | POA: Diagnosis not present

## 2021-12-15 DIAGNOSIS — G35B0 Primary progressive multiple sclerosis, unspecified: Secondary | ICD-10-CM

## 2021-12-15 DIAGNOSIS — R296 Repeated falls: Secondary | ICD-10-CM | POA: Diagnosis not present

## 2021-12-15 DIAGNOSIS — R261 Paralytic gait: Secondary | ICD-10-CM

## 2021-12-15 DIAGNOSIS — R350 Frequency of micturition: Secondary | ICD-10-CM

## 2021-12-15 MED ORDER — CLONAZEPAM 1 MG PO TABS: ORAL_TABLET | ORAL | 1 refills | Status: DC

## 2021-12-15 MED ORDER — CYCLOBENZAPRINE HCL 5 MG PO TABS: ORAL_TABLET | ORAL | 3 refills | Status: DC

## 2021-12-15 NOTE — Progress Notes (Addendum)
[facility]  PATIENT: Taylor Hancock DOB: 08-Jul-1960  REFERRING CLINICIAN: Shirline Frees   HISTORY FROM: Patient  REASON FOR VISIT: PPMS    HISTORICAL  CHIEF COMPLAINT:  Chief Complaint  Patient presents with   Follow-up    RM 2, with husband. C/o more spasticity. Needs refill on klonopin.     HISTORY OF PRESENT ILLNESS:  Taylor Hancock is a 61 year old woman with primary progressive multiple sclerosis diagnosed in 2005   Update 11/27//2023:  MS:   She has PPMS and is not on any DMT.  Due to her impairments, she is homebound   She is doing PT [facility] 2 hours (OT once a week and PT twice a week).    She feels it has been more beneficial than Home PT/OT   Due to the MS causing leg weakness and spasticity, she has trouble standing and walking.     With a walker she can only go 5 steps  .She can transfer well (arm strength is good).    She uses a power wheelchair now for indoors.    Her left leg is weaker than right and has a foot drop.. She has spasticity and  takes clonazepam, flexeril and tizanidine (just taking at night or prn due to sleepiness) with benefit.    She was unable to tolerate baclofen in the past.      She uses a bedside commode.   She has some  incontinence now and wears Depends.  She has urinary urgency.  Gemtasa helped better than Vesicare or Myrbetriq.    She is now off as trying pelvic tilt exercises.     Other:  She has breast cancer followed by Dr. Lindi Adie.  She had  hypertension in the past but now is off med's and BP was actually milly low today.   She was recently diagnosed with DM (glucose was 167 and HgbA1c was 6.9).   With weight loss sugars are now fine.        MS History:  She had a Lhermite sign in the 1990's a few times and she felt her neck was weaker at that time.  However, gait was not affected at that time.  She was diagnosed with multiple sclerosis in 2005 after presenting with several years of worsening gait.  Initially, she was diagnosed with relapsing remitting MS and was placed on Betaseron. She had difficulty tolerating Betaseron but did continue to take it. Her first MRI after the diagnosis was unchanged from her previous one. A couple years later she transferred her care to me. After review of her time course of disease, it was apparent that she had a progressive MS and not a relapsing form of MS. Therefore, the Betaseron was discontinued. Due to extent of disability, Ocrelizumab was not used.  She remains off a DMT.    IMAGING MRI brain 12/13/2018 showed Moderate to severe atrophy, with progression since 2017.  White matter changes in the brain are stable since 2017. Noacute intracranial abnormality  MRI cervical spine 12/13/2018 showed stable spinal cord foci at C2-3, C4, and C5 similar to the prior study. No enhancing lesions.  REVIEW OF SYSTEMS:  Constitutional: No fevers, chills, sweats, or change in appetite.  Notes fatigue.   Has insomnia and sleepiness Eyes: No visual changes, double vision.  Notes light sensitivity and eye pain Ear, nose and throat: No hearing loss, ear pain, nasal congestion, sore throat.    Cardiovascular: No chest pain, palpitations Respiratory:  No shortness of breath at rest or with exertion.   No wheezes GastrointestinaI: No nausea, vomiting, diarrhea, abdominal pain.  Occ fecal incontinence Genitourinary:  see above.    Musculoskeletal:  She has myalgias Integumentary: No rash, pruritus, skin lesions.   Edema in feet Neurological: as above Psychiatric: Notes depression and anxiety  Endocrine: No palpitations, diaphoresis, change in appetite, change in weigh or increased thirst Hematologic/Lymphatic:  No anemia, purpura, petechiae. Allergic/Immunologic: No itchy/runny eyes, nasal congestion, recent allergic reactions, rashes  ALLERGIES: Allergies  Allergen Reactions   Letrozole Other (See Comments)    Suicidal ideation   Levaquin [Levofloxacin In D5w]     Ppa-Mma Express [Compleat] Nausea And Vomiting    Blood in vomit and back felt like it was breaking.    Zithromax [Azithromycin] Other (See Comments)    abd cramping   Latex Itching   Ultram [Tramadol Hcl] Anxiety    HOME MEDICATIONS: Outpatient Medications Prior to Visit  Medication Sig Dispense Refill   GEMTESA 75 MG TABS Take 1 tablet by mouth daily.     ketoconazole (NIZORAL) 2 % cream Apply 1 application topically every other day.     pantoprazole (PROTONIX) 40 MG tablet Take 40 mg by mouth daily.     tiZANidine (ZANAFLEX) 4 MG tablet TAKE 1 TABLET IN THE MORNING, 1 TABLET IN THE AFTERNOON AND 2 TABLETS AT BEDTIME. 360 tablet 1   triamcinolone cream (KENALOG) 0.1 % Apply 1 application topically 2 (two) times daily.     clonazePAM (KLONOPIN) 1 MG tablet TAKE 4 PILLS BY MOUTH AT BEDTIME 360 tablet 1   cyclobenzaprine (FLEXERIL) 5 MG tablet TAKE 1 TO 2 TABLETS BY MOUTH EVERY DAY AT BEDTIME (Patient taking differently: Take 12.5 mg by mouth daily.) 60 tablet 5   amLODipine (NORVASC) 5 MG tablet Take 5 mg by mouth daily.     No facility-administered medications prior to visit.    PAST MEDICAL HISTORY: Past Medical History:  Diagnosis Date   Anxiety    Breast cancer ([place])    Complication of anesthesia    patient was awake during colonoscopy as drugs were not effective-reports that propofol is an effective analgesic for her   Concussion    Depression    Family history of breast cancer    Family history of ovarian cancer    Family history of pancreatic cancer    Fibromyalgia    History of radiation therapy 05/20/17- 06/17/17   Left Breast/ 40.05 Gy in 15 fractions, Left Breast Boost/ 10 Gy in 5 fractions.    Movement disorder    MS (multiple sclerosis) ([place])    primary progressive MS   Multiple sclerosis ([place])    Neuropathy    Osteoporosis    Personal history of radiation therapy    Vision abnormalities     PAST SURGICAL HISTORY: Past Surgical History:  Procedure  Laterality Date   BREAST LUMPECTOMY     BREAST LUMPECTOMY WITH RADIOACTIVE SEED AND SENTINEL LYMPH NODE BIOPSY Left 03/31/2017   Procedure: BREAST LUMPECTOMY WITH RADIOACTIVE SEED AND SENTINEL LYMPH NODE BIOPSY;  Surgeon: Stark Klein, MD;  Location: [facility];  Service: General;  Laterality: Left;   FRACTURE SURGERY Left    hand   I & D EXTREMITY  05/30/2011   Procedure: IRRIGATION AND DEBRIDEMENT EXTREMITY;  Surgeon: Tennis Must, MD;  Location: [facility];  Service: Orthopedics;;  Incision and drainage of open Proximal interphalangeal joint with closed reduction of Proximal interphalangeal  joint left long finger  KNEE ARTHROSCOPY      FAMILY HISTORY: Family History  Problem Relation Age of Onset   Hypertension Mother    Breast cancer Mother 55   Hypothyroidism Mother    Hyperlipidemia Father    Diabetes Father    Pancreatic cancer Father 62   Hypothyroidism Sister    Ovarian cancer Maternal Aunt        dx in her 19s   Parkinson's disease Maternal Uncle    Stroke Maternal Uncle    Breast cancer Maternal Grandmother 100   Breast cancer Maternal Aunt        dx under 29   Breast cancer Maternal Aunt        dx over 70s   Breast cancer Maternal Aunt        dx >50   Breast cancer Other    Hypothyroidism Brother    Breast cancer Cousin    Breast cancer Sister         PHYSICAL EXAM  Today's Vitals   12/15/21 0942  BP: 96/62  Pulse: (!) 58   There is no height or weight on file to calculate BMI.    General: The patient is well-developed and well-nourished and in mild distress.   Neurologic Exam   Mental status: The patient is alert and oriented x 3 at the time of the examination. She has reduced concentration and had trouble coming up with words at times.     Cranial nerves: Extraocular movements are full.    Facial strength and sensation is normal. Trapezius strength is norma Palatal elevation. Tongue protrusion is midline. Hearing is symmetrically    Motor:  Muscle  bulk is normal. She has moderately severe left and mild right increased tone. Strength is 4/5 in the right leg and 21 to 2/5 in left leg   Sensory: She has reduced vibration in her left leg.  Touch is more symmetric   Coordination: Cerebellar testing reveals reduced left left finger-nose-finger.   She has reduced heel-to-shin on the right and isunable to do on her left   Gait and station: Station is unstable and requires bilateral support.     Reflexes: Deep tendon reflexes are symmetric and normal in the arms but she has increased reflexes in both legs. Left > right, she has nonsustained clonus at the ankles, worse on the left.   Assessment and Plan: Multiple sclerosis, primary progressive (HCC)  Spastic diplegia (HCC)  Spastic gait  Frequent falls  Urinary frequency   1.   Continue PT, OT and home aide aide services. 2.   She will remain off of a disease modifying therapy for her primary progressive MS.  Advised to eat a healthy diet and keep weight off .  We discussed tolebrutinib in phase 3 trials.  2.   Stay active and work with physical therapy.   3.  If pelvic tilt exercises do not help, then resume medications for bladder dysfunction and spasticity. 4.    Gait is very poor.  She can only take a couple steps with a walker and needs to rely on her wheelchair more.    We went over safety F/u in 6 months, sooner if new or worsening symptoms   40-minute office visit with the majority of the time spent face-to-face for history and physical, discussion/counseling and decision-making.  Additional time with record review and documentation.   Iza Preston A. Felecia Shelling, MD, PhD 00/86/7619, 50:93 AM Certified in Neurology, Clinical Neurophysiology, Sleep Medicine, Pain Medicine and Neuroimaging  [facility] [address] [place], [place] [address] [phone]

## 2022-01-08 ENCOUNTER — Encounter: Payer: Self-pay | Admitting: *Deleted

## 2022-01-08 ENCOUNTER — Other Ambulatory Visit: Payer: Self-pay | Admitting: *Deleted

## 2022-01-08 ENCOUNTER — Encounter: Payer: Self-pay | Admitting: Neurology

## 2022-01-08 DIAGNOSIS — G35B0 Primary progressive multiple sclerosis, unspecified: Secondary | ICD-10-CM

## 2022-01-08 DIAGNOSIS — G801 Spastic diplegic cerebral palsy: Secondary | ICD-10-CM

## 2022-01-08 DIAGNOSIS — R296 Repeated falls: Secondary | ICD-10-CM

## 2022-01-08 DIAGNOSIS — R261 Paralytic gait: Secondary | ICD-10-CM

## 2022-01-08 DIAGNOSIS — G35 Multiple sclerosis: Secondary | ICD-10-CM

## 2022-01-08 DIAGNOSIS — R269 Unspecified abnormalities of gait and mobility: Secondary | ICD-10-CM

## 2022-01-08 NOTE — Telephone Encounter (Signed)
Faxed letter to number provided, received fax confirmation.

## 2022-02-13 DIAGNOSIS — M79674 Pain in right toe(s): Secondary | ICD-10-CM | POA: Insufficient documentation

## 2022-05-04 ENCOUNTER — Telehealth: Payer: Self-pay | Admitting: Neurology

## 2022-05-04 NOTE — Telephone Encounter (Signed)
Pt called stated she wants to talk to nurse about rehab. Stated where she was going they told her she would have to take a month off. Pt stated she needs this rehab.

## 2022-05-04 NOTE — Telephone Encounter (Signed)
Called pt back to get more info. Last week, went to [facility]. She warms up on bike typically. Front desk staff came over to her and told her they had to make some changes. Unable to get on bike unsupervised. She was told as long as therapist in room, she could transfer onto bike herself and ride bike. She then was told by Audra/therapist, new employee that since they have been seeing her since October, unable to see her past April. She can change to cnce a week or skip a whole month and start back up . This is due to threshold with Medicare. She called Medicare and they told her that was incorrect info. There was no threshold.   She feels she is moving better, able to stand some now after therapy.She emailed [facility] with info that she got from Medicare. She is concerned about taking a break and declining. Advised it would be best for her to call Audra back and discuss what info she found out from Medicare to see if that changes anything. She will do this.

## 2022-05-13 NOTE — Telephone Encounter (Signed)
Pt has called to report that she is changing rehab facilities.  She will be going to [facility] ph [phone] fax [phone] Pt is asking for a call to discuss

## 2022-05-14 ENCOUNTER — Other Ambulatory Visit: Payer: Self-pay

## 2022-05-14 ENCOUNTER — Telehealth: Payer: Self-pay | Admitting: Neurology

## 2022-05-14 DIAGNOSIS — R296 Repeated falls: Secondary | ICD-10-CM

## 2022-05-14 DIAGNOSIS — G801 Spastic diplegic cerebral palsy: Secondary | ICD-10-CM

## 2022-05-14 DIAGNOSIS — G35 Multiple sclerosis: Secondary | ICD-10-CM

## 2022-05-14 DIAGNOSIS — R269 Unspecified abnormalities of gait and mobility: Secondary | ICD-10-CM

## 2022-05-14 NOTE — Telephone Encounter (Signed)
LVM for pt to call back before pm today to discuss her phone call.

## 2022-05-14 NOTE — Telephone Encounter (Signed)
Called pt and let her know that her Referral for her Physical Therapy has been [facility].

## 2022-05-14 NOTE — Telephone Encounter (Signed)
Pt called stating that she was letting us know that she was in the process of switching Physical Therapy to [facility] and was needing a referral. Sent in Verbal Referral per Dr. Epimenio Foot on 05/14/2022.

## 2022-05-14 NOTE — Telephone Encounter (Signed)
Physical therapy referral faxed to [facility]) fax# [phone], phone# [phone]

## 2022-05-22 DIAGNOSIS — R269 Unspecified abnormalities of gait and mobility: Secondary | ICD-10-CM | POA: Insufficient documentation

## 2022-05-22 DIAGNOSIS — G35D Multiple sclerosis, unspecified: Secondary | ICD-10-CM | POA: Insufficient documentation

## 2022-05-25 ENCOUNTER — Other Ambulatory Visit: Payer: Self-pay | Admitting: Family Medicine

## 2022-05-25 DIAGNOSIS — R932 Abnormal findings on diagnostic imaging of liver and biliary tract: Secondary | ICD-10-CM

## 2022-05-28 ENCOUNTER — Other Ambulatory Visit: Payer: Self-pay | Admitting: Neurology

## 2022-06-18 ENCOUNTER — Encounter: Payer: Self-pay | Admitting: Neurology

## 2022-06-18 ENCOUNTER — Ambulatory Visit (INDEPENDENT_AMBULATORY_CARE_PROVIDER_SITE_OTHER): Payer: Medicare Other | Admitting: Neurology

## 2022-06-18 VITALS — BP 127/74 | HR 71

## 2022-06-18 DIAGNOSIS — G35B0 Primary progressive multiple sclerosis, unspecified: Secondary | ICD-10-CM

## 2022-06-18 DIAGNOSIS — R296 Repeated falls: Secondary | ICD-10-CM

## 2022-06-18 DIAGNOSIS — R350 Frequency of micturition: Secondary | ICD-10-CM

## 2022-06-18 DIAGNOSIS — G801 Spastic diplegic cerebral palsy: Secondary | ICD-10-CM | POA: Diagnosis not present

## 2022-06-18 DIAGNOSIS — R5383 Other fatigue: Secondary | ICD-10-CM

## 2022-06-18 DIAGNOSIS — R269 Unspecified abnormalities of gait and mobility: Secondary | ICD-10-CM | POA: Diagnosis not present

## 2022-06-18 DIAGNOSIS — G35 Multiple sclerosis: Secondary | ICD-10-CM | POA: Diagnosis not present

## 2022-06-18 MED ORDER — CYCLOBENZAPRINE HCL 5 MG PO TABS: ORAL_TABLET | ORAL | 3 refills | Status: DC

## 2022-06-18 MED ORDER — CLONAZEPAM 1 MG PO TABS: ORAL_TABLET | ORAL | 1 refills | Status: DC

## 2022-06-18 NOTE — Addendum Note (Signed)
Addended by: Despina Arias A on: 06/18/2022 07:08 PM   Modules accepted: Level of Service

## 2022-06-18 NOTE — Progress Notes (Addendum)
[facility]  PATIENT: Taylor Hancock DOB: 09-05-1960  REFERRING CLINICIAN: Johny Blamer   HISTORY FROM: Patient  REASON FOR VISIT: PPMS    HISTORICAL  CHIEF COMPLAINT:  Chief Complaint  Patient presents with   Room 11    Pt is here Alone. Pt states that she a bulging in her left stomach that she is going to have surgery on. Pt states that she wants to talk about her recent diagnosis of scoliosis in her hip.      HISTORY OF PRESENT ILLNESS:  Taylor Hancock is a 62 year old woman with primary progressive multiple sclerosis diagnosed in 2005   Update 06/18/2022: MS:   She has PPMS and is not on any DMT.  Due to her impairments, she is homebound   She is doing PT [facility] 2 hours (OT once a week and PT twice a week) and ow  [facility].   She feels it has been more beneficial than Home PT/OT.   She may be getting a brace with a spring cord to help the foot drop.   She uses s exercise equipment.  When she does not use Rehab she quickly does worse again.     Due to the MS causing leg weakness and spasticity, she has trouble standing and walking.   She has had more scoliosis and using a walker is difficult - can't really take steps.   However, she can transfer well (arm strength is good).    She uses a power wheelchair now for indoors and scooter outdoors.   She has less trunk strength.    Her left leg is weaker than right  . She also has spasticity and  takes clonazepam 4 mg qHS, flexeril and tizanidine qHS with benefit.    She was unable to tolerate baclofen in the past.      She uses a bedside commode.   She has some  incontinence now and wears Depends.  She has urinary urgency.  Gemtasa helps some.    She is now off as trying pelvic tilt exercises.     Other:  She has breast cancer followed by Dr. Pamelia Hoit.  She had  hypertension in the past but now is off med's and BP was actually milly low today.   She was recently diagnosed with DM (glucose was 167  and HgbA1c was 6.9).   She lost  weight and sugars are reportedly much better   MS History:  She had a [place] sign in the 1990's a few times and she felt her neck was weaker at that time.  However, gait was not affected at that time.  She was diagnosed with multiple sclerosis in 2005 after presenting with several years of worsening gait. Initially, she was diagnosed with relapsing remitting MS and was placed on Betaseron. She had difficulty tolerating Betaseron but did continue to take it. Her first MRI after the diagnosis was unchanged from her previous one. A couple years later she transferred her care to me. After review of her time course of disease, it was apparent that she had a progressive MS and not a relapsing form of MS. Therefore, the Betaseron was discontinued. Due to extent of disability, Ocrelizumab was not used.  She remains off a DMT.    IMAGING MRI brain 12/13/2018 showed Moderate to severe atrophy, with progression since 2017.  White matter changes in the brain are stable since 2017. Noacute intracranial abnormality  MRI cervical spine 12/13/2018 showed stable  spinal cord foci at C2-3, C4, and C5 similar to the prior study. No enhancing lesions.  REVIEW OF SYSTEMS:  Constitutional: No fevers, chills, sweats, or change in appetite.  Notes fatigue.   Has insomnia and sleepiness Eyes: No visual changes, double vision.  Notes light sensitivity and eye pain Ear, nose and throat: No hearing loss, ear pain, nasal congestion, sore throat.    Cardiovascular: No chest pain, palpitations Respiratory:  No shortness of breath at rest or with exertion.   No wheezes GastrointestinaI: No nausea, vomiting, diarrhea, abdominal pain.  Occ fecal incontinence Genitourinary:  see above.    Musculoskeletal:  She has myalgias Integumentary: No rash, pruritus, skin lesions.   Edema in feet Neurological: as above Psychiatric: Notes depression and anxiety  Endocrine: No palpitations, diaphoresis,  change in appetite, change in weigh or increased thirst Hematologic/Lymphatic:  No anemia, purpura, petechiae. Allergic/Immunologic: No itchy/runny eyes, nasal congestion, recent allergic reactions, rashes  ALLERGIES: Allergies  Allergen Reactions   Letrozole Other (See Comments)    Suicidal ideation   Levaquin [Levofloxacin In D5w]    Ppa-Mma Express [Compleat] Nausea And Vomiting    Blood in vomit and back felt like it was breaking.    Zithromax [Azithromycin] Other (See Comments)    abd cramping   Latex Itching   Ultram [Tramadol Hcl] Anxiety    HOME MEDICATIONS: Outpatient Medications Prior to Visit  Medication Sig Dispense Refill   GEMTESA 75 MG TABS Take 1 tablet by mouth daily.     ketoconazole (NIZORAL) 2 % cream Apply 1 application topically every other day.     pantoprazole (PROTONIX) 40 MG tablet Take 40 mg by mouth daily.     tiZANidine (ZANAFLEX) 4 MG tablet TAKE 1 TABLET IN THE MORNING, 1 TABLET IN THE AFTERNOON AND 2 TABLETS AT BEDTIME. 360 tablet 1   triamcinolone cream (KENALOG) 0.1 % Apply 1 application topically 2 (two) times daily.     clonazePAM (KLONOPIN) 1 MG tablet TAKE 4 PILLS BY MOUTH AT BEDTIME 360 tablet 1   cyclobenzaprine (FLEXERIL) 5 MG tablet One or two po QHS 180 tablet 3   No facility-administered medications prior to visit.    PAST MEDICAL HISTORY: Past Medical History:  Diagnosis Date   Anxiety    Breast cancer (HCC)    Complication of anesthesia    patient was awake during colonoscopy as drugs were not effective-reports that propofol is an effective analgesic for her   Concussion    Depression    Family history of breast cancer    Family history of ovarian cancer    Family history of pancreatic cancer    Fibromyalgia    History of radiation therapy 05/20/17- 06/17/17   Left Breast/ 40.05 Gy in 15 fractions, Left Breast Boost/ 10 Gy in 5 fractions.    Movement disorder    MS (multiple sclerosis) (HCC)    primary progressive MS    Multiple sclerosis (HCC)    Neuropathy    Osteoporosis    Personal history of radiation therapy    Vision abnormalities     PAST SURGICAL HISTORY: Past Surgical History:  Procedure Laterality Date   BREAST LUMPECTOMY     BREAST LUMPECTOMY WITH RADIOACTIVE SEED AND SENTINEL LYMPH NODE BIOPSY Left 03/31/2017   Procedure: BREAST LUMPECTOMY WITH RADIOACTIVE SEED AND SENTINEL LYMPH NODE BIOPSY;  Surgeon: Almond Lint, MD;  Location: [facility] OR;  Service: General;  Laterality: Left;   FRACTURE SURGERY Left    hand   I &  D EXTREMITY  05/30/2011   Procedure: IRRIGATION AND DEBRIDEMENT EXTREMITY;  Surgeon: Tami Ribas, MD;  Location: [facility] ORS;  Service: Orthopedics;;  Incision and drainage of open Proximal interphalangeal joint with closed reduction of Proximal interphalangeal  joint left long finger   KNEE ARTHROSCOPY      FAMILY HISTORY: Family History  Problem Relation Age of Onset   Hypertension Mother    Breast cancer Mother 59   Hypothyroidism Mother    Hyperlipidemia Father    Diabetes Father    Pancreatic cancer Father 79   Hypothyroidism Sister    Ovarian cancer Maternal Aunt        dx in her 9s   Parkinson's disease Maternal Uncle    Stroke Maternal Uncle    Breast cancer Maternal Grandmother 100   Breast cancer Maternal Aunt        dx under 50   Breast cancer Maternal Aunt        dx over 67s   Breast cancer Maternal Aunt        dx >50   Breast cancer Other    Hypothyroidism Brother    Breast cancer Cousin    Breast cancer Sister         PHYSICAL EXAM  Today's Vitals   06/18/22 0958  BP: 127/74  Pulse: 71   There is no height or weight on file to calculate BMI.    General: The patient is well-developed and well-nourished and in mild distress.   Neurologic Exam   Mental status: The patient is alert and oriented x 3 at the time of the examination. She has reduced concentration and had trouble coming up with words at times.     Cranial nerves: Extraocular  movements are full.    Facial strength and sensation is normal. Trapezius strength is norma Palatal elevation. Tongue protrusion is midline. Hearing is symmetrically    Motor:  Muscle bulk is normal. She has moderately severe left and mild right increased tone. Strength is 4/5 in the right leg and 21 to 2/5 in left leg   Sensory: She has reduced vibration in her left leg.  Touch is more symmetric   Coordination: Cerebellar testing reveals reduced left left finger-nose-finger.   She has reduced heel-to-shin on the right and isunable to do on her left   Gait and station: Station is unstable and requires bilateral support.  She transfers to scooter independently.    Reflexes: Deep tendon reflexes are symmetric and normal in the arms but she has increased reflexes in both legs. Left > right, she has nonsustained clonus at the ankles, worse on the left.   Assessment and Plan: Multiple sclerosis, primary progressive (HCC)  Spastic diplegia (HCC)  Gait disorder  Frequent falls  Urinary frequency  Other fatigue   1.   She will Continue PT, OT as outpatient 2.   She has primary progressive MS and is not on a DMT.  She had breast cancer and is not an OCrevus candidate (though benefit is very modest anyhow) 2.   Stay active and work with physical therapy.   3.   Gait is very poor.  She can only take a couple steps with a walker and needs to rely on her wheelchair more.    We went over safety 4.   We discussed considering referral to scoliosis specialist though she could have higher surgical risk 5.   F/u in 6 months, sooner if new or worsening symptoms   This  visit is part of a comprehensive longitudinal care medical relationship regarding the patients primary diagnosis of multiple sclerosis and related concerns.    Kamsiyochukwu Spickler A. Epimenio Foot, MD, PhD 06/18/2022, 12:39 PM Certified in Neurology, Clinical Neurophysiology, Sleep Medicine, Pain Medicine and Neuroimaging  [facility] [address], [address] [place], [place] [address] [phone]

## 2022-07-06 ENCOUNTER — Ambulatory Visit
Admission: RE | Admit: 2022-07-06 | Discharge: 2022-07-06 | Disposition: A | Discharge status: Home / Self Care | Payer: Medicare Other | Source: Ambulatory Visit | Attending: Family Medicine | Admitting: Family Medicine

## 2022-07-06 DIAGNOSIS — R932 Abnormal findings on diagnostic imaging of liver and biliary tract: Secondary | ICD-10-CM

## 2022-07-06 MED ORDER — GADOPICLENOL 0.5 MMOL/ML IV SOLN
5.0000 mL | Freq: Once | INTRAVENOUS | Status: AC | PRN
  Administered 2022-07-06: 5 mL via INTRAVENOUS

## 2022-07-15 ENCOUNTER — Encounter: Payer: Self-pay | Admitting: Neurology

## 2022-07-15 NOTE — Telephone Encounter (Signed)
I called [facility] [place]. She states pt called yesterday. Wants to come in to be seen for evaluation to see what she will need. She will then have to see Dr. Epimenio Foot for visit to discuss need/get rx to bring back to [facility] clinic.   They will do consult free and do not need prescription for this. Pt just needs to schedule w/ them.  Walk aid- do not do anymore. Only locations that do this are [facility].

## 2022-08-29 ENCOUNTER — Other Ambulatory Visit: Payer: Self-pay | Admitting: Neurology

## 2022-09-01 NOTE — Telephone Encounter (Signed)
Last seen on 5/30/2 Follow up scheduled on 01/08/23

## 2022-11-09 ENCOUNTER — Emergency Department (HOSPITAL_BASED_OUTPATIENT_CLINIC_OR_DEPARTMENT_OTHER): Payer: Medicare Other

## 2022-11-09 ENCOUNTER — Emergency Department (HOSPITAL_BASED_OUTPATIENT_CLINIC_OR_DEPARTMENT_OTHER)
Admission: EM | Admit: 2022-11-09 | Discharge: 2022-11-10 | Disposition: A | Discharge status: Other Acute Inpatient Hospital | Payer: Medicare Other | Attending: Emergency Medicine | Admitting: Emergency Medicine

## 2022-11-09 ENCOUNTER — Other Ambulatory Visit: Payer: Self-pay

## 2022-11-09 ENCOUNTER — Encounter (HOSPITAL_BASED_OUTPATIENT_CLINIC_OR_DEPARTMENT_OTHER): Payer: Self-pay | Admitting: Pediatrics

## 2022-11-09 DIAGNOSIS — Z20822 Contact with and (suspected) exposure to covid-19: Secondary | ICD-10-CM | POA: Diagnosis not present

## 2022-11-09 DIAGNOSIS — D72829 Elevated white blood cell count, unspecified: Secondary | ICD-10-CM | POA: Insufficient documentation

## 2022-11-09 DIAGNOSIS — S72492A Other fracture of lower end of left femur, initial encounter for closed fracture: Secondary | ICD-10-CM | POA: Insufficient documentation

## 2022-11-09 DIAGNOSIS — S72499A Other fracture of lower end of unspecified femur, initial encounter for closed fracture: Secondary | ICD-10-CM

## 2022-11-09 DIAGNOSIS — Z91040 Latex allergy status: Secondary | ICD-10-CM | POA: Diagnosis not present

## 2022-11-09 DIAGNOSIS — Z853 Personal history of malignant neoplasm of breast: Secondary | ICD-10-CM | POA: Insufficient documentation

## 2022-11-09 DIAGNOSIS — S8992XA Unspecified injury of left lower leg, initial encounter: Secondary | ICD-10-CM | POA: Diagnosis present

## 2022-11-09 DIAGNOSIS — W182XXA Fall in (into) shower or empty bathtub, initial encounter: Secondary | ICD-10-CM | POA: Insufficient documentation

## 2022-11-09 LAB — BASIC METABOLIC PANEL
Anion gap: 16 — ABNORMAL HIGH (ref 5–15)
BUN: 13 mg/dL (ref 8–23)
CO2: 21 mmol/L — ABNORMAL LOW (ref 22–32)
Calcium: 9.3 mg/dL (ref 8.9–10.3)
Chloride: 101 mmol/L (ref 98–111)
Creatinine, Ser: 0.72 mg/dL (ref 0.44–1.00)
GFR, Estimated: >60 mL/min (ref >60)
Glucose, Bld: 158 mg/dL — ABNORMAL HIGH (ref 70–99)
Potassium: 4 mmol/L (ref 3.5–5.1)
Sodium: 138 mmol/L (ref 135–145)

## 2022-11-09 LAB — CBC WITH DIFFERENTIAL/PLATELET
Abs Immature Granulocytes: 0.04 10*3/uL (ref 0.00–0.07)
Basophils Absolute: 0 10*3/uL (ref 0.0–0.1)
Basophils Relative: 0 %
Eosinophils Absolute: 0 10*3/uL (ref 0.0–0.5)
Eosinophils Relative: 0 %
HCT: 37.6 % (ref 36.0–46.0)
Hemoglobin: 12.9 g/dL (ref 12.0–15.0)
Immature Granulocytes: 0 %
Lymphocytes Relative: 13 %
Lymphs Abs: 1.5 10*3/uL (ref 0.7–4.0)
MCH: 31.6 pg (ref 26.0–34.0)
MCHC: 34.3 g/dL (ref 30.0–36.0)
MCV: 92.2 fL (ref 80.0–100.0)
Monocytes Absolute: 0.8 10*3/uL (ref 0.1–1.0)
Monocytes Relative: 7 %
Neutro Abs: 9.7 10*3/uL — ABNORMAL HIGH (ref 1.7–7.7)
Neutrophils Relative %: 80 %
Platelets: 254 10*3/uL (ref 150–400)
RBC: 4.08 MIL/uL (ref 3.87–5.11)
RDW: 12.6 % (ref 11.5–15.5)
WBC: 12 10*3/uL — ABNORMAL HIGH (ref 4.0–10.5)
nRBC: 0 % (ref 0.0–0.2)

## 2022-11-09 LAB — URINALYSIS, ROUTINE W REFLEX MICROSCOPIC
Bilirubin Urine: NEGATIVE
Glucose, UA: NEGATIVE mg/dL
Hgb urine dipstick: NEGATIVE
Ketones, ur: 40 mg/dL — AB
Nitrite: NEGATIVE
Protein, ur: NEGATIVE mg/dL
Specific Gravity, Urine: 1.025 (ref 1.005–1.030)
pH: 5.5 (ref 5.0–8.0)

## 2022-11-09 LAB — SARS CORONAVIRUS 2 BY RT PCR: SARS Coronavirus 2 by RT PCR: NEGATIVE

## 2022-11-09 LAB — PROTIME-INR
INR: 1 (ref 0.8–1.2)
Prothrombin Time: 13.7 s (ref 11.4–15.2)

## 2022-11-09 LAB — URINALYSIS, MICROSCOPIC (REFLEX): RBC / HPF: NONE SEEN RBC/hpf (ref 0–5)

## 2022-11-09 MED ORDER — SODIUM CHLORIDE 0.9 % IV BOLUS
500.0000 mL | Freq: Once | INTRAVENOUS | Status: AC
  Administered 2022-11-09: 500 mL via INTRAVENOUS

## 2022-11-09 MED ORDER — MORPHINE SULFATE (PF) 4 MG/ML IV SOLN
4.0000 mg | Freq: Once | INTRAVENOUS | Status: AC
  Administered 2022-11-09: 4 mg via INTRAVENOUS
  Filled 2022-11-09: qty 1

## 2022-11-09 MED ORDER — ONDANSETRON HCL 4 MG/2ML IJ SOLN
4.0000 mg | Freq: Four times a day (QID) | INTRAMUSCULAR | Status: DC | PRN
  Administered 2022-11-09: 4 mg via INTRAVENOUS
  Filled 2022-11-09: qty 2

## 2022-11-09 NOTE — ED Provider Notes (Signed)
[facility] Provider Note   CSN: [id number] Arrival date & time: 11/09/22  1646     History  Chief Complaint  Patient presents with   Marletta Lor    Taylor Hancock is a 62 y.o. female with PMH as listed below who presents with fall. Has primary progressive MS with L-sided weakness, nonambulatory at baseline. Was transferring out of the bathtub and had fall with acute onset severe L-sided knee/leg pain. Denies new numbness/tingling but has N/T  at baseline. Did not hit her head or lose consciousness, no neck pain, chest pain. Has chronic back pain d/t scoliosis, not new today.   Past Medical History:  Diagnosis Date   Anxiety    Breast cancer (HCC)    Complication of anesthesia    patient was awake during colonoscopy as drugs were not effective-reports that propofol is an effective analgesic for her   Concussion    Depression    Family history of breast cancer    Family history of ovarian cancer    Family history of pancreatic cancer    Fibromyalgia    History of radiation therapy 05/20/17- 06/17/17   Left Breast/ 40.05 Gy in 15 fractions, Left Breast Boost/ 10 Gy in 5 fractions.    Movement disorder    MS (multiple sclerosis) (HCC)    primary progressive MS   Multiple sclerosis (HCC)    Neuropathy    Osteoporosis    Personal history of radiation therapy    Vision abnormalities        Home Medications Prior to Admission medications   Medication Sig Start Date End Date Taking? Authorizing Provider  clonazePAM (KLONOPIN) 1 MG tablet TAKE 4 PILLS BY MOUTH AT BEDTIME 06/18/22   Sater, Pearletha Furl, MD  cyclobenzaprine (FLEXERIL) 5 MG tablet One or two po QHS 06/18/22   Sater, Richard A, MD  GEMTESA 75 MG TABS Take 1 tablet by mouth daily. 05/12/21   [provider]  ketoconazole (NIZORAL) 2 % cream Apply 1 application topically every other day. 07/12/20   [provider]  pantoprazole (PROTONIX) 40 MG tablet Take 40 mg by  mouth daily.    [provider]  tiZANidine (ZANAFLEX) 4 MG tablet TAKE 1 TABLET IN THE MORNING, 1 TABLET IN THE AFTERNOON AND 2 TABLETS AT BEDTIME. 05/28/22   Sater, Pearletha Furl, MD  triamcinolone cream (KENALOG) 0.1 % Apply 1 application topically 2 (two) times daily.    [provider]      Allergies    Fluogen [influenza virus vaccine], Letrozole, Levaquin [levofloxacin in d5w], Ppa-mma express [compleat], Zithromax [azithromycin], Latex, and Ultram [tramadol hcl]    Review of Systems   Review of Systems A 10 point review of systems was performed and is negative unless otherwise reported in HPI.  Physical Exam Updated Vital Signs BP (!) 179/93   Pulse 95   Temp 99.1 F (37.3 C)   Resp 20   Ht 5\' 5"  (1.651 m)   Wt 61.2 kg   SpO2 100%   BMI 22.47 kg/m  Physical Exam General: Normal appearing female, lying in bed.  HEENT: Sclera anicteric, MMM, trachea midline. NCAT, no midline C-spine TTP Cardiology: RRR, no murmurs/rubs/gallops. No chest wall TTP. Resp: Normal respiratory rate and effort. CTAB, no wheezes, rhonchi, crackles.  Abd: Soft, non-tender, non-distended. No rebound tenderness or guarding.  Pelvis: pelvis stable, nontender MSK: Significant swelling and TTP to L distal femur/knee. Large effusion. Compartments in LLE soft. Distal  DP/PT pulses in L foot palpable. No other signs of trauma.  Skin: warm, dry. No rashes or lesions. Back: No midline T or L spine TTP Neuro: A&Ox4, CNs II-XII grossly intact. MAEs.  Psych: Normal mood and affect.   ED Results / Procedures / Treatments   Labs (all labs ordered are listed, but only abnormal results are displayed) Labs Reviewed  CBC WITH DIFFERENTIAL/PLATELET - Abnormal; Notable for the following components:      Result Value   WBC 12.0 (*)    Neutro Abs 9.7 (*)    All other components within normal limits  BASIC METABOLIC PANEL - Abnormal; Notable for the following components:   CO2 21 (*)    Glucose, Bld  158 (*)    Anion gap 16 (*)    All other components within normal limits  URINALYSIS, ROUTINE W REFLEX MICROSCOPIC - Abnormal; Notable for the following components:   Ketones, ur 40 (*)    Leukocytes,Ua TRACE (*)    All other components within normal limits  URINALYSIS, MICROSCOPIC (REFLEX) - Abnormal; Notable for the following components:   Bacteria, UA FEW (*)    All other components within normal limits  SARS CORONAVIRUS 2 BY RT PCR  PROTIME-INR    EKG None  Radiology DG Knee Complete 4 Views Left  Result Date: 11/09/2022 CLINICAL DATA:  Status post fall with left knee weakness. EXAM: LEFT KNEE - COMPLETE 4+ VIEW COMPARISON:  10/19/2018 FINDINGS: The bones are subjectively under mineralized. There is an oblique fracture through the distal femoral metaphysis. Mild apex lateral angulation. There is a small joint effusion but no lipohemarthrosis. Tibiofemoral alignment is grossly maintained. Generalized soft tissue edema. IMPRESSION: Oblique fracture through the distal femoral metaphysis with mild apex lateral angulation. Electronically Signed   By: Narda Rutherford M.D.   On: 11/09/2022 19:43   DG Hip Unilat With Pelvis 2-3 Views Left  Result Date: 11/09/2022 CLINICAL DATA:  Fall with left leg weakness. EXAM: DG HIP (WITH OR WITHOUT PELVIS) 2-3V LEFT COMPARISON:  10/19/2018 FINDINGS: No evidence of fracture of the pelvis or left hip. No hip dislocation. The pubic rami are intact. Pubic symphysis and sacroiliac joints are congruent. Multiple pelvic phleboliths. The bones are subjectively under mineralized. IMPRESSION: No fracture of the pelvis or left hip. Electronically Signed   By: Narda Rutherford M.D.   On: 11/09/2022 19:41    Procedures .Critical Care  Performed by: Loetta Rough, MD Authorized by: Loetta Rough, MD   Critical care provider statement:    Critical care time (minutes):  30   Critical care was time spent personally by me on the following activities:   Development of treatment plan with patient or surrogate, discussions with consultants, evaluation of patient's response to treatment, examination of patient, ordering and review of laboratory studies, ordering and review of radiographic studies, ordering and performing treatments and interventions, pulse oximetry, re-evaluation of patient's condition, review of old charts and obtaining history from patient or surrogate   Care discussed with: accepting provider at another facility       Medications Ordered in ED Medications  ondansetron (ZOFRAN) injection 4 mg (4 mg Intravenous Given 11/09/22 1837)  morphine (PF) 4 MG/ML injection 4 mg (4 mg Intravenous Given 11/09/22 1838)  sodium chloride 0.9 % bolus 500 mL (0 mLs Intravenous Stopped 11/09/22 2009)    ED Course/ Medical Decision Making/ A&P  Medical Decision Making Amount and/or Complexity of Data Reviewed Labs: ordered. Radiology: ordered.  Risk Prescription drug management. Decision regarding hospitalization.    This patient presents to the ED for concern of fall w/ L leg pain, this involves an extensive number of treatment options, and is a complaint that carries with it a high risk of complications and morbidity.  I considered the following differential and admission for this acute, potentially life threatening condition.   MDM:    DDX for trauma includes but is not limited to:  -Head Injury such as skull fx or ICH - patient NCAT, did not hit her head or lose consciousness, no indicate for acute head imaging -Spinal Cord or Vertebral injury - no back pain, no new FNDs -Fractures - Patient with obvious swelling over L knee with minimal ROM d/t severe pain. XR of L femur demonstrates distal femoral shaft fracture, comminuted displaced.  -Soft compartments, neurovascularly at her baseline, no c/f compartment syndrome   Clinical Course as of 11/09/22 2357  Mon Nov 09, 2022  1916 WBC 12.0 likely d/t  trauma, denies infectious symptoms. Does have T 99.8F and tachycardia on arrival, likely d/t pain, but will obtain urine as well to r/o UTI.  [HN]  1919 Covid neg, PT/INR unremarkable, BMP Unremarkable in the context of this patient's presentation  [HN]  1932 D/w Dr. Odis Hollingshead with orthopedics. States that patient has been seeing [facility] including ortho for spastic diplegia and contractures. Recommends transfer to [facility] as this would be a complex repair given her contractures.  [HN]  2008 D/w patient, who states she would prefer to go to [facility], will attempt to transfer her there. Secretary paging ortho at [facility].  [HN]  2324 Still attempting to reach [facility] orthopedic surgery.  [HN]  2327 Called and spoke to [place] with transfer center  [HN]  2356 No availability for ortho/OR at [facility]. Discussed with Dr. Durel Salts with orthopedics at [facility] in [place], who stated that he would accept the patient for ER to ER transfer and evaluation. D/w patient who is in agreement. Patient will be transferred ER to ER via [facility].  [HN]    Clinical Course User Index [HN] Loetta Rough, MD    Labs: I Ordered, and personally interpreted labs.  The pertinent results include:  those listed above  Imaging Studies ordered:  I independently visualized and interpreted imaging. I agree with the radiologist interpretation  Additional history obtained from chart review.    Reevaluation: After the interventions noted above, I reevaluated the patient and found that they have :improved  Social Determinants of Health: Lives independently  Disposition:  Transfer ED to ED to [facility] in [place] for eval by orthopedics, accepted by Dr. Durel Salts  Co morbidities that complicate the patient evaluation  Past Medical History:  Diagnosis Date   Anxiety    Breast cancer (HCC)    Complication of anesthesia     patient was awake during colonoscopy as drugs were not effective-reports that propofol is an effective analgesic for her   Concussion    Depression    Family history of breast cancer    Family history of ovarian cancer    Family history of pancreatic cancer    Fibromyalgia    History of radiation therapy 05/20/17- 06/17/17   Left Breast/ 40.05 Gy in 15 fractions, Left Breast Boost/ 10 Gy in 5 fractions.    Movement disorder  MS (multiple sclerosis) (HCC)    primary progressive MS   Multiple sclerosis (HCC)    Neuropathy    Osteoporosis    Personal history of radiation therapy    Vision abnormalities      Medicines Meds ordered this encounter  Medications   morphine (PF) 4 MG/ML injection 4 mg   ondansetron (ZOFRAN) injection 4 mg   sodium chloride 0.9 % bolus 500 mL    I have reviewed the patients home medicines and have made adjustments as needed  Problem List / ED Course: Problem List Items Addressed This Visit   None Visit Diagnoses     Closed comminuted intra-articular fracture of distal femur, unspecified laterality, initial encounter [facility])    -  Primary                   This note was created using dictation software, which may contain spelling or grammatical errors.    Loetta Rough, MD 11/09/22 [phone]

## 2022-11-09 NOTE — ED Triage Notes (Signed)
Reports hx of progressive MS; and has left leg weakness, stated she was coming out of the tub when fell and landed on left side, -head injury; -LOC. C/O left knee and buttocks area pain.

## 2022-11-09 NOTE — ED Notes (Signed)
Fall risk armband Fall risk sign on door Patient wearing shoes

## 2022-11-09 NOTE — ED Notes (Addendum)
[facility] called for transport to [facility] for ED to ED transfer per MD Jearld Fenton.

## 2022-11-10 ENCOUNTER — Telehealth: Payer: Self-pay | Admitting: Neurology

## 2022-11-10 DIAGNOSIS — R269 Unspecified abnormalities of gait and mobility: Secondary | ICD-10-CM

## 2022-11-10 DIAGNOSIS — G35B0 Primary progressive multiple sclerosis, unspecified: Secondary | ICD-10-CM

## 2022-11-10 DIAGNOSIS — F09 Unspecified mental disorder due to known physiological condition: Secondary | ICD-10-CM

## 2022-11-10 DIAGNOSIS — G35 Multiple sclerosis: Secondary | ICD-10-CM

## 2022-11-10 DIAGNOSIS — R5383 Other fatigue: Secondary | ICD-10-CM

## 2022-11-10 DIAGNOSIS — D72829 Elevated white blood cell count, unspecified: Secondary | ICD-10-CM | POA: Diagnosis not present

## 2022-11-10 DIAGNOSIS — S72435A Nondisplaced fracture of medial condyle of left femur, initial encounter for closed fracture: Secondary | ICD-10-CM | POA: Insufficient documentation

## 2022-11-10 DIAGNOSIS — S8992XA Unspecified injury of left lower leg, initial encounter: Secondary | ICD-10-CM | POA: Diagnosis present

## 2022-11-10 DIAGNOSIS — R261 Paralytic gait: Secondary | ICD-10-CM

## 2022-11-10 DIAGNOSIS — R296 Repeated falls: Secondary | ICD-10-CM

## 2022-11-10 DIAGNOSIS — R350 Frequency of micturition: Secondary | ICD-10-CM

## 2022-11-10 DIAGNOSIS — Z91040 Latex allergy status: Secondary | ICD-10-CM | POA: Diagnosis not present

## 2022-11-10 DIAGNOSIS — W182XXA Fall in (into) shower or empty bathtub, initial encounter: Secondary | ICD-10-CM | POA: Diagnosis not present

## 2022-11-10 DIAGNOSIS — G801 Spastic diplegic cerebral palsy: Secondary | ICD-10-CM

## 2022-11-10 DIAGNOSIS — Z20822 Contact with and (suspected) exposure to covid-19: Secondary | ICD-10-CM | POA: Diagnosis not present

## 2022-11-10 DIAGNOSIS — S72492A Other fracture of lower end of left femur, initial encounter for closed fracture: Secondary | ICD-10-CM | POA: Diagnosis not present

## 2022-11-10 DIAGNOSIS — Z853 Personal history of malignant neoplasm of breast: Secondary | ICD-10-CM | POA: Diagnosis not present

## 2022-11-10 MED ORDER — MORPHINE SULFATE (PF) 4 MG/ML IV SOLN
4.0000 mg | Freq: Once | INTRAVENOUS | Status: AC
  Administered 2022-11-10: 4 mg via INTRAVENOUS
  Filled 2022-11-10: qty 1

## 2022-11-10 NOTE — Telephone Encounter (Signed)
Pt's husband called stating that the pt had a fall and broke her femur and when taken to the [facility] he states that no one wanted to work on her due to her MS so the pt was transferred to [facility] and no one there wanted to risk working on her as well so the pt was taken to [facility] in [place] where she is now. Tawanna Cooler would like for provider to call him to advise what he should do because they are talking about putting her in a soft cast and sending her home like that.

## 2022-11-11 NOTE — Telephone Encounter (Signed)
[facility] said they will take this patient.

## 2022-11-11 NOTE — Telephone Encounter (Signed)
Pt is asking for a home health aid as a result of being recently discharged from the hospital, please call pt to discuss.

## 2022-11-11 NOTE — Telephone Encounter (Addendum)
Spoke with Dr. Epimenio Foot. He agreed to place referral to home health for aid.  I called pt. She got home 11am yesterday. Aware we will place referral for home health. May need updated visit in order for them to accept referral. We will call her back if they come back stating this is needed.

## 2022-12-16 ENCOUNTER — Telehealth: Payer: Self-pay | Admitting: Neurology

## 2022-12-16 MED ORDER — TIZANIDINE HCL 4 MG PO TABS: 4.0000 mg | ORAL_TABLET | Freq: Three times a day (TID) | ORAL | 1 refills | Status: DC

## 2022-12-16 NOTE — Telephone Encounter (Signed)
Given oxycodone 10-325mg  1 po TID by PCP for increased pain. He increased dose from 5-325mg . She read there is a possible interaction with tizanidine and stopped. She is not taking flexeril or clonazepam either. Relayed I will speak with MD and call her back.   I spoke with Dr. Epimenio Foot. He said its ok to go back to taking tizanidine 4mg  po TID with medications she is on. She verbalized understanding. New rx sent to pharmacy.

## 2022-12-16 NOTE — Telephone Encounter (Signed)
[place] from [facility] called stating that the pt has been having muscle spasms and [place] would like to know if the pt can be on antiinflammatories. Please advise.

## 2022-12-18 DIAGNOSIS — M797 Fibromyalgia: Secondary | ICD-10-CM | POA: Insufficient documentation

## 2022-12-18 DIAGNOSIS — F419 Anxiety disorder, unspecified: Secondary | ICD-10-CM | POA: Insufficient documentation

## 2022-12-18 DIAGNOSIS — Z923 Personal history of irradiation: Secondary | ICD-10-CM | POA: Insufficient documentation

## 2022-12-18 DIAGNOSIS — M810 Age-related osteoporosis without current pathological fracture: Secondary | ICD-10-CM | POA: Insufficient documentation

## 2022-12-23 NOTE — Telephone Encounter (Signed)
Per Dr. Epimenio Foot: "please let her know it is safe but might make her more sleepy"

## 2022-12-23 NOTE — Telephone Encounter (Signed)
Called and spoke w/ pt. Relayed Dr. Bonnita Hollow message. She verbalized understanding. She does not feel she can make it to appt on 12/29/22 at 11:30am. She agreed to change to mychart virutal visit. I converted and explained how she will log on. She will call that day if she has any difficulties.

## 2022-12-23 NOTE — Telephone Encounter (Signed)
Pt asking due to PCP increase frequency of Oxycodone every 6 hours, taking Tizandine every 8 hours. Can I take both medication together. Would like a call from the nurse.

## 2022-12-29 ENCOUNTER — Telehealth: Payer: Self-pay | Admitting: Neurology

## 2022-12-29 ENCOUNTER — Encounter: Payer: Self-pay | Admitting: Neurology

## 2022-12-29 ENCOUNTER — Telehealth (INDEPENDENT_AMBULATORY_CARE_PROVIDER_SITE_OTHER): Payer: Medicare Other | Admitting: Neurology

## 2022-12-29 DIAGNOSIS — R269 Unspecified abnormalities of gait and mobility: Secondary | ICD-10-CM

## 2022-12-29 DIAGNOSIS — R296 Repeated falls: Secondary | ICD-10-CM | POA: Diagnosis not present

## 2022-12-29 DIAGNOSIS — G35 Multiple sclerosis: Secondary | ICD-10-CM | POA: Diagnosis not present

## 2022-12-29 DIAGNOSIS — G801 Spastic diplegic cerebral palsy: Secondary | ICD-10-CM

## 2022-12-29 DIAGNOSIS — R5383 Other fatigue: Secondary | ICD-10-CM | POA: Diagnosis not present

## 2022-12-29 DIAGNOSIS — R350 Frequency of micturition: Secondary | ICD-10-CM

## 2022-12-29 MED ORDER — TIZANIDINE HCL 4 MG PO TABS: 4.0000 mg | ORAL_TABLET | Freq: Four times a day (QID) | ORAL | 5 refills | Status: DC

## 2022-12-29 NOTE — Progress Notes (Addendum)
[facility]  PATIENT: Taylor Hancock DOB: 1960/10/21  REFERRING CLINICIAN: Dr. Darleene Cleaver   HISTORY FROM: Patient  REASON FOR VISIT: PPMS    HISTORICAL  CHIEF COMPLAINT:  No chief complaint on file.    HISTORY OF PRESENT ILLNESS:  [name] is a 62 year old woman with primary progressive multiple sclerosis diagnosed in 2005.     Update 03/20/2021: Virtual Visit via telephone note I connected with [name] on 03/20/2021 by telephone and verified that I am speaking with the correct person.  I discussed the limitations of evaluation and management by telemedicine and the availability of in person appointments. The patient expressed understanding and agreed to proceed.  She has been unable to connect on video.  The patient connected while at home and the provider was at the office.  History of Present Illness: MS:   She has PPMS and is not on any DMT.  Due to her impairments, she is homebound   She was doing [facility] 2 hours (OT once a week and PT twice a week)   She feels it was more beneficial than Home PT/OT.   She had felt better doing exercise  She fell in October 20 and went to the ED the next day .  She fell while transferring to the scooter and slipped.  She broke her femur and surgery was going to be done.  She was transferred to [facility] and a brace was placed but surgery was not recommended.  She went home from the hospital.     She continues to have leg pain.   She was recently switched from oxycodone to hydrocodone (Dr. Darleene Cleaver).   She still has spasticity in the leg and takes tizanidine 1 po tid to qid.  She feels this helps.    She can't bear weight right now.   We discussed her calling orthopedics   Usually, she can transfer well (arm strength is good).    She uses a power wheelchair now for indoors and scooter outdoors.   She has less trunk strength   Both legs weak, left > right.   She also has spasticity  and it is better with  higher dose of tizanidine.    She was unable to tolerate baclofen in the past.        She now has a foley catheter now.   In the past, Tora Perches helped a little only.    She also has a Purewick and was using it in the past but it is expensive   Other:  She has breast cancer followed by Dr. Pamelia Hoit.  She had  hypertension in the past but now is off med's and BP was actually milly low today.   She was recently diagnosed with DM (glucose was 167 and HgbA1c was 6.9).   She lost  weight and sugars are reportedly much better   MS History:  She had a [place] sign in the 1990's a few times and she felt her neck was weaker at that time.  However, gait was not affected at that time.  She was diagnosed with multiple sclerosis in 2005 after presenting with several years of worsening gait. Initially, she was diagnosed with relapsing remitting MS and was placed on Betaseron. She had difficulty tolerating Betaseron but did continue to take it. Her first MRI after the diagnosis was unchanged from her previous one. A couple years later she transferred her care to me. After review of her time  course of disease, it was apparent that she had a progressive MS and not a relapsing form of MS. Therefore, the Betaseron was discontinued. Due to extent of disability, Ocrelizumab was not used.  She remains off a DMT.    IMAGING MRI brain 12/13/2018 showed Moderate to severe atrophy, with progression since 2017.  White matter changes in the brain are stable since 2017. Noacute intracranial abnormality  MRI cervical spine 12/13/2018 sho wed stable spinal cord foci at C2-3, C4, and C5 similar to the prior study. No enhancing lesions.   REVIEW OF SYSTEMS:  Constitutional: No fevers, chills, sweats, or change in appetite.  Notes fatigue.   Has insomnia and sleepiness Eyes: No visual changes, double vision.  Notes light sensitivity and eye pain Ear, nose and throat: No hearing loss, ear pain, nasal congestion, sore throat.     Cardiovascular: No chest pain, palpitations Respiratory:  No shortness of breath at rest or with exertion.   No wheezes GastrointestinaI: No nausea, vomiting, diarrhea, abdominal pain.  Occ fecal incontinence Genitourinary:  see above.    Musculoskeletal:  She has myalgias.  Broke femur in October 2024 Integumentary: No rash, pruritus, skin lesions.   Edema in feet Neurological: as above Psychiatric: Notes depression and anxiety  Endocrine: No palpitations, diaphoresis, change in appetite, change in weigh or increased thirst Hematologic/Lymphatic:  No anemia, purpura, petechiae. Allergic/Immunologic: No itchy/runny eyes, nasal congestion, recent allergic reactions, rashes  ALLERGIES: Allergies  Allergen Reactions   Fluogen [Influenza Virus Vaccine]    Letrozole Other (See Comments)    Suicidal ideation   Levaquin [Levofloxacin In D5w]    Ppa-Mma Express [Compleat] Nausea And Vomiting    Blood in vomit and back felt like it was breaking.    Zithromax [Azithromycin] Other (See Comments)    abd cramping   Latex Itching   Ultram [Tramadol Hcl] Anxiety    HOME MEDICATIONS: Outpatient Medications Prior to Visit  Medication Sig Dispense Refill   cloNIDine (CATAPRES) 0.1 MG tablet Take 0.1 mg by mouth 2 (two) times daily as needed.     oxyCODONE-acetaminophen (PERCOCET) 10-325 MG tablet Take 1 tablet by mouth 3 (three) times daily as needed.     tiZANidine (ZANAFLEX) 4 MG tablet Take 1 tablet (4 mg total) by mouth 3 (three) times daily. 90 tablet 1   No facility-administered medications prior to visit.    PAST MEDICAL HISTORY: Past Medical History:  Diagnosis Date   Anxiety    Breast cancer (HCC)    Complication of anesthesia    patient was awake during colonoscopy as drugs were not effective-reports that propofol is an effective analgesic for her   Concussion    Depression    Family history of breast cancer    Family history of ovarian cancer    Family history of pancreatic  cancer    Fibromyalgia    History of radiation therapy 05/20/17- 06/17/17   Left Breast/ 40.05 Gy in 15 fractions, Left Breast Boost/ 10 Gy in 5 fractions.    Movement disorder    MS (multiple sclerosis) (HCC)    primary progressive MS   Multiple sclerosis (HCC)    Neuropathy    Osteoporosis    Personal history of radiation therapy    Vision abnormalities     PAST SURGICAL HISTORY: Past Surgical History:  Procedure Laterality Date   BREAST LUMPECTOMY     BREAST LUMPECTOMY WITH RADIOACTIVE SEED AND SENTINEL LYMPH NODE BIOPSY Left 03/31/2017   Procedure: BREAST LUMPECTOMY WITH RADIOACTIVE  SEED AND SENTINEL LYMPH NODE BIOPSY;  Surgeon: Almond Lint, MD;  Location: [facility] OR;  Service: General;  Laterality: Left;   FRACTURE SURGERY Left    hand   I & D EXTREMITY  05/30/2011   Procedure: IRRIGATION AND DEBRIDEMENT EXTREMITY;  Surgeon: Tami Ribas, MD;  Location: [facility] ORS;  Service: Orthopedics;;  Incision and drainage of open Proximal interphalangeal joint with closed reduction of Proximal interphalangeal  joint left long finger   KNEE ARTHROSCOPY      FAMILY HISTORY: Family History  Problem Relation Age of Onset   Hypertension Mother    Breast cancer Mother 93   Hypothyroidism Mother    Hyperlipidemia Father    Diabetes Father    Pancreatic cancer Father 34   Hypothyroidism Sister    Ovarian cancer Maternal Aunt        dx in her 14s   Parkinson's disease Maternal Uncle    Stroke Maternal Uncle    Breast cancer Maternal Grandmother 100   Breast cancer Maternal Aunt        dx under 50   Breast cancer Maternal Aunt        dx over 36s   Breast cancer Maternal Aunt        dx >50   Breast cancer Other    Hypothyroidism Brother    Breast cancer Cousin    Breast cancer Sister         Observations/Objective:  She is alert and oriented with normal speech.    Assessment and Plan: Multiple sclerosis, primary progressive (HCC)  Frequent falls  Other fatigue  Spastic  diplegia (HCC)  Gait disorder  Urinary frequency   1.   She has primary progressive MS.  She will remain off of a disease modifying therapy.   2.   She needs to check back with orthopedics to see if she is able to advance activity after the fracture.  Try to stay active and work with physical therapy when cleared.   3.   Continue tizanidine 4 spasticity. 4.    F/u in 6 months, sooner if new or worsening symptoms  Follow Up Instructions: I discussed the assessment and treatment plan with the patient. The patient was provided an opportunity to ask questions and all were answered. The patient agreed with the plan and demonstrated an understanding of the instructions.    The patient was advised to call back or seek an in-person evaluation if the symptoms worsen or if the condition fails to improve as anticipated.  27 minute office visit with the majority of the time communicating with patient by phone.     This visit is part of a comprehensive longitudinal care medical relationship regarding the patients primary diagnosis of MS and related concerns.   Kira Hartl A. Epimenio Foot, MD, PhD 12/29/2022, 12:35 PM Certified in Neurology, Clinical Neurophysiology, Sleep Medicine, Pain Medicine and Neuroimaging  [facility] [address] [place], [place] [address] [phone]

## 2022-12-29 NOTE — Telephone Encounter (Signed)
LVM and sent MyChart msg asking pt to call back and schedule 6 month appt- June 2025.

## 2022-12-29 NOTE — Telephone Encounter (Signed)
Pt called to schedule 6 month fu- Morrie Sheldon

## 2022-12-29 NOTE — Addendum Note (Signed)
Addended by: Despina Arias A on: 12/29/2022 04:55 PM   Modules accepted: Level of Service

## 2023-01-14 ENCOUNTER — Telehealth: Payer: Self-pay | Admitting: Neurology

## 2023-01-14 NOTE — Telephone Encounter (Signed)
Pt is wanting to know when this will be called in for her. Please advise.

## 2023-01-14 NOTE — Telephone Encounter (Signed)
Please advise patient and/or caretaker on DPR that I do not recommend taking 4 pills of tizanidine at bedtime together, but take the 4 mg strength tizanidine as prescribed, 1 pill 4 times daily. Taking a high dose at night can be dangerous. I will copy Dr. Epimenio Foot for further input as necessary.

## 2023-01-14 NOTE — Telephone Encounter (Signed)
Pt's husband, Thonda Cottrill, (on [place]) she's taking Clonazepam 1 mg 4 tablets at bedtime, tiZANidine (ZANAFLEX) 4 MG tablet, Cyclobenzaprine 5 mg 2 tablet at bedtime, tiZANidine (ZANAFLEX) 4 MG tablet. She takes all these medication and still has a lot of pain. She is unable to sleep because of the pain. Would like a call back to discuss what to do.  Advised Mr. Glatfelter to take patient to the ED, Mr. Killoren stated, no one will touch her because they are afraid of  lawsuit.  The caregiver was also on the phone, but informed her I could not discuss health information with her due to is not on the [facility].

## 2023-01-14 NOTE — Telephone Encounter (Signed)
Called the spouse back and the patient and caretaker Taylor Hancock were on the phone. Prior to her injury (fracture) the patient for several years had been on flexiril 5 mg 1-2 tab at bedtime, along with clonazepam 4 mg at bedtime and along with Tizanidine 4 mg (prescribed QID but pt is taken 4 tab at bedtime)  Advised the patient that the only thing I currently see prescribed by Dr Epimenio Foot is the tizanidine. Pt states that she has not slept in 3 months. Prior to the accident she was taking the above medication. She states that when the fracture happened and she was in hospital they started her on oxycodone and then hydrocodone. She stopped those medications due to not liking the way she felt.  I questioned if the patient tried contacting orthopedic MD to see about alternative meds to help. She stated that they wouldn't prescribe her anything else. She states that she has just been taking the previous meds she had at home and would like to restart these. I informed the patient that the work in MD is not going to feel comfortable prescribing those medications. I asked about how much meds she had and she is about to be out of the tizanidine. Advised that there was a script that was sent in on 12/29/2022 to the pharmacy. Confirmed the pharmacy on file was correct. Taylor Hancock stated they received a text indicating that the tizanidine didn't have refills and MD needed to reach out. Advised I would call the pharmacy for them. (I did call the pharmacy and they confirmed they had the new script and they are working on it)  In the meantime I also questioned had they been involved with pain specialist she states that primary care attempted to refer for pain management but wherever it was sent they only did injections and mild pain med. They are asking if we could send a referral. Advised I would bring this to Dr Bonnita Hollow attention for him to look at upon his return.

## 2023-01-18 ENCOUNTER — Other Ambulatory Visit: Payer: Self-pay | Admitting: Neurology

## 2023-01-18 MED ORDER — CLONAZEPAM 1 MG PO TABS: 4.0000 mg | ORAL_TABLET | Freq: Every evening | ORAL | 0 refills | Status: DC

## 2023-01-18 NOTE — Telephone Encounter (Signed)
Pt requesting refill of clonazePAM (KLONOPIN) 1 MG tablet  [id number]    Also states she needs other refills of medications not listed and that she has barely been sleeping. Requesting call back

## 2023-01-18 NOTE — Telephone Encounter (Signed)
Last VV 12-29-2022, last fill 09-28-2022 #360, next appt 07-13-2023.  She has refills available 30 day supply for tizanidine.

## 2023-01-18 NOTE — Telephone Encounter (Signed)
I called pt and let her know that tizanidine (it is not safe for her to take 4 tabs all at once.  Recommended Let the patient know it would be better to take 1 in the morning, 1 later in the day and 2 at night.  It is not as safe to take all 4 at once.  I relayed this VM also got another message about her clonazepam (not addressed as yet).

## 2023-01-19 ENCOUNTER — Other Ambulatory Visit: Payer: Self-pay | Admitting: Neurology

## 2023-01-19 MED ORDER — CYCLOBENZAPRINE HCL 5 MG PO TABS: ORAL_TABLET | ORAL | 5 refills | Status: DC

## 2023-01-19 MED ORDER — CYCLOBENZAPRINE HCL 5 MG PO TABS: 5.0000 mg | ORAL_TABLET | Freq: Three times a day (TID) | ORAL | 1 refills | Status: DC | PRN

## 2023-01-19 NOTE — Telephone Encounter (Addendum)
 Pt returned call with her caregiver on the line.  Pt is taking klonopin , tizanidine , clonidine.  She has been on cyclobenzaprine  as well previously per 05-2022 note.  She has not slept in 3 months.  She says the combo she was on previously did help her sleep.  I told her that tizanidine  and cyclobenzaprine  are both for muscle spasms.  She takes melatonin 20mg  at bedtime. She is not taking oxycodone  or hydrocodone .  She did get message about taking the  tizanidine  split during the day.  She is asking for the cyclobenzaprine  to be added (like previously) and if there is anything for for sleep that she can take.

## 2023-01-19 NOTE — Telephone Encounter (Signed)
Taylor Hancock pt's home health nurse is asking if pt can take more muscle relaxer's instead of the prescribed medication. Requesting call back [phone]

## 2023-01-19 NOTE — Telephone Encounter (Signed)
Called Tammy back and relayed message that [place] gave previously per Dr. Epimenio Foot.

## 2023-01-19 NOTE — Telephone Encounter (Signed)
 I called pt and relayed that per Dr. Vear he did prescribe the tizanidine  5mg  po 1-2tabs po at bedtime. Hopefully this will help with her sleep. If not she will let us  know.  She was also reminded to take the tizanidine  as previously prescribed 1 in am, 1 later in  and 2 bedtime for her safety.  Pt did relay understanding.  She appreciated call back.

## 2023-01-21 ENCOUNTER — Encounter: Payer: Self-pay | Admitting: Neurology

## 2023-01-22 NOTE — Telephone Encounter (Signed)
 Pt called wanting to know the update on this order. Please advise.

## 2023-01-25 ENCOUNTER — Telehealth: Payer: Self-pay

## 2023-01-25 ENCOUNTER — Other Ambulatory Visit: Payer: Self-pay | Admitting: Neurology

## 2023-01-25 DIAGNOSIS — G801 Spastic diplegic cerebral palsy: Secondary | ICD-10-CM

## 2023-01-25 DIAGNOSIS — G35 Multiple sclerosis: Secondary | ICD-10-CM

## 2023-01-25 DIAGNOSIS — R269 Unspecified abnormalities of gait and mobility: Secondary | ICD-10-CM

## 2023-01-25 NOTE — Telephone Encounter (Signed)
 Called pt and asked what company she wanted her order for her [employer], going to call company for fax number to send order.

## 2023-01-28 ENCOUNTER — Telehealth: Payer: Self-pay | Admitting: Neurology

## 2023-01-28 DIAGNOSIS — Z8781 Personal history of (healed) traumatic fracture: Secondary | ICD-10-CM

## 2023-01-28 NOTE — Telephone Encounter (Signed)
 Tammy from [facility] states pt wants to know how often are xrays suggessted re: pt's left femur fracture

## 2023-01-28 NOTE — Telephone Encounter (Signed)
 I called Tammy back and asked her to follow-up with orthopedics for this. She said she already called the orthopedic surgeon and they are deferring this back to Dr Vear. Tammy is asking if Dr Vear is able to help or at least if he is able to refer patient to another orthopedist not with [facility] (pt doesn't want to that group).

## 2023-02-01 NOTE — Telephone Encounter (Signed)
 Due to this not being Dr Duncan speciality, he has recommended that we set her up with one of the [facility] orthopedic groups for a follow-up on the fracture.  They will likely x-ray her when they follow-up and be better able to manage this.   I called Tammy back and let her know. I then called the patient. She is asking for Dr Hiram who is at [facility]. He has seen the patient in the past. Will refer there.

## 2023-02-01 NOTE — Telephone Encounter (Signed)
 Called [employer]. Spoke w/ Gunnar Fusi. They are out of business. Spoke w/ Dr. Epimenio Foot. Ok to send order [facility] if they will accept. I sent message to [facility] to see if they can take this order. Waiting on response.

## 2023-02-02 ENCOUNTER — Telehealth: Payer: Self-pay | Admitting: Neurology

## 2023-02-02 NOTE — Telephone Encounter (Signed)
Referral for orthopedics fax to [facility]. Phone: [phone], Fax: [phone]

## 2023-02-08 NOTE — Telephone Encounter (Signed)
Per Taylor Hancock:    Taylor Hancock. Will follow up with pt to see if she has alternate place she would like to use for rx.

## 2023-02-12 ENCOUNTER — Encounter: Payer: Self-pay | Admitting: Neurology

## 2023-02-16 ENCOUNTER — Telehealth: Payer: Self-pay | Admitting: Neurology

## 2023-02-16 NOTE — Telephone Encounter (Signed)
Pt is f/u on her request she submitted on my chart for a new scooter since her's has a poor charge life.

## 2023-02-16 NOTE — Telephone Encounter (Signed)
See phone note from 02/16/23

## 2023-02-16 NOTE — Telephone Encounter (Signed)
Called pt and let her know that Kara Mead and I both tried to get her scooter through [employer] (pt's choice) who is out of business (the office that takes Faxed Orders). Kara Mead tried [facility] and they don't do scooters. Asked pt if she has any other places she would like to get a scooter and she said [facility] [employer]. Will call them to see if they take faxed orders.

## 2023-02-18 NOTE — Telephone Encounter (Signed)
Taylor Hancock tried calling location pt provided this am but they do not open until 9am

## 2023-02-18 NOTE — Telephone Encounter (Signed)
Called pt back and let her know that [employer] [facility] stated that they do not run a persons insurance when trying to purchase a scooter and that they do a payment plan for their scooters. Asked pt if she wanted to go that route or look for another company that sales scooters. Pt would like to try [employer]. Will call them to see their policy.

## 2023-02-18 NOTE — Telephone Encounter (Signed)
Called [facility] [phone] and they stated that they do not take insurance for payment for scooters, they do a payment plan. Going to call pt to see if she still wants to go this route.

## 2023-02-18 NOTE — Telephone Encounter (Signed)
LVM for pt to call back about the Company she wants to get her scooter from.

## 2023-02-18 NOTE — Telephone Encounter (Signed)
Pt has called back, phone rep asked for the company she wants to get her scooter from.  Pt said she would prefer to continue the conversation with CMA because she does not recall how far along in conversation they got about discussing her options.

## 2023-02-23 NOTE — Telephone Encounter (Signed)
 Called [employer] to inquire about their policy for their scooters, was informed that they do not take insurance as well, and that they do have a payment plan. Call rep stated that pt can call her PCP if she has a printed script for her scooter and her PCP can give her a DME to get her scooter through.

## 2023-02-23 NOTE — Telephone Encounter (Addendum)
Called pt back and she stated that she was going to figure out what she was going to do, whether or not she was going to buy the scooter herself and get reimbursed from [employer] or find another company.

## 2023-02-23 NOTE — Telephone Encounter (Signed)
Pt has called CMA back re: assistance in getting new scooter, pt would like a call back.

## 2023-02-24 NOTE — Telephone Encounter (Signed)
Called pt and she stated that she called Medicare and they gave her 3 places to get her scooter. Going to fax script.

## 2023-02-24 NOTE — Telephone Encounter (Signed)
 Pt has returned call to [facility], CMA, she is asking for a call back

## 2023-02-25 NOTE — Telephone Encounter (Signed)
 Faxed pt's Script for scooter to [place] at [facility] [phone] on 02/25/2023

## 2023-03-18 ENCOUNTER — Encounter: Payer: Self-pay | Admitting: Neurology

## 2023-03-18 NOTE — Telephone Encounter (Signed)
 Pt called to check on the status of order for scooter. Informed patient script has been sent to [facility]

## 2023-04-16 ENCOUNTER — Other Ambulatory Visit: Payer: Self-pay | Admitting: Family Medicine

## 2023-04-16 DIAGNOSIS — Z853 Personal history of malignant neoplasm of breast: Secondary | ICD-10-CM

## 2023-04-20 ENCOUNTER — Encounter: Payer: Self-pay | Admitting: Neurology

## 2023-04-20 ENCOUNTER — Telehealth: Payer: Self-pay | Admitting: Neurology

## 2023-04-20 ENCOUNTER — Other Ambulatory Visit: Payer: Self-pay | Admitting: *Deleted

## 2023-04-20 NOTE — Telephone Encounter (Signed)
 Called [facility] at [phone] and spoke [place].   They received order for scooter sent 02/25/23. Does not see any notes on order. She is sending communication to Debbie/rehab department, who handles these orders and will ask her to f/u with our office today or tomorrow with an update.  Total time of call: 17 min

## 2023-04-20 NOTE — Telephone Encounter (Signed)
 Pt left a vm this morning asking to be called with the status of her request for a replacement scooter.  Pt states it is to a point that she is having to charge her current scooter twice a day, please call pt to discuss.

## 2023-04-20 NOTE — Telephone Encounter (Signed)
 Pt sent mychart, I responded through this.

## 2023-04-22 ENCOUNTER — Encounter: Payer: Self-pay | Admitting: Neurology

## 2023-04-23 ENCOUNTER — Encounter: Payer: Self-pay | Admitting: Neurology

## 2023-05-04 ENCOUNTER — Encounter: Payer: Self-pay | Admitting: Neurology

## 2023-05-05 ENCOUNTER — Telehealth: Payer: Self-pay | Admitting: Neurology

## 2023-05-05 ENCOUNTER — Ambulatory Visit
Admission: RE | Admit: 2023-05-05 | Discharge: 2023-05-05 | Disposition: A | Discharge status: Home / Self Care | Source: Ambulatory Visit | Attending: Family Medicine | Admitting: Family Medicine

## 2023-05-05 DIAGNOSIS — Z853 Personal history of malignant neoplasm of breast: Secondary | ICD-10-CM

## 2023-05-05 NOTE — Telephone Encounter (Signed)
 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 Contact Authorization for Clinical Services [id number]

## 2023-05-06 NOTE — Telephone Encounter (Signed)
 Called and spoke w/ pt. Relayed mychart message. Pt verbalized understanding and will reach back out to [facility], confirmed she has her contact info. Nothing further needed.

## 2023-05-10 DIAGNOSIS — Z0289 Encounter for other administrative examinations: Secondary | ICD-10-CM

## 2023-05-11 ENCOUNTER — Telehealth: Payer: Self-pay | Admitting: *Deleted

## 2023-05-11 NOTE — Telephone Encounter (Signed)
 Pt [place] form faxed to [phone]

## 2023-05-11 NOTE — Telephone Encounter (Signed)
 Gave completed/signed form back to medical records to process for pt.

## 2023-05-20 ENCOUNTER — Encounter: Payer: Self-pay | Admitting: Neurology

## 2023-05-21 NOTE — Telephone Encounter (Signed)
 Pt just sending as FYI

## 2023-05-28 DIAGNOSIS — S93402A Sprain of unspecified ligament of left ankle, initial encounter: Secondary | ICD-10-CM | POA: Insufficient documentation

## 2023-05-28 DIAGNOSIS — M79662 Pain in left lower leg: Secondary | ICD-10-CM | POA: Insufficient documentation

## 2023-05-28 DIAGNOSIS — M25579 Pain in unspecified ankle and joints of unspecified foot: Secondary | ICD-10-CM | POA: Insufficient documentation

## 2023-05-31 ENCOUNTER — Other Ambulatory Visit: Payer: Self-pay | Admitting: Orthopaedic Surgery

## 2023-05-31 DIAGNOSIS — M79662 Pain in left lower leg: Secondary | ICD-10-CM

## 2023-06-10 ENCOUNTER — Encounter: Payer: Self-pay | Admitting: Neurology

## 2023-06-15 ENCOUNTER — Other Ambulatory Visit: Payer: Self-pay | Admitting: Neurology

## 2023-06-16 NOTE — Telephone Encounter (Signed)
 Last seen on 12/29/22 Follow up scheduled on 07/13/23   It appears tizanidine  was filled on 04/26/23 #90 tablets    Dispensed Days Supply Quantity Provider Pharmacy  CLONAZEPAM  1 MG TABLET 01/19/2023 90 360 each Sater, Sherida Dimmer, MD [phone]      Rx pending to be signed for clonazepam  1 mg.

## 2023-06-17 DIAGNOSIS — S82292A Other fracture of shaft of left tibia, initial encounter for closed fracture: Secondary | ICD-10-CM | POA: Insufficient documentation

## 2023-07-06 DIAGNOSIS — G929 Unspecified toxic encephalopathy: Secondary | ICD-10-CM | POA: Insufficient documentation

## 2023-07-12 ENCOUNTER — Telehealth: Payer: Self-pay | Admitting: Neurology

## 2023-07-12 NOTE — Telephone Encounter (Signed)
 request to cancel appointment, pt in hospital

## 2023-07-13 ENCOUNTER — Ambulatory Visit: Payer: Medicare Other | Admitting: Neurology

## 2023-07-14 ENCOUNTER — Ambulatory Visit

## 2023-07-27 DIAGNOSIS — R9389 Abnormal findings on diagnostic imaging of other specified body structures: Secondary | ICD-10-CM | POA: Insufficient documentation

## 2023-07-27 DIAGNOSIS — E7800 Pure hypercholesterolemia, unspecified: Secondary | ICD-10-CM | POA: Insufficient documentation

## 2023-07-27 DIAGNOSIS — I10 Essential (primary) hypertension: Secondary | ICD-10-CM | POA: Insufficient documentation

## 2023-07-27 DIAGNOSIS — E559 Vitamin D deficiency, unspecified: Secondary | ICD-10-CM | POA: Insufficient documentation

## 2023-07-27 DIAGNOSIS — G2581 Restless legs syndrome: Secondary | ICD-10-CM | POA: Insufficient documentation

## 2023-07-27 DIAGNOSIS — M533 Sacrococcygeal disorders, not elsewhere classified: Secondary | ICD-10-CM | POA: Insufficient documentation

## 2023-07-27 DIAGNOSIS — K7689 Other specified diseases of liver: Secondary | ICD-10-CM | POA: Insufficient documentation

## 2023-07-27 DIAGNOSIS — R198 Other specified symptoms and signs involving the digestive system and abdomen: Secondary | ICD-10-CM | POA: Insufficient documentation

## 2023-07-27 DIAGNOSIS — N3281 Overactive bladder: Secondary | ICD-10-CM | POA: Insufficient documentation

## 2023-07-27 DIAGNOSIS — R7989 Other specified abnormal findings of blood chemistry: Secondary | ICD-10-CM | POA: Insufficient documentation

## 2023-07-27 DIAGNOSIS — G801 Spastic diplegic cerebral palsy: Secondary | ICD-10-CM | POA: Insufficient documentation

## 2023-07-27 DIAGNOSIS — K769 Liver disease, unspecified: Secondary | ICD-10-CM | POA: Insufficient documentation

## 2023-07-27 DIAGNOSIS — E785 Hyperlipidemia, unspecified: Secondary | ICD-10-CM | POA: Insufficient documentation

## 2023-07-27 DIAGNOSIS — F324 Major depressive disorder, single episode, in partial remission: Secondary | ICD-10-CM | POA: Insufficient documentation

## 2023-07-27 DIAGNOSIS — M9390 Osteochondropathy, unspecified of unspecified site: Secondary | ICD-10-CM | POA: Insufficient documentation

## 2023-07-27 DIAGNOSIS — E041 Nontoxic single thyroid nodule: Secondary | ICD-10-CM | POA: Insufficient documentation

## 2023-07-27 DIAGNOSIS — K219 Gastro-esophageal reflux disease without esophagitis: Secondary | ICD-10-CM | POA: Insufficient documentation

## 2023-07-27 DIAGNOSIS — Z853 Personal history of malignant neoplasm of breast: Secondary | ICD-10-CM | POA: Insufficient documentation

## 2023-07-27 DIAGNOSIS — G629 Polyneuropathy, unspecified: Secondary | ICD-10-CM | POA: Insufficient documentation

## 2023-08-09 ENCOUNTER — Encounter: Payer: Self-pay | Admitting: Neurology

## 2023-08-09 ENCOUNTER — Ambulatory Visit (INDEPENDENT_AMBULATORY_CARE_PROVIDER_SITE_OTHER): Admitting: Neurology

## 2023-08-09 VITALS — BP 139/81 | HR 73 | Ht 64.0 in | Wt 135.0 lb

## 2023-08-09 DIAGNOSIS — R269 Unspecified abnormalities of gait and mobility: Secondary | ICD-10-CM

## 2023-08-09 DIAGNOSIS — G35 Multiple sclerosis: Secondary | ICD-10-CM

## 2023-08-09 DIAGNOSIS — G801 Spastic diplegic cerebral palsy: Secondary | ICD-10-CM | POA: Diagnosis not present

## 2023-08-09 DIAGNOSIS — L989 Disorder of the skin and subcutaneous tissue, unspecified: Secondary | ICD-10-CM

## 2023-08-09 DIAGNOSIS — Z8781 Personal history of (healed) traumatic fracture: Secondary | ICD-10-CM

## 2023-08-09 DIAGNOSIS — R296 Repeated falls: Secondary | ICD-10-CM

## 2023-08-09 DIAGNOSIS — R350 Frequency of micturition: Secondary | ICD-10-CM

## 2023-08-09 MED ORDER — DOXEPIN HCL 10 MG PO CAPS: 10.0000 mg | ORAL_CAPSULE | Freq: Every day | ORAL | 3 refills | Status: DC

## 2023-08-09 NOTE — Progress Notes (Signed)
 [facility]  PATIENT: Taylor Hancock DOB: 1960/06/13  REFERRING CLINICIAN: Elsie Lesches   HISTORY FROM: Patient  REASON FOR VISIT: PPMS    HISTORICAL  CHIEF COMPLAINT:  Chief Complaint  Patient presents with   Follow-up    Pt in room 11.alone. Here for MS follow up. Pt was admitted to hospital on 6/20/2 for Toxic encephalopathy. Pt reports she had a couple of baby falls.      HISTORY OF PRESENT ILLNESS:  Taylor Hancock is a 63 year old woman with primary progressive multiple sclerosis diagnosed in 2005   Update 08/09/2023: MS:   She had confusion prompting an ED visit on 07/05/2023.   She had MRI that showed no acute findings.   She was felt to have encephalopathy due to changing timing of medications (taking all at the same time at night.   Additionally, she had a Proteus vulgaris UTI.  Ammonia and TSH were fine.  She has PPMS and is not on any DMT.  Due to her impairments, she is mostly homebound.    She is doing PT now.  She feels it is more beneficial than Home PT/OT.   Will be seeing [facility], was [facility]     Currently taking tizanidine  4 mg po tid and clonazepam  2 mg qHS.   These doses are reduced since her admission last month  She fell in October 2024 and went to the ED the next day .  She fell while transferring to the scooter and slipped.  She broke her left femur and surgery was going to be done.  More recently she ha a left tibia/fibular ankle fracture.  SHe sees Drs. Midge and [facility].   She just did a bone density test showing osteoporosis.     She is transferring independently and bears some weight on the right.    She uses stretchy bands to exercise.   She uses a power wheelchair now for indoors and scooter outdoors.     Both legs weak, left > right.   She also has spasticity  and it is better with higher dose of tizanidine .    She was unable to tolerate baclofen in the past.        She has urinary urgency and some  incontinence.  She no longer needs a Purewick and has a bedside commode.  She is back on Gemtesa   She sees Dr. Gwenith.     Other:  She has breast cancer followed by Dr. Odean.  She had  hypertension in the past but now is off med's and BP was actually milly low today.   She was recently diagnosed with DM (glucose was 167 and HgbA1c was 6.9).   She lost  weight and sugars are reportedly much better   MS History:  She had a [place]mite sign in the 1990's a few times and she felt her neck was weaker at that time.  However, gait was not affected at that time.  She was diagnosed with multiple sclerosis in 2005 after presenting with several years of worsening gait. Initially, she was diagnosed with relapsing remitting MS and was placed on Betaseron. She had difficulty tolerating Betaseron but did continue to take it. Her first MRI after the diagnosis was unchanged from her previous one. A couple years later she transferred her care to me. After review of her time course of disease, it was apparent that she had a progressive MS and not a relapsing form of  MS. Therefore, the Betaseron was discontinued. Due to extent of disability, Ocrelizumab was not used.  She remains off a DMT.    IMAGING MRI brain 12/13/2018 showed Moderate to severe atrophy, with progression since 2017.  White matter changes in the brain are stable since 2017. Noacute intracranial abnormality  MRI cervical spine 12/13/2018 sho wed stable spinal cord foci at C2-3, C4, and C5 similar to the prior study. No enhancing lesions.  REVIEW OF SYSTEMS:  Constitutional: No fevers, chills, sweats, or change in appetite.  Notes fatigue.   Has insomnia and sleepiness Eyes: No visual changes, double vision.  Notes light sensitivity and eye pain Ear, nose and throat: No hearing loss, ear pain, nasal congestion, sore throat.    Cardiovascular: No chest pain, palpitations Respiratory:  No shortness of breath at rest or with exertion.   No  wheezes GastrointestinaI: No nausea, vomiting, diarrhea, abdominal pain.  Occ fecal incontinence Genitourinary:  see above.    Musculoskeletal:  She has myalgias Integumentary: No rash, pruritus, skin lesions.   Edema in feet Neurological: as above Psychiatric: Notes depression and anxiety  Endocrine: No palpitations, diaphoresis, change in appetite, change in weigh or increased thirst Hematologic/Lymphatic:  No anemia, purpura, petechiae. Allergic/Immunologic: No itchy/runny eyes, nasal congestion, recent allergic reactions, rashes  ALLERGIES: Allergies  Allergen Reactions   Fluogen [Influenza Virus Vaccine]    Letrozole  Other (See Comments)    Suicidal ideation   Levaquin [Levofloxacin In D5w]    Ppa-Mma Express [Compleat] Nausea And Vomiting    Blood in vomit and back felt like it was breaking.    Zithromax [Azithromycin] Other (See Comments)    abd cramping   Latex Itching   Ultram [Tramadol Hcl] Anxiety    HOME MEDICATIONS: Outpatient Medications Prior to Visit  Medication Sig Dispense Refill   clonazePAM  (KLONOPIN ) 1 MG tablet TAKE 4 TABLETS (4 MG TOTAL) BY MOUTH AT BEDTIME. 360 tablet 0   cyclobenzaprine  (FLEXERIL ) 5 MG tablet One or two po qHS 60 tablet 5   tiZANidine  (ZANAFLEX ) 4 MG tablet Take 1 tablet (4 mg total) by mouth 4 (four) times daily. 120 tablet 5   cloNIDine (CATAPRES) 0.1 MG tablet Take 0.1 mg by mouth 2 (two) times daily as needed.     oxyCODONE -acetaminophen  (PERCOCET) 10-325 MG tablet Take 1 tablet by mouth 3 (three) times daily as needed. (Patient not taking: Reported on 01/19/2023)     No facility-administered medications prior to visit.    PAST MEDICAL HISTORY: Past Medical History:  Diagnosis Date   Anxiety    Breast cancer (HCC)    Complication of anesthesia    patient was awake during colonoscopy as drugs were not effective-reports that propofol  is an effective analgesic for her   Concussion    Depression    Family history of breast  cancer    Family history of ovarian cancer    Family history of pancreatic cancer    Fibromyalgia    History of radiation therapy 05/20/17- 06/17/17   Left Breast/ 40.05 Gy in 15 fractions, Left Breast Boost/ 10 Gy in 5 fractions.    Movement disorder    MS (multiple sclerosis) (HCC)    primary progressive MS   Multiple sclerosis (HCC)    Neuropathy    Osteoporosis    Personal history of radiation therapy    Vision abnormalities     PAST SURGICAL HISTORY: Past Surgical History:  Procedure Laterality Date   BREAST LUMPECTOMY     BREAST LUMPECTOMY WITH  RADIOACTIVE SEED AND SENTINEL LYMPH NODE BIOPSY Left 03/31/2017   Procedure: BREAST LUMPECTOMY WITH RADIOACTIVE SEED AND SENTINEL LYMPH NODE BIOPSY;  Surgeon: Aron Shoulders, MD;  Location: [facility] OR;  Service: General;  Laterality: Left;   FOOT SURGERY Left    FRACTURE SURGERY Left    hand   I & D EXTREMITY  05/30/2011   Procedure: IRRIGATION AND DEBRIDEMENT EXTREMITY;  Surgeon: Franky JONELLE Curia, MD;  Location: [facility];  Service: Orthopedics;;  Incision and drainage of open Proximal interphalangeal joint with closed reduction of Proximal interphalangeal  joint left long finger   KNEE ARTHROSCOPY      FAMILY HISTORY: Family History  Problem Relation Age of Onset   Hypertension Mother    Breast cancer Mother 24   Hypothyroidism Mother    Hyperlipidemia Father    Diabetes Father    Pancreatic cancer Father 49   Hypothyroidism Sister    Ovarian cancer Maternal Aunt        dx in her 100s   Parkinson's disease Maternal Uncle    Stroke Maternal Uncle    Breast cancer Maternal Grandmother 100   Breast cancer Maternal Aunt        dx under 50   Breast cancer Maternal Aunt        dx over 58s   Breast cancer Maternal Aunt        dx >50   Breast cancer Other    Hypothyroidism Brother    Breast cancer Cousin    Breast cancer Sister         PHYSICAL EXAM  Today's Vitals   08/09/23 1426  BP: 139/81  Pulse: 73  Weight: 135 lb (61.2  kg)  Height: 5' 4 (1.626 m)   Body mass index is 23.17 kg/m.    General: The patient is well-developed and well-nourished and in mild distress.  She has left greater than right pedal edema.  There is a sore at the left heel.  It is dark.  No surrounding redness.  No definite ulcer.   Neurologic Exam   Mental status: The patient is alert and oriented x 3 at the time of the examination. She has reduced concentration and had trouble coming up with words at times.     Cranial nerves: Extraocular movements are full.    Facial strength and sensation is normal. Trapezius strength is norma Palatal elevation. Tongue protrusion is midline. Hearing is symmetrically    Motor:  Muscle bulk is normal. She has moderately severe left and mild right increased tone. Strength is 4/5 in the right leg and 21 to 2/5 in left leg   Sensory: She has reduced vibration in her left leg.  Touch is more symmetric   Coordination: Cerebellar testing reveals reduced left left finger-nose-finger.   She has reduced heel-to-shin on the right and isunable to do on her left   Gait and station: Station is unstable and requires bilateral support.  She transfers to scooter independently.    Reflexes: Deep tendon reflexes are symmetric and normal in the arms but she has increased reflexes in both legs. Left > right, she has nonsustained clonus at the ankles, worse on the left.   Assessment and Plan: Multiple sclerosis, primary progressive (HCC)  Spastic diplegia (HCC)  History of femur fracture  Gait disorder  Frequent falls  Urinary frequency  Heel sore   1.   She will Continue PT, OT as outpatient 2.   She has primary progressive MS and is not  on a DMT.  She had breast cancer and is not an Ocrevus candidate (though at age 57, benefit is very minimal anyhow) 2.   Stay active and work with physical therapy.   3.   Take care with transfers.   We went over safety 4.   Has a sore on the left heel doses not look  like ulcer--- Advised to dress with tegaderm.  if worsens needs to see PCP or wound care   F/u in 6 months, sooner if new or worsening symptoms   40-minute office visit with the majority of the time spent face-to-face for history and physical, discussion/counseling and decision-making.  Additional time with record review and documentation.  This visit is part of a comprehensive longitudinal care medical relationship regarding the patients primary diagnosis of multiple sclerosis and related concerns.    Cap Massi A. Vear, MD, PhD 08/09/2023, 3:54 PM Certified in Neurology, Clinical Neurophysiology, Sleep Medicine, Pain Medicine and Neuroimaging  [facility] [address] [place], [place] [address] [phone]

## 2023-08-12 ENCOUNTER — Encounter: Payer: Self-pay | Admitting: Neurology

## 2023-08-24 ENCOUNTER — Other Ambulatory Visit: Payer: Self-pay | Admitting: Neurology

## 2023-08-24 ENCOUNTER — Encounter: Payer: Self-pay | Admitting: Neurology

## 2023-08-24 MED ORDER — MIRTAZAPINE 15 MG PO TABS: 15.0000 mg | ORAL_TABLET | Freq: Every day | ORAL | 5 refills | Status: DC

## 2023-08-25 ENCOUNTER — Other Ambulatory Visit: Payer: Self-pay | Admitting: Neurology

## 2023-08-25 ENCOUNTER — Other Ambulatory Visit: Payer: Self-pay

## 2023-08-25 NOTE — Telephone Encounter (Signed)
 Last seen on 08/09/23 Follow up scheduled on 03/20/24

## 2023-08-31 ENCOUNTER — Encounter: Payer: Self-pay | Admitting: Neurology

## 2023-09-06 ENCOUNTER — Encounter: Payer: Self-pay | Admitting: Neurology

## 2023-09-16 ENCOUNTER — Encounter: Payer: Self-pay | Admitting: Neurology

## 2023-09-22 ENCOUNTER — Encounter: Payer: Self-pay | Admitting: Neurology

## 2023-09-23 ENCOUNTER — Other Ambulatory Visit: Payer: Self-pay | Admitting: Neurology

## 2023-09-23 ENCOUNTER — Encounter: Payer: Self-pay | Admitting: Neurology

## 2023-09-23 NOTE — Telephone Encounter (Signed)
 Last seen on 08/09/23 Follow up scheduled 03/20/24  Pharmacy requesting a 90 day supply.

## 2023-09-23 NOTE — Telephone Encounter (Signed)
 Taylor Hancock it looks like patient called back to speak with you.

## 2023-09-23 NOTE — Telephone Encounter (Signed)
Patient returning phone call, would like a call back.

## 2023-09-27 ENCOUNTER — Encounter: Payer: Self-pay | Admitting: Neurology

## 2023-09-28 MED ORDER — CLONAZEPAM 1 MG PO TABS: 4.0000 mg | ORAL_TABLET | Freq: Every evening | ORAL | 0 refills | Status: DC

## 2023-09-28 MED ORDER — TIZANIDINE HCL 4 MG PO TABS: 4.0000 mg | ORAL_TABLET | Freq: Four times a day (QID) | ORAL | 5 refills | Status: AC

## 2023-09-28 NOTE — Telephone Encounter (Signed)
 Requested Prescriptions   Pending Prescriptions Disp Refills   clonazePAM  (KLONOPIN ) 1 MG tablet 360 tablet 0    Sig: Take 4 tablets (4 mg total) by mouth at bedtime.   tiZANidine  (ZANAFLEX ) 4 MG tablet 120 tablet 5    Sig: Take 1 tablet (4 mg total) by mouth 4 (four) times daily.   Last seen 08/09/23 Next appt 03/20/24 Dispenses   Dispensed Days Supply Quantity Provider Pharmacy  CLONAZEPAM  1 MG TABLET 06/17/2023 90 360 each Sater, Charlie LABOR, MD [facility] [phone] - [facility]...  CLONAZEPAM  1 MG TABLET 01/19/2023 90 360 each Sater, Charlie LABOR, MD [facility] [phone] - H...  CLONAZEPAM  1 MG TABLET 09/28/2022 90 360 each Sater, Charlie LABOR, MD [facility] [phone] - [facility]

## 2023-10-20 ENCOUNTER — Telehealth: Payer: Self-pay

## 2023-10-20 NOTE — Telephone Encounter (Signed)
 Received phone call from patient stating that she has a UTI and would like to get her osteoporosis shots. Pt educated that this was an oncology office and a PCP or urgent care will be able to assist for the UTI. Pt also educated that our office does give osteoporosis shots but she would need a referral to be seen by a provider here. Pt verbalized understanding and had no further questions. Pt appreciative of call back.

## 2023-11-02 ENCOUNTER — Telehealth: Payer: Self-pay | Admitting: Neurology

## 2023-11-02 NOTE — Telephone Encounter (Signed)
 Pt called wanting to know the update on her handicap placard. Pt states that it expires at the end of this month. Please advise.

## 2023-11-02 NOTE — Telephone Encounter (Signed)
Placed in pod for signature

## 2023-11-04 NOTE — Telephone Encounter (Signed)
 Placed in box up front for mail pick up.

## 2023-11-10 ENCOUNTER — Encounter: Payer: Self-pay | Admitting: Neurology

## 2023-11-12 ENCOUNTER — Encounter: Payer: Self-pay | Admitting: Neurology

## 2023-11-15 ENCOUNTER — Ambulatory Visit

## 2023-11-16 ENCOUNTER — Telehealth: Payer: Self-pay | Admitting: *Deleted

## 2023-11-16 NOTE — Telephone Encounter (Signed)
 Faxed signed order/last OV note to [employer] [facility] [employer], received fax confirmation.

## 2023-11-22 ENCOUNTER — Encounter: Payer: Self-pay | Admitting: Cardiology

## 2023-11-22 ENCOUNTER — Other Ambulatory Visit: Payer: Self-pay | Admitting: *Deleted

## 2023-11-22 ENCOUNTER — Ambulatory Visit: Attending: Cardiology | Admitting: Cardiology

## 2023-11-22 ENCOUNTER — Encounter: Payer: Self-pay | Admitting: *Deleted

## 2023-11-22 VITALS — BP 160/80 | HR 72 | Ht 64.0 in | Wt 130.0 lb

## 2023-11-22 DIAGNOSIS — R072 Precordial pain: Secondary | ICD-10-CM | POA: Diagnosis not present

## 2023-11-22 DIAGNOSIS — E119 Type 2 diabetes mellitus without complications: Secondary | ICD-10-CM | POA: Insufficient documentation

## 2023-11-22 DIAGNOSIS — I10 Essential (primary) hypertension: Secondary | ICD-10-CM | POA: Insufficient documentation

## 2023-11-22 DIAGNOSIS — I2510 Atherosclerotic heart disease of native coronary artery without angina pectoris: Secondary | ICD-10-CM | POA: Diagnosis not present

## 2023-11-22 DIAGNOSIS — Z7401 Bed confinement status: Secondary | ICD-10-CM | POA: Insufficient documentation

## 2023-11-22 DIAGNOSIS — M8580 Other specified disorders of bone density and structure, unspecified site: Secondary | ICD-10-CM | POA: Insufficient documentation

## 2023-11-22 DIAGNOSIS — B354 Tinea corporis: Secondary | ICD-10-CM | POA: Insufficient documentation

## 2023-11-22 DIAGNOSIS — E7800 Pure hypercholesterolemia, unspecified: Secondary | ICD-10-CM | POA: Diagnosis not present

## 2023-11-22 DIAGNOSIS — R9389 Abnormal findings on diagnostic imaging of other specified body structures: Secondary | ICD-10-CM | POA: Insufficient documentation

## 2023-11-22 MED ORDER — ASPIRIN 81 MG PO TBEC: 81.0000 mg | DELAYED_RELEASE_TABLET | Freq: Every day | ORAL | Status: AC

## 2023-11-22 MED ORDER — METOPROLOL TARTRATE 100 MG PO TABS: ORAL_TABLET | ORAL | 0 refills | Status: DC

## 2023-11-22 NOTE — Progress Notes (Signed)
 Cardiology Consultation:    Date:  11/22/2023   ID:  [name], DOB 27-Jan-1960, MRN [id number]  PCP:  Arloa Elsie SAUNDERS, MD  Cardiologist:  Lamar Fitch, MD   Referring MD: Arloa Elsie SAUNDERS, MD   No chief complaint on file.   History of Present Illness:    Taylor Hancock is a 63 y.o. female who is being seen today for the evaluation of high-grade calcifications coronary arteries at the request of Arloa Elsie SAUNDERS, MD. past medical history significant for longstanding multiple sclerosis, depression, history of breast cancer.  She was referred to us  because a CT of the chest done show calcification of the coronary arteries.  She lives a very sedentary lifestyle because of multiple sclerosis she cannot walk she is on electric child driving around.  She also sustains a fracture on big bone in the left leg.  Denies have any chest pain tightness squeezing pressure burning chest does not have much shortness of breath but again her life is very sedentary is difficult to assess it.  She smoked long time ago but quit, does have high cholesterol does not want to take any statin does have family history of coronary artery disease but not premature.  Does not do any exercises, not on any special diet  Past Medical History:  Diagnosis Date   Anxiety    Breast cancer (HCC)    Complication of anesthesia    patient was awake during colonoscopy as drugs were not effective-reports that propofol  is an effective analgesic for her   Concussion    Depression    Family history of breast cancer    Family history of ovarian cancer    Family history of pancreatic cancer    Fibromyalgia    History of radiation therapy 05/20/17- 06/17/17   Left Breast/ 40.05 Gy in 15 fractions, Left Breast Boost/ 10 Gy in 5 fractions.    Movement disorder    MS (multiple sclerosis)    primary progressive MS   Multiple sclerosis    Neuropathy    Osteoporosis    Personal history of radiation therapy    Vision  abnormalities     Past Surgical History:  Procedure Laterality Date   BREAST LUMPECTOMY     BREAST LUMPECTOMY WITH RADIOACTIVE SEED AND SENTINEL LYMPH NODE BIOPSY Left 03/31/2017   Procedure: BREAST LUMPECTOMY WITH RADIOACTIVE SEED AND SENTINEL LYMPH NODE BIOPSY;  Surgeon: Aron Shoulders, MD;  Location: [facility];  Service: General;  Laterality: Left;   FOOT SURGERY Left    FRACTURE SURGERY Left    hand   I & D EXTREMITY  05/30/2011   Procedure: IRRIGATION AND DEBRIDEMENT EXTREMITY;  Surgeon: Franky SAUNDERS Curia, MD;  Location: [facility];  Service: Orthopedics;;  Incision and drainage of open Proximal interphalangeal joint with closed reduction of Proximal interphalangeal  joint left long finger   KNEE ARTHROSCOPY      Current Medications: Current Meds  Medication Sig   aspirin EC 81 MG tablet Take 1 tablet (81 mg total) by mouth daily. Swallow whole.   clonazePAM  (KLONOPIN ) 1 MG tablet Take 4 tablets (4 mg total) by mouth at bedtime.   GEMTESA 75 MG TABS Take 1 tablet by mouth daily.   metoprolol tartrate (LOPRESSOR) 100 MG tablet Take one tablet 2 hours before cardiac CT for heart greater than 55   tiZANidine  (ZANAFLEX ) 4 MG tablet Take 1 tablet (4 mg total) by mouth 4 (four) times daily.     Allergies:  Tramadol hcl, Compleat, Doxycycline , Fluogen [influenza virus vaccine], Haemophilus b polysaccharide vaccine, Letrozole , Levaquin [levofloxacin in d5w], Methylprednisolone, Paroxetine, Phenylpropanolamine, Ppa-mma express [compleat], Zithromax [azithromycin], and Latex   Social History   Socioeconomic History   Marital status: Married    Spouse name: Not on file   Number of children: Not on file   Years of education: Not on file   Highest education level: Not on file  Occupational History   Not on file  Tobacco Use   Smoking status: Former    Current packs/day: 0.00    Types: Cigarettes    Quit date: 11/25/2012    Years since quitting: 10.9   Smokeless tobacco: Never  Vaping Use    Vaping status: Never Used  Substance and Sexual Activity   Alcohol use: Not Currently    Comment: 12 alcohol drinks weekly   Drug use: No   Sexual activity: Not on file  Other Topics Concern   Not on file  Social History Narrative   Not on file   Social Drivers of Health   Financial Resource Strain: Not on file  Food Insecurity: Low Risk  (07/10/2023)   Received from [facility]   Hunger Vital Sign    Within the past 12 months, you worried that your food would run out before you got money to buy more: Never true    Within the past 12 months, the food you bought just didn't last and you didn't have money to get more. : Never true  Transportation Needs: No Transportation Needs (07/15/2023)   Received from [facility]   PRAPARE - Transportation    In the past 12 months, has lack of transportation kept you from medical appointments or from getting medications?: No    In the past 12 months, has lack of transportation kept you from meetings, work, or from getting things needed for daily living?: No  Physical Activity: Not on file  Stress: Not on file  Social Connections: Unknown (05/02/2022)   Received from [employer]   Social Network    Social Network: Not on file     Family History: The patient's family history includes Breast cancer in her cousin, maternal aunt, maternal aunt, maternal aunt, sister, and another family member; Breast cancer (age of onset: 26) in her maternal grandmother; Breast cancer (age of onset: 64) in her mother; Diabetes in her father; Hyperlipidemia in her father; Hypertension in her mother; Hypothyroidism in her brother, mother, and sister; Ovarian cancer in her maternal aunt; Pancreatic cancer (age of onset: 45) in her father; Parkinson's disease in her maternal uncle; Stroke in her maternal uncle. ROS:   Please see the history of present illness.    All 14 point review of systems negative except as described per history of present  illness.  EKGs/Labs/Other Studies Reviewed:    The following studies were reviewed today:   EKG:  EKG Interpretation Date/Time:  Monday November 22 2023 15:39:30 EST Ventricular Rate:  72 PR Interval:  126 QRS Duration:  82 QT Interval:  370 QTC Calculation: 405 R Axis:   1  Text Interpretation: Sinus rhythm with Premature atrial complexes Nonspecific T wave abnormality When compared with ECG of 13-Dec-2018 08:50, PREVIOUS ECG IS PRESENT Confirmed by Bernie Charleston [id number]) on 11/22/2023 3:43:16 PM    Recent Labs: No results found for requested labs within last 365 days.  Recent Lipid Panel No results found for: CHOL, TRIG, HDL, CHOLHDL, VLDL, LDLCALC, LDLDIRECT  Physical Exam:    VS:  BP (!) 160/80   Pulse 72   Ht 5' 4 (1.626 m)   Wt 130 lb (59 kg) Comment: Verbal  SpO2 98%   BMI 22.31 kg/m     Wt Readings from Last 3 Encounters:  11/22/23 130 lb (59 kg)  08/09/23 135 lb (61.2 kg)  11/09/22 135 lb (61.2 kg)     GEN:  Well nourished, well developed in no acute distress HEENT: Normal NECK: No JVD; No carotid bruits LYMPHATICS: No lymphadenopathy CARDIAC: RRR, no murmurs, no rubs, no gallops RESPIRATORY:  Clear to auscultation without rales, wheezing or rhonchi  ABDOMEN: Soft, non-tender, non-distended MUSCULOSKELETAL:  No edema; No deformity  SKIN: Warm and dry NEUROLOGIC:  Alert and oriented x 3 PSYCHIATRIC:  Normal affect   ASSESSMENT:    1. Essential hypertension   2. Precordial pain   3. Coronary artery calcification   4. Pure hypercholesterolemia    PLAN:    In order of problems listed above:  Calcification of the coronary arteries.  I think we need to determine if there is any obstruction of caused by that calcifications.  She does not have any symptoms but her exercises only minimal I will schedule him to have coronary CT angio make sure she does not have any significant stenosis.  In the meantime ask her to start taking baby aspirin  every day.  Essential hypertension elevated today but it is a first visit to my office will continue monitoring. Dyslipidemia I had a great conversation with her about potentially taking medication for which she is open for at the suggestion.  Her LDL 179 HDL 54 this is from March of this year clearly not well-controlled.  However she said she want to wait for results of test before she decide about taking this medication we were talking about rosuvastatin Multiple sclerosis, noted, slowly progressing   Medication Adjustments/Labs and Tests Ordered: Current medicines are reviewed at length with the patient today.  Concerns regarding medicines are outlined above.  Orders Placed This Encounter  Procedures   CT CORONARY MORPH W/CTA COR W/SCORE W/CA W/CM &/OR WO/CM   EKG 12-Lead   Meds ordered this encounter  Medications   aspirin EC 81 MG tablet    Sig: Take 1 tablet (81 mg total) by mouth daily. Swallow whole.   metoprolol tartrate (LOPRESSOR) 100 MG tablet    Sig: Take one tablet 2 hours before cardiac CT for heart greater than 55    Dispense:  1 tablet    Refill:  0    Signed, Lamar DOROTHA Fitch, MD, [facility]. 11/22/2023 4:40 PM    [facility]

## 2023-11-22 NOTE — Patient Instructions (Addendum)
 Medication Instructions:   START: Aspirin 81mg  1 tablet daily  TAKE: Metoprolol 100mg  1 tablet 2 hours prior to CT Scan   Lab Work: None Ordered If you have labs (blood work) drawn today and your tests are completely normal, you will receive your results only by: MyChart Message (if you have MyChart) OR A paper copy in the mail If you have any lab test that is abnormal or we need to change your treatment, we will call you to review the results.   Testing/Procedures:  Your cardiac CT will be scheduled at one of the below locations:   [facility] [address] [place], [place] [address]  Please follow these instructions carefully (unless otherwise directed):    On the Night Before the Test: Be sure to Drink plenty of water. Do not consume any caffeinated/decaffeinated beverages or chocolate 12 hours prior to your test. Do not take any antihistamines 12 hours prior to your test.   On the Day of the Test: Drink plenty of water until 1 hour prior to the test. Do not eat any food 4 hours prior to the test. You may take your regular medications prior to the test.  Take metoprolol (Lopressor) two hours prior to test. FEMALES- please wear underwire-free bra if available, avoid dresses & tight clothing       After the Test: Drink plenty of water. After receiving IV contrast, you may experience a mild flushed feeling. This is normal. On occasion, you may experience a mild rash up to 24 hours after the test. This is not dangerous. If this occurs, you can take Benadryl 25 mg and increase your fluid intake. If you experience trouble breathing, this can be serious. If it is severe call 911 IMMEDIATELY. If it is mild, please call our office. If you take any of these medications: Glipizide/Metformin, Avandament, Glucavance, please do not take 48 hours after completing test unless otherwise instructed.  We will call to schedule your test 2-4 weeks out understanding that some  insurance companies will need an authorization prior to the service being performed.   For non-scheduling related questions, please contact the cardiac imaging nurse navigator should you have any questions/concerns: Camie Shutter, Cardiac Imaging Nurse Navigator Chantal Requena, Cardiac Imaging Nurse Navigator [facility] Direct Office Dial: [phone]   For scheduling needs, including cancellations and rescheduling, please call Brittany, [phone].    Follow-Up: At [facility], you and your health needs are our priority.  As part of our continuing mission to provide you with exceptional heart care, we have created designated Provider Care Teams.  These Care Teams include your primary Cardiologist (physician) and Advanced Practice Providers (APPs -  Physician Assistants and Nurse Practitioners) who all work together to provide you with the care you need, when you need it.  We recommend signing up for the patient portal called MyChart.  Sign up information is provided on this After Visit Summary.  MyChart is used to connect with patients for Virtual Visits (Telemedicine).  Patients are able to view lab/test results, encounter notes, upcoming appointments, etc.  Non-urgent messages can be sent to your provider as well.   To learn more about what you can do with MyChart, go to [email or web address].    Your next appointment:   2 month(s)  The format for your next appointment:   In Person  Provider:   Lamar Fitch, MD    Other Instructions NA

## 2023-11-23 ENCOUNTER — Encounter: Payer: Self-pay | Admitting: Cardiology

## 2023-12-09 ENCOUNTER — Encounter (HOSPITAL_COMMUNITY): Payer: Self-pay

## 2023-12-14 ENCOUNTER — Ambulatory Visit (HOSPITAL_BASED_OUTPATIENT_CLINIC_OR_DEPARTMENT_OTHER)
Admission: RE | Admit: 2023-12-14 | Discharge: 2023-12-14 | Disposition: A | Discharge status: Home / Self Care | Source: Ambulatory Visit | Attending: Cardiology | Admitting: Cardiology

## 2023-12-14 ENCOUNTER — Encounter (HOSPITAL_BASED_OUTPATIENT_CLINIC_OR_DEPARTMENT_OTHER): Payer: Self-pay

## 2023-12-14 ENCOUNTER — Encounter: Payer: Self-pay | Admitting: Cardiology

## 2023-12-14 DIAGNOSIS — R072 Precordial pain: Secondary | ICD-10-CM | POA: Insufficient documentation

## 2023-12-14 LAB — POCT I-STAT CREATININE: Creatinine, Ser: 0.7 mg/dL (ref 0.44–1.00)

## 2023-12-14 MED ORDER — NITROGLYCERIN 0.4 MG SL SUBL
0.8000 mg | SUBLINGUAL_TABLET | Freq: Once | SUBLINGUAL | Status: AC
  Administered 2023-12-14: 0.8 mg via SUBLINGUAL

## 2023-12-14 MED ORDER — IOHEXOL 350 MG/ML SOLN
100.0000 mL | Freq: Once | INTRAVENOUS | Status: AC | PRN
  Administered 2023-12-14: 95 mL via INTRAVENOUS

## 2023-12-19 ENCOUNTER — Encounter: Payer: Self-pay | Admitting: Neurology

## 2023-12-20 MED ORDER — CLONAZEPAM 1 MG PO TABS: 4.0000 mg | ORAL_TABLET | Freq: Every evening | ORAL | 0 refills | Status: AC

## 2023-12-20 NOTE — Telephone Encounter (Signed)
 Requested Prescriptions   Pending Prescriptions Disp Refills   clonazePAM  (KLONOPIN ) 1 MG tablet 360 tablet 0    Sig: Take 4 tablets (4 mg total) by mouth at bedtime.   Last seen 08/09/23 Next appt 03/20/24 Dispenses   Dispensed Days Supply Quantity Provider Pharmacy  CLONAZEPAM  1 MG TABLET 09/28/2023 90 360 each Sater, Charlie LABOR, MD [facility] [phone] - H...  CLONAZEPAM  1 MG TABLET 06/17/2023 90 360 each Sater, Charlie LABOR, MD [facility] [phone] - H...  CLONAZEPAM  1 MG TABLET 01/19/2023 90 360 each Sater, Charlie LABOR, MD [facility] [phone] - [facility]

## 2023-12-24 ENCOUNTER — Telehealth: Payer: Self-pay

## 2023-12-24 ENCOUNTER — Ambulatory Visit: Payer: Self-pay

## 2023-12-24 ENCOUNTER — Ambulatory Visit: Payer: Self-pay | Admitting: Cardiology

## 2023-12-24 NOTE — Telephone Encounter (Signed)
 Left message on My Chart with CT angio results per Dr. Tonja Fray note. Routed to PCP.

## 2023-12-27 ENCOUNTER — Other Ambulatory Visit: Payer: Self-pay

## 2023-12-27 ENCOUNTER — Emergency Department (HOSPITAL_BASED_OUTPATIENT_CLINIC_OR_DEPARTMENT_OTHER)
Admission: EM | Admit: 2023-12-27 | Discharge: 2023-12-27 | Disposition: A | Discharge status: Home / Self Care | Attending: Emergency Medicine | Admitting: Emergency Medicine

## 2023-12-27 DIAGNOSIS — H5713 Ocular pain, bilateral: Secondary | ICD-10-CM | POA: Diagnosis present

## 2023-12-27 DIAGNOSIS — Z7982 Long term (current) use of aspirin: Secondary | ICD-10-CM | POA: Insufficient documentation

## 2023-12-27 DIAGNOSIS — H1032 Unspecified acute conjunctivitis, left eye: Secondary | ICD-10-CM | POA: Diagnosis not present

## 2023-12-27 DIAGNOSIS — H5789 Other specified disorders of eye and adnexa: Secondary | ICD-10-CM

## 2023-12-27 DIAGNOSIS — E119 Type 2 diabetes mellitus without complications: Secondary | ICD-10-CM | POA: Insufficient documentation

## 2023-12-27 DIAGNOSIS — Z91040 Latex allergy status: Secondary | ICD-10-CM | POA: Insufficient documentation

## 2023-12-27 LAB — RESP PANEL BY RT-PCR (RSV, FLU A&B, COVID)  RVPGX2
Influenza A by PCR: NEGATIVE
Influenza B by PCR: NEGATIVE
Resp Syncytial Virus by PCR: NEGATIVE
SARS Coronavirus 2 by RT PCR: NEGATIVE

## 2023-12-27 MED ORDER — TETRACAINE HCL 0.5 % OP SOLN
2.0000 [drp] | Freq: Once | OPHTHALMIC | Status: AC
  Administered 2023-12-27: 2 [drp] via OPHTHALMIC
  Filled 2023-12-27: qty 4

## 2023-12-27 MED ORDER — FLUORESCEIN SODIUM 1 MG OP STRP
1.0000 | ORAL_STRIP | Freq: Once | OPHTHALMIC | Status: AC
  Administered 2023-12-27: 1 via OPHTHALMIC
  Filled 2023-12-27: qty 1

## 2023-12-27 MED ORDER — CIPROFLOXACIN HCL 0.3 % OP SOLN
2.0000 [drp] | Freq: Once | OPHTHALMIC | Status: AC
  Administered 2023-12-27: 2 [drp] via OPHTHALMIC
  Filled 2023-12-27: qty 2.5

## 2023-12-27 MED ORDER — CIPROFLOXACIN HCL 0.3 % OP SOLN: 1.0000 [drp] | OPHTHALMIC | 0 refills | Status: AC

## 2023-12-27 NOTE — Discharge Instructions (Addendum)
 It was a pleasure taking care of you today.  Based on your history and physical exam I feel you are stable for discharge.  The most likely diagnosis based off your exam today is conjunctivitis.  Because of this you have been prescribed some antibiotic eyedrops.  Please pick these up from the pharmacy and take as prescribed.  Please do not wear your contacts for the duration of treatment.  Due to your significant past medical history and the questions about your Prolia injection I do recommend that you follow-up with your ophthalmologist as soon as possible, preferably this week, sooner if symptoms warrant.  Please continue to monitor your symptoms, if you develop fever, chills, redness or swelling around the eye, visual disturbances including blurry vision, or other concerning symptom please return to the emergency department or seek further medical care.  Please make your primary care provider as well as your eye doctor aware of your visit today and findings as well as your new prescription.

## 2023-12-27 NOTE — ED Triage Notes (Signed)
 Pt reports new eye discomfort with drainage x 3 days.  Started with L eye, progressed to both eyes.   Also c/o L sinus congestion for same time.

## 2023-12-27 NOTE — ED Provider Notes (Cosign Needed Addendum)
 [facility] Provider Note   CSN: [id number] Arrival date & time: 12/27/23  1123     Patient presents with: Eye Drainage   Taylor Hancock is a 63 y.o. female who presents to the emergency department with a chief complaint of bilateral eye pain and eye redness.  The majority of the pain and redness is from the left eye.  Patient states that her symptoms started on Friday (2 days ago) and has persisted.  Patient states that she has been congested and also had a mild amount of sinus drainage.  Denies fever, chills.  Denies significant visual disturbances including curtain closing sensation, seeing black spots.  Patient states that she does wear contacts but only in her left eye to assist with her vision, otherwise she does wear glasses.  Patient does have an eye doctor that she is established with.  Denies known possibility of corneal abrasion but states that I did feel like I had something in my eye a few days ago.  Patient states that the pain and redness started in the left eye but now she feels like she is starting to develop it in the right eye as well.  Patient does have a past medical history significant for multiple sclerosis, foot drop, GERD, depression, osteopenia on Prolia, type 2 diabetes, spastic diplegic cerebral palsy, etc. Patient received the prolia injection earlier this year and has had a few other side effects from the injection and was wondering if her eye symptoms could be a side effect of the injection.    HPI     Prior to Admission medications   Medication Sig Start Date End Date Taking? Authorizing Provider  ciprofloxacin  (CILOXAN ) 0.3 % ophthalmic solution Place 1-2 drops into both eyes every 2 (two) hours while awake. Administer 1 drop, every 2 hours, while awake, for 2 days. Then 1 drop, every 4 hours, while awake, for the next 5 days. 12/27/23  Yes Melodee Lupe F, PA-C  aspirin  EC 81 MG tablet Take 1 tablet (81 mg total)  by mouth daily. Swallow whole. 11/22/23   Krasowski, Robert J, MD  clonazePAM  (KLONOPIN ) 1 MG tablet Take 4 tablets (4 mg total) by mouth at bedtime. 12/20/23   Sater, Charlie LABOR, MD  GEMTESA 75 MG TABS Take 1 tablet by mouth daily.    [provider]  metoprolol  tartrate (LOPRESSOR ) 100 MG tablet Take one tablet 2 hours before cardiac CT for heart greater than 55 11/22/23   Bernie Lamar PARAS, MD  tiZANidine  (ZANAFLEX ) 4 MG tablet Take 1 tablet (4 mg total) by mouth 4 (four) times daily. 09/28/23   Sater, Charlie LABOR, MD    Allergies: Tramadol hcl, Compleat, Doxycycline , Fluogen [influenza virus vaccine], Haemophilus b polysaccharide vaccine, Letrozole , Levaquin [levofloxacin in d5w], Methylprednisolone, Paroxetine, Phenylpropanolamine, Ppa-mma express [compleat], Zithromax [azithromycin], and Latex    Review of Systems  Eyes:  Positive for redness.    Updated Vital Signs BP 135/73   Pulse 69   Temp 98.2 F (36.8 C) (Oral)   Resp 17   Ht 5' 4 (1.626 m)   Wt 59 kg   SpO2 100%   BMI 22.31 kg/m   Physical Exam Vitals and nursing note reviewed.  Constitutional:      General: She is awake. She is not in acute distress.    Appearance: Normal appearance. She is not ill-appearing, toxic-appearing or diaphoretic.     Comments: Patient resting comfortably on scooter assistive device which is  her baseline  HENT:     Head: Normocephalic and atraumatic.  Eyes:     General: Lids are normal. Lids are everted, no foreign bodies appreciated. Vision grossly intact. Gaze aligned appropriately. No visual field deficit or scleral icterus.       Right eye: No foreign body or discharge.        Left eye: No foreign body or discharge.     Extraocular Movements: Extraocular movements intact.     Right eye: Normal extraocular motion and no nystagmus.     Left eye: Normal extraocular motion and no nystagmus.     Pupils: Pupils are equal, round, and reactive to light.     Comments: Left conjunctiva  injected, no erythema of skin surrounding eye or extensive swelling surrounding eye including lid  Pulmonary:     Effort: Pulmonary effort is normal. No respiratory distress.  Musculoskeletal:     Cervical back: Normal range of motion.  Skin:    General: Skin is warm.     Capillary Refill: Capillary refill takes less than 2 seconds.  Neurological:     General: No focal deficit present.     Mental Status: She is alert and oriented to person, place, and time.  Psychiatric:        Mood and Affect: Mood normal.        Behavior: Behavior normal. Behavior is cooperative.    *Yellow/green surrounding eye in image is fluorescein  stain*  (all labs ordered are listed, but only abnormal results are displayed) Labs Reviewed  RESP PANEL BY RT-PCR (RSV, FLU A&B, COVID)  [id number]    EKG: None  Radiology: No results found.   Procedures   Medications Ordered in the ED  tetracaine  (PONTOCAINE) 0.5 % ophthalmic solution 2 drop (2 drops Both Eyes Given by Other 12/27/23 1515)  fluorescein  ophthalmic strip 1 strip (1 strip Both Eyes Given by Other 12/27/23 1516)  ciprofloxacin  (CILOXAN ) 0.3 % ophthalmic solution 2 drop (2 drops Both Eyes Given 12/27/23 1513)                                    Medical Decision Making Risk Prescription drug management.   Patient presents to the ED for concern of left eye pain and redness this involves an extensive number of treatment options, and is a complaint that carries with it a high risk of complications and morbidity.  The differential diagnosis includes bacterial, viral, allergic conjunctivitis, scleritis, glaucoma, cataracts, etc.   Co morbidities that complicate the patient evaluation  multiple sclerosis, foot drop, GERD, depression, osteopenia on Prolia, type 2 diabetes, spastic diplegic cerebral palsy   Lab Tests:  I Ordered, and personally interpreted labs.  The pertinent results include: Respiratory panel negative *this test was ordered from  triage*   Medicines ordered and prescription drug management:  I ordered medication including tetracaine  numbing drops, fluorescein  staining ophthalmic strip for eye staining, ciprofloxacin  drops for conjunctivitis Reevaluation of the patient after these medicines showed that the patient improved I have reviewed the patients home medicines and have made adjustments as needed   Test Considered:  Attempted to achieve tonopen pressures however was unable to obtain consistent reading, doubt acute increased intraocular pressure as symptoms appear to be becoming bilateral in nature, no visual disturbances, doubt acute glaucoma   Critical Interventions:  None   Problem List / ED Course:  63 year old female, vital signs stable, presents to the  emergency department with a chief complaint of left eye redness and irritation that she believes is spreading to her right eye, symptoms started approximately 48 hours ago, patient does have some sinus symptoms, denies fever, chills, or significant visual disturbances, patient is established with an ophthalmologist, denies crusty discharge out of eyes On physical exam, pupils equal round reactive to light, EOMs intact, left conjunctiva generalized injection present, no significant photophobia during examination Vision grossly intact and visual acuity here in the emergency department was reassuring, no visual field deficit Possibly very small corneal abrasion present to left eye with staining, no evidence of corneal ulcer Overall reassuring exam, exam consistent with viral or allergic conjunctivitis, less likely bacterial due to absence of discharge.  Did start in left eye and then spread to right however Will treat with ciprofloxacin  ophthalmic drops as patient does wear contacts in her left eye, patient instructed to not wear contacts for the duration of treatment Based on research it does appear that conjunctivitis and other forms of inflammation of the  eye are possible with the Prolia injection however these side effects generally occur 2-3 days following injection, this patient received injection multiple months ago, at this time believe viral or allergic conjunctivitis is more likely  Return precautions given Patient discharged Most likely diagnosis at this time is uncomplicated conjunctivitis, no red flag eye symptoms   Reevaluation:  After the interventions noted above, I reevaluated the patient and found that they have :improved   Social Determinants of Health:  none   Dispostion:  After consideration of the diagnostic results and the patients response to treatment, I feel that the patient would benefit from discharge and outpatient therapy as described, follow-up with ophthalmology..       Final diagnoses:  Acute conjunctivitis of left eye, unspecified acute conjunctivitis type  Redness of left eye    ED Discharge Orders          Ordered    ciprofloxacin  (CILOXAN ) 0.3 % ophthalmic solution  Every 2 hours while awake        12/27/23 1522               [place], Terr[place] FALCON, PA-C 12/27/23 2241    Janetta Terrall FALCON, PA-C 12/27/23 2316

## 2023-12-29 ENCOUNTER — Telehealth: Payer: Self-pay

## 2023-12-29 NOTE — Telephone Encounter (Signed)
 Pt viewed CT Angio results on My Chart per Dr. Karry note. Routed to PCP.

## 2024-02-10 ENCOUNTER — Other Ambulatory Visit: Payer: Self-pay | Admitting: *Deleted

## 2024-02-16 ENCOUNTER — Ambulatory Visit: Admitting: Cardiology

## 2024-03-20 ENCOUNTER — Ambulatory Visit: Admitting: Neurology

## 2024-03-27 ENCOUNTER — Ambulatory Visit: Admitting: Neurology

## 2024-04-19 ENCOUNTER — Ambulatory Visit: Admitting: Cardiology
